# Patient Record
Sex: Female | Born: 1965 | Race: Black or African American | Hispanic: No | Marital: Married | Smoking: Never smoker
Health system: Southern US, Community
[De-identification: ages and names within clinical notes are randomized; demographics above are authoritative.]

## PROBLEM LIST (undated history)

## (undated) DIAGNOSIS — M25569 Pain in unspecified knee: Secondary | ICD-10-CM

## (undated) DIAGNOSIS — R011 Cardiac murmur, unspecified: Secondary | ICD-10-CM

## (undated) DIAGNOSIS — Z972 Presence of dental prosthetic device (complete) (partial): Secondary | ICD-10-CM

## (undated) DIAGNOSIS — Z8739 Personal history of other diseases of the musculoskeletal system and connective tissue: Secondary | ICD-10-CM

## (undated) DIAGNOSIS — E119 Type 2 diabetes mellitus without complications: Secondary | ICD-10-CM

## (undated) DIAGNOSIS — T7840XA Allergy, unspecified, initial encounter: Secondary | ICD-10-CM

## (undated) DIAGNOSIS — K219 Gastro-esophageal reflux disease without esophagitis: Secondary | ICD-10-CM

## (undated) DIAGNOSIS — R51 Headache: Secondary | ICD-10-CM

## (undated) DIAGNOSIS — I10 Essential (primary) hypertension: Secondary | ICD-10-CM

## (undated) DIAGNOSIS — M1990 Unspecified osteoarthritis, unspecified site: Secondary | ICD-10-CM

## (undated) DIAGNOSIS — S83249A Other tear of medial meniscus, current injury, unspecified knee, initial encounter: Secondary | ICD-10-CM

## (undated) DIAGNOSIS — J0391 Acute recurrent tonsillitis, unspecified: Secondary | ICD-10-CM

## (undated) DIAGNOSIS — H269 Unspecified cataract: Secondary | ICD-10-CM

## (undated) DIAGNOSIS — N736 Female pelvic peritoneal adhesions (postinfective): Secondary | ICD-10-CM

## (undated) HISTORY — PX: JOINT REPLACEMENT: SHX530

## (undated) HISTORY — DX: Headache: R51

## (undated) HISTORY — DX: Essential (primary) hypertension: I10

## (undated) HISTORY — PX: POLYPECTOMY: SHX149

## (undated) HISTORY — DX: Allergy, unspecified, initial encounter: T78.40XA

## (undated) HISTORY — PX: LYSIS OF ADHESION: SHX5961

## (undated) HISTORY — DX: Pain in unspecified knee: M25.569

## (undated) HISTORY — DX: Acute recurrent tonsillitis, unspecified: J03.91

## (undated) HISTORY — PX: HERNIA REPAIR: SHX51

## (undated) HISTORY — DX: Female pelvic peritoneal adhesions (postinfective): N73.6

---
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
## 1999-02-09 ENCOUNTER — Encounter: Payer: Self-pay | Admitting: Emergency Medicine

## 1999-02-09 ENCOUNTER — Emergency Department (HOSPITAL_COMMUNITY): Admission: EM | Admit: 1999-02-09 | Discharge: 1999-02-09 | Payer: Self-pay | Admitting: Emergency Medicine

## 1999-04-24 ENCOUNTER — Encounter: Payer: Self-pay | Admitting: Emergency Medicine

## 1999-04-24 ENCOUNTER — Emergency Department (HOSPITAL_COMMUNITY): Admission: EM | Admit: 1999-04-24 | Discharge: 1999-04-24 | Payer: Self-pay | Admitting: Emergency Medicine

## 1999-05-06 ENCOUNTER — Encounter: Admission: RE | Admit: 1999-05-06 | Discharge: 1999-05-21 | Payer: Self-pay | Admitting: Orthopedic Surgery

## 1999-08-05 ENCOUNTER — Other Ambulatory Visit: Admission: RE | Admit: 1999-08-05 | Discharge: 1999-08-05 | Payer: Self-pay | Admitting: Internal Medicine

## 1999-12-15 ENCOUNTER — Emergency Department (HOSPITAL_COMMUNITY): Admission: EM | Admit: 1999-12-15 | Discharge: 1999-12-15 | Payer: Self-pay

## 2000-07-31 ENCOUNTER — Encounter: Admission: RE | Admit: 2000-07-31 | Discharge: 2000-07-31 | Payer: Self-pay | Admitting: Family Medicine

## 2000-08-24 ENCOUNTER — Encounter: Admission: RE | Admit: 2000-08-24 | Discharge: 2000-08-24 | Payer: Self-pay | Admitting: Family Medicine

## 2000-09-18 ENCOUNTER — Encounter: Admission: RE | Admit: 2000-09-18 | Discharge: 2000-09-18 | Payer: Self-pay | Admitting: Family Medicine

## 2001-04-13 ENCOUNTER — Encounter: Admission: RE | Admit: 2001-04-13 | Discharge: 2001-04-13 | Payer: Self-pay | Admitting: Family Medicine

## 2001-05-05 ENCOUNTER — Encounter: Admission: RE | Admit: 2001-05-05 | Discharge: 2001-05-05 | Payer: Self-pay | Admitting: Family Medicine

## 2001-05-07 ENCOUNTER — Encounter: Payer: Self-pay | Admitting: Sports Medicine

## 2001-05-07 ENCOUNTER — Encounter: Admission: RE | Admit: 2001-05-07 | Discharge: 2001-05-07 | Payer: Self-pay | Admitting: *Deleted

## 2001-06-07 ENCOUNTER — Encounter: Admission: RE | Admit: 2001-06-07 | Discharge: 2001-06-07 | Payer: Self-pay | Admitting: Sports Medicine

## 2001-10-26 ENCOUNTER — Encounter: Admission: RE | Admit: 2001-10-26 | Discharge: 2001-10-26 | Payer: Self-pay | Admitting: Family Medicine

## 2001-11-30 ENCOUNTER — Encounter: Admission: RE | Admit: 2001-11-30 | Discharge: 2001-11-30 | Payer: Self-pay | Admitting: Sports Medicine

## 2001-11-30 ENCOUNTER — Encounter (INDEPENDENT_AMBULATORY_CARE_PROVIDER_SITE_OTHER): Payer: Self-pay | Admitting: Specialist

## 2001-12-09 ENCOUNTER — Encounter: Admission: RE | Admit: 2001-12-09 | Discharge: 2001-12-09 | Payer: Self-pay | Admitting: Family Medicine

## 2001-12-17 ENCOUNTER — Encounter: Admission: RE | Admit: 2001-12-17 | Discharge: 2001-12-17 | Payer: Self-pay | Admitting: Family Medicine

## 2002-04-29 ENCOUNTER — Encounter: Admission: RE | Admit: 2002-04-29 | Discharge: 2002-04-29 | Payer: Self-pay | Admitting: Family Medicine

## 2002-05-05 ENCOUNTER — Encounter: Admission: RE | Admit: 2002-05-05 | Discharge: 2002-05-05 | Payer: Self-pay | Admitting: Sports Medicine

## 2002-05-05 ENCOUNTER — Encounter: Payer: Self-pay | Admitting: Sports Medicine

## 2002-05-23 ENCOUNTER — Encounter: Admission: RE | Admit: 2002-05-23 | Discharge: 2002-05-23 | Payer: Self-pay | Admitting: Sports Medicine

## 2002-06-02 ENCOUNTER — Encounter: Admission: RE | Admit: 2002-06-02 | Discharge: 2002-06-02 | Payer: Self-pay | Admitting: Family Medicine

## 2002-06-17 ENCOUNTER — Encounter: Admission: RE | Admit: 2002-06-17 | Discharge: 2002-06-17 | Payer: Self-pay | Admitting: Family Medicine

## 2002-07-09 ENCOUNTER — Emergency Department (HOSPITAL_COMMUNITY): Admission: EM | Admit: 2002-07-09 | Discharge: 2002-07-10 | Payer: Self-pay | Admitting: Emergency Medicine

## 2002-07-09 ENCOUNTER — Encounter: Payer: Self-pay | Admitting: Emergency Medicine

## 2002-07-11 ENCOUNTER — Encounter: Admission: RE | Admit: 2002-07-11 | Discharge: 2002-07-11 | Payer: Self-pay | Admitting: Family Medicine

## 2002-07-27 ENCOUNTER — Encounter: Admission: RE | Admit: 2002-07-27 | Discharge: 2002-07-27 | Payer: Self-pay | Admitting: Family Medicine

## 2002-08-19 ENCOUNTER — Encounter: Admission: RE | Admit: 2002-08-19 | Discharge: 2002-08-19 | Payer: Self-pay | Admitting: General Practice

## 2002-08-19 ENCOUNTER — Encounter: Payer: Self-pay | Admitting: General Practice

## 2002-08-19 ENCOUNTER — Encounter: Admission: RE | Admit: 2002-08-19 | Discharge: 2002-09-20 | Payer: Self-pay | Admitting: General Practice

## 2002-09-16 ENCOUNTER — Encounter: Admission: RE | Admit: 2002-09-16 | Discharge: 2002-09-16 | Payer: Self-pay | Admitting: Family Medicine

## 2002-09-20 ENCOUNTER — Encounter: Admission: RE | Admit: 2002-09-20 | Discharge: 2002-09-20 | Payer: Self-pay | Admitting: *Deleted

## 2002-09-20 ENCOUNTER — Encounter: Payer: Self-pay | Admitting: Sports Medicine

## 2002-12-14 ENCOUNTER — Encounter: Admission: RE | Admit: 2002-12-14 | Discharge: 2002-12-14 | Payer: Self-pay | Admitting: Family Medicine

## 2003-01-12 ENCOUNTER — Encounter: Admission: RE | Admit: 2003-01-12 | Discharge: 2003-01-12 | Payer: Self-pay | Admitting: Family Medicine

## 2003-03-08 ENCOUNTER — Encounter: Payer: Self-pay | Admitting: Family Medicine

## 2003-03-08 ENCOUNTER — Ambulatory Visit (HOSPITAL_COMMUNITY): Admission: RE | Admit: 2003-03-08 | Discharge: 2003-03-08 | Payer: Self-pay | Admitting: Family Medicine

## 2003-04-05 ENCOUNTER — Encounter: Admission: RE | Admit: 2003-04-05 | Discharge: 2003-04-05 | Payer: Self-pay | Admitting: Family Medicine

## 2003-04-07 ENCOUNTER — Encounter: Admission: RE | Admit: 2003-04-07 | Discharge: 2003-04-07 | Payer: Self-pay | Admitting: Sports Medicine

## 2003-04-07 ENCOUNTER — Encounter: Payer: Self-pay | Admitting: Sports Medicine

## 2003-05-10 ENCOUNTER — Encounter (INDEPENDENT_AMBULATORY_CARE_PROVIDER_SITE_OTHER): Payer: Self-pay | Admitting: Specialist

## 2003-05-10 ENCOUNTER — Ambulatory Visit (HOSPITAL_COMMUNITY): Admission: RE | Admit: 2003-05-10 | Discharge: 2003-05-10 | Payer: Self-pay | Admitting: Sports Medicine

## 2003-08-29 ENCOUNTER — Encounter (INDEPENDENT_AMBULATORY_CARE_PROVIDER_SITE_OTHER): Payer: Self-pay | Admitting: *Deleted

## 2003-08-29 LAB — CONVERTED CEMR LAB

## 2003-09-26 ENCOUNTER — Encounter: Admission: RE | Admit: 2003-09-26 | Discharge: 2003-09-26 | Payer: Self-pay | Admitting: Sports Medicine

## 2003-12-06 ENCOUNTER — Encounter: Admission: RE | Admit: 2003-12-06 | Discharge: 2003-12-06 | Payer: Self-pay | Admitting: Family Medicine

## 2004-01-31 ENCOUNTER — Emergency Department (HOSPITAL_COMMUNITY): Admission: EM | Admit: 2004-01-31 | Discharge: 2004-02-01 | Payer: Self-pay | Admitting: Emergency Medicine

## 2004-03-25 ENCOUNTER — Ambulatory Visit: Payer: Self-pay | Admitting: Family Medicine

## 2004-08-19 ENCOUNTER — Ambulatory Visit: Payer: Self-pay | Admitting: Family Medicine

## 2004-09-05 ENCOUNTER — Emergency Department (HOSPITAL_COMMUNITY): Admission: EM | Admit: 2004-09-05 | Discharge: 2004-09-05 | Payer: Self-pay | Admitting: Family Medicine

## 2004-09-10 ENCOUNTER — Ambulatory Visit: Payer: Self-pay | Admitting: Family Medicine

## 2004-11-04 ENCOUNTER — Encounter: Admission: RE | Admit: 2004-11-04 | Discharge: 2004-11-04 | Payer: Self-pay | Admitting: Sports Medicine

## 2005-02-21 ENCOUNTER — Ambulatory Visit: Payer: Self-pay | Admitting: Family Medicine

## 2005-04-30 ENCOUNTER — Ambulatory Visit: Payer: Self-pay | Admitting: Family Medicine

## 2005-05-01 ENCOUNTER — Encounter: Admission: RE | Admit: 2005-05-01 | Discharge: 2005-05-01 | Payer: Self-pay | Admitting: Sports Medicine

## 2005-05-12 ENCOUNTER — Ambulatory Visit: Payer: Self-pay | Admitting: Family Medicine

## 2005-05-28 ENCOUNTER — Ambulatory Visit: Payer: Self-pay | Admitting: Family Medicine

## 2005-06-29 ENCOUNTER — Emergency Department (HOSPITAL_COMMUNITY): Admission: EM | Admit: 2005-06-29 | Discharge: 2005-06-29 | Payer: Self-pay | Admitting: Family Medicine

## 2005-07-06 ENCOUNTER — Emergency Department (HOSPITAL_COMMUNITY): Admission: EM | Admit: 2005-07-06 | Discharge: 2005-07-06 | Payer: Self-pay | Admitting: Emergency Medicine

## 2005-08-28 ENCOUNTER — Inpatient Hospital Stay (HOSPITAL_COMMUNITY): Admission: RE | Admit: 2005-08-28 | Discharge: 2005-08-31 | Payer: Self-pay | Admitting: Obstetrics

## 2005-08-28 ENCOUNTER — Encounter (INDEPENDENT_AMBULATORY_CARE_PROVIDER_SITE_OTHER): Payer: Self-pay | Admitting: Specialist

## 2005-08-28 HISTORY — PX: UNILATERAL SALPINGECTOMY: SHX6160

## 2005-08-28 HISTORY — PX: TOTAL ABDOMINAL HYSTERECTOMY: SHX209

## 2005-11-05 ENCOUNTER — Encounter: Admission: RE | Admit: 2005-11-05 | Discharge: 2005-11-05 | Payer: Self-pay | Admitting: Family Medicine

## 2006-03-12 ENCOUNTER — Ambulatory Visit: Payer: Self-pay | Admitting: Family Medicine

## 2006-03-25 ENCOUNTER — Ambulatory Visit: Payer: Self-pay | Admitting: Family Medicine

## 2006-06-18 ENCOUNTER — Ambulatory Visit: Payer: Self-pay | Admitting: Family Medicine

## 2006-08-17 ENCOUNTER — Ambulatory Visit: Payer: Self-pay | Admitting: Family Medicine

## 2006-08-27 DIAGNOSIS — E049 Nontoxic goiter, unspecified: Secondary | ICD-10-CM | POA: Insufficient documentation

## 2006-08-27 DIAGNOSIS — J309 Allergic rhinitis, unspecified: Secondary | ICD-10-CM | POA: Insufficient documentation

## 2006-08-27 DIAGNOSIS — I10 Essential (primary) hypertension: Secondary | ICD-10-CM | POA: Insufficient documentation

## 2006-08-28 ENCOUNTER — Encounter (INDEPENDENT_AMBULATORY_CARE_PROVIDER_SITE_OTHER): Payer: Self-pay | Admitting: *Deleted

## 2006-10-06 ENCOUNTER — Telehealth: Payer: Self-pay | Admitting: *Deleted

## 2006-10-06 ENCOUNTER — Ambulatory Visit: Payer: Self-pay | Admitting: Family Medicine

## 2006-10-07 ENCOUNTER — Telehealth: Payer: Self-pay | Admitting: *Deleted

## 2006-10-09 ENCOUNTER — Telehealth: Payer: Self-pay | Admitting: *Deleted

## 2006-10-09 ENCOUNTER — Encounter: Payer: Self-pay | Admitting: *Deleted

## 2006-10-09 ENCOUNTER — Ambulatory Visit: Payer: Self-pay | Admitting: Family Medicine

## 2006-10-10 ENCOUNTER — Emergency Department (HOSPITAL_COMMUNITY): Admission: EM | Admit: 2006-10-10 | Discharge: 2006-10-10 | Payer: Self-pay | Admitting: Family Medicine

## 2006-10-12 ENCOUNTER — Telehealth: Payer: Self-pay | Admitting: *Deleted

## 2006-10-28 ENCOUNTER — Ambulatory Visit: Payer: Self-pay | Admitting: Family Medicine

## 2006-10-28 LAB — CONVERTED CEMR LAB
KOH Prep: NEGATIVE
Whiff Test: NEGATIVE

## 2006-11-09 ENCOUNTER — Encounter (INDEPENDENT_AMBULATORY_CARE_PROVIDER_SITE_OTHER): Payer: Self-pay | Admitting: Family Medicine

## 2006-11-09 ENCOUNTER — Encounter: Admission: RE | Admit: 2006-11-09 | Discharge: 2006-11-09 | Payer: Self-pay | Admitting: Sports Medicine

## 2006-11-12 ENCOUNTER — Emergency Department (HOSPITAL_COMMUNITY): Admission: EM | Admit: 2006-11-12 | Discharge: 2006-11-12 | Payer: Self-pay | Admitting: Family Medicine

## 2007-01-28 ENCOUNTER — Telehealth (INDEPENDENT_AMBULATORY_CARE_PROVIDER_SITE_OTHER): Payer: Self-pay | Admitting: Family Medicine

## 2007-02-24 ENCOUNTER — Encounter: Admission: RE | Admit: 2007-02-24 | Discharge: 2007-02-24 | Payer: Self-pay | Admitting: Family Medicine

## 2007-02-24 ENCOUNTER — Encounter (INDEPENDENT_AMBULATORY_CARE_PROVIDER_SITE_OTHER): Payer: Self-pay | Admitting: Family Medicine

## 2007-02-24 ENCOUNTER — Ambulatory Visit: Payer: Self-pay | Admitting: Family Medicine

## 2007-02-24 LAB — CONVERTED CEMR LAB
ALT: 9 units/L (ref 0–35)
AST: 12 units/L (ref 0–37)
Albumin: 3.8 g/dL (ref 3.5–5.2)
Alkaline Phosphatase: 56 units/L (ref 39–117)
BUN: 12 mg/dL (ref 6–23)
Basophils Absolute: 0 10*3/uL (ref 0.0–0.1)
Basophils Relative: 0 % (ref 0–1)
Bilirubin Urine: NEGATIVE
CO2: 26 meq/L (ref 19–32)
Calcium: 8.8 mg/dL (ref 8.4–10.5)
Chloride: 105 meq/L (ref 96–112)
Creatinine, Ser: 0.82 mg/dL (ref 0.40–1.20)
Eosinophils Absolute: 0.1 10*3/uL (ref 0.0–0.7)
Eosinophils Relative: 2 % (ref 0–5)
Glucose, Bld: 100 mg/dL — ABNORMAL HIGH (ref 70–99)
Glucose, Urine, Semiquant: NEGATIVE
HCT: 37.1 % (ref 36.0–46.0)
Hemoglobin: 11.8 g/dL — ABNORMAL LOW (ref 12.0–15.0)
Ketones, urine, test strip: NEGATIVE
Lymphocytes Relative: 43 % (ref 12–46)
Lymphs Abs: 2.2 10*3/uL (ref 0.7–3.3)
MCHC: 31.8 g/dL (ref 30.0–36.0)
MCV: 81.4 fL (ref 78.0–100.0)
Monocytes Absolute: 0.6 10*3/uL (ref 0.2–0.7)
Monocytes Relative: 12 % — ABNORMAL HIGH (ref 3–11)
Neutro Abs: 2.1 10*3/uL (ref 1.7–7.7)
Neutrophils Relative %: 42 % — ABNORMAL LOW (ref 43–77)
Nitrite: NEGATIVE
Platelets: 349 10*3/uL (ref 150–400)
Potassium: 3.8 meq/L (ref 3.5–5.3)
Protein, U semiquant: 30
RBC: 4.56 M/uL (ref 3.87–5.11)
RDW: 14.3 % — ABNORMAL HIGH (ref 11.5–14.0)
Sodium: 141 meq/L (ref 135–145)
Specific Gravity, Urine: 1.02
Total Bilirubin: 0.5 mg/dL (ref 0.3–1.2)
Total Protein: 6.8 g/dL (ref 6.0–8.3)
Urobilinogen, UA: 0.2
WBC Urine, dipstick: NEGATIVE
WBC: 5 10*3/uL (ref 4.0–10.5)
pH: 6.5

## 2007-02-25 ENCOUNTER — Telehealth (INDEPENDENT_AMBULATORY_CARE_PROVIDER_SITE_OTHER): Payer: Self-pay | Admitting: Family Medicine

## 2007-03-01 ENCOUNTER — Emergency Department (HOSPITAL_COMMUNITY): Admission: EM | Admit: 2007-03-01 | Discharge: 2007-03-01 | Payer: Self-pay | Admitting: Family Medicine

## 2007-03-02 ENCOUNTER — Ambulatory Visit: Payer: Self-pay | Admitting: Family Medicine

## 2007-03-02 DIAGNOSIS — N83209 Unspecified ovarian cyst, unspecified side: Secondary | ICD-10-CM | POA: Insufficient documentation

## 2007-03-03 ENCOUNTER — Encounter: Admission: RE | Admit: 2007-03-03 | Discharge: 2007-03-03 | Payer: Self-pay | Admitting: Family Medicine

## 2007-03-04 ENCOUNTER — Telehealth (INDEPENDENT_AMBULATORY_CARE_PROVIDER_SITE_OTHER): Payer: Self-pay | Admitting: Family Medicine

## 2007-03-05 ENCOUNTER — Telehealth: Payer: Self-pay | Admitting: *Deleted

## 2007-03-05 ENCOUNTER — Encounter (INDEPENDENT_AMBULATORY_CARE_PROVIDER_SITE_OTHER): Payer: Self-pay | Admitting: Family Medicine

## 2007-03-25 ENCOUNTER — Encounter: Payer: Self-pay | Admitting: Obstetrics

## 2007-03-25 ENCOUNTER — Ambulatory Visit (HOSPITAL_COMMUNITY): Admission: RE | Admit: 2007-03-25 | Discharge: 2007-03-25 | Payer: Self-pay | Admitting: Obstetrics

## 2007-03-25 HISTORY — PX: CYST EXCISION: SHX5701

## 2007-04-13 ENCOUNTER — Telehealth: Payer: Self-pay | Admitting: *Deleted

## 2007-04-14 ENCOUNTER — Ambulatory Visit: Payer: Self-pay | Admitting: Family Medicine

## 2007-07-01 HISTORY — PX: COLONOSCOPY: SHX174

## 2007-09-01 ENCOUNTER — Ambulatory Visit: Payer: Self-pay | Admitting: Family Medicine

## 2007-09-07 ENCOUNTER — Ambulatory Visit: Payer: Self-pay | Admitting: Gastroenterology

## 2007-09-10 ENCOUNTER — Ambulatory Visit: Payer: Self-pay | Admitting: Gastroenterology

## 2007-09-10 ENCOUNTER — Encounter (INDEPENDENT_AMBULATORY_CARE_PROVIDER_SITE_OTHER): Payer: Self-pay | Admitting: Family Medicine

## 2007-10-11 ENCOUNTER — Ambulatory Visit: Payer: Self-pay | Admitting: Gastroenterology

## 2007-11-10 ENCOUNTER — Encounter: Admission: RE | Admit: 2007-11-10 | Discharge: 2007-11-10 | Payer: Self-pay | Admitting: Family Medicine

## 2007-11-23 ENCOUNTER — Encounter (INDEPENDENT_AMBULATORY_CARE_PROVIDER_SITE_OTHER): Payer: Self-pay | Admitting: Family Medicine

## 2008-07-26 ENCOUNTER — Encounter: Payer: Self-pay | Admitting: Family Medicine

## 2008-09-20 ENCOUNTER — Ambulatory Visit: Payer: Self-pay | Admitting: Family Medicine

## 2008-11-08 ENCOUNTER — Ambulatory Visit: Payer: Self-pay | Admitting: Family Medicine

## 2008-11-13 ENCOUNTER — Encounter: Admission: RE | Admit: 2008-11-13 | Discharge: 2008-11-13 | Payer: Self-pay | Admitting: Family Medicine

## 2008-11-13 ENCOUNTER — Ambulatory Visit: Payer: Self-pay | Admitting: Family Medicine

## 2008-11-13 DIAGNOSIS — M2140 Flat foot [pes planus] (acquired), unspecified foot: Secondary | ICD-10-CM | POA: Insufficient documentation

## 2008-12-04 ENCOUNTER — Ambulatory Visit: Payer: Self-pay | Admitting: Family Medicine

## 2008-12-14 ENCOUNTER — Telehealth: Payer: Self-pay | Admitting: Family Medicine

## 2008-12-15 ENCOUNTER — Ambulatory Visit: Payer: Self-pay | Admitting: Family Medicine

## 2008-12-18 ENCOUNTER — Ambulatory Visit: Payer: Self-pay | Admitting: Family Medicine

## 2009-01-09 ENCOUNTER — Encounter: Payer: Self-pay | Admitting: Family Medicine

## 2009-01-11 ENCOUNTER — Telehealth: Payer: Self-pay | Admitting: *Deleted

## 2009-01-17 ENCOUNTER — Ambulatory Visit: Payer: Self-pay | Admitting: Family Medicine

## 2009-01-17 ENCOUNTER — Encounter: Payer: Self-pay | Admitting: Family Medicine

## 2009-01-17 DIAGNOSIS — K219 Gastro-esophageal reflux disease without esophagitis: Secondary | ICD-10-CM | POA: Insufficient documentation

## 2009-01-17 LAB — CONVERTED CEMR LAB
BUN: 11 mg/dL (ref 6–23)
CO2: 30 meq/L (ref 19–32)
Calcium: 9.1 mg/dL (ref 8.4–10.5)
Chloride: 102 meq/L (ref 96–112)
Creatinine, Ser: 1.03 mg/dL (ref 0.40–1.20)
Glucose, Bld: 103 mg/dL — ABNORMAL HIGH (ref 70–99)
Potassium: 3.9 meq/L (ref 3.5–5.3)
Sodium: 141 meq/L (ref 135–145)

## 2009-04-12 ENCOUNTER — Ambulatory Visit: Payer: Self-pay | Admitting: Family Medicine

## 2009-04-16 ENCOUNTER — Telehealth: Payer: Self-pay | Admitting: *Deleted

## 2009-05-18 ENCOUNTER — Ambulatory Visit: Payer: Self-pay | Admitting: Family Medicine

## 2009-06-30 HISTORY — PX: KNEE ARTHROSCOPY: SUR90

## 2009-07-30 ENCOUNTER — Ambulatory Visit: Payer: Self-pay | Admitting: Sports Medicine

## 2009-07-30 DIAGNOSIS — M25569 Pain in unspecified knee: Secondary | ICD-10-CM | POA: Insufficient documentation

## 2009-07-30 HISTORY — DX: Pain in unspecified knee: M25.569

## 2009-08-13 ENCOUNTER — Ambulatory Visit: Payer: Self-pay | Admitting: Family Medicine

## 2009-08-17 ENCOUNTER — Ambulatory Visit (HOSPITAL_COMMUNITY): Admission: RE | Admit: 2009-08-17 | Discharge: 2009-08-17 | Payer: Self-pay | Admitting: Family Medicine

## 2009-08-24 ENCOUNTER — Ambulatory Visit: Payer: Self-pay | Admitting: Family Medicine

## 2009-08-29 ENCOUNTER — Ambulatory Visit: Payer: Self-pay | Admitting: Family Medicine

## 2009-08-29 ENCOUNTER — Telehealth: Payer: Self-pay | Admitting: Family Medicine

## 2009-10-22 ENCOUNTER — Ambulatory Visit: Payer: Self-pay | Admitting: Family Medicine

## 2009-11-12 ENCOUNTER — Encounter: Payer: Self-pay | Admitting: Family Medicine

## 2009-11-12 ENCOUNTER — Ambulatory Visit: Payer: Self-pay | Admitting: Family Medicine

## 2009-11-12 ENCOUNTER — Telehealth: Payer: Self-pay | Admitting: Family Medicine

## 2009-11-12 LAB — CONVERTED CEMR LAB
BUN: 16 mg/dL (ref 6–23)
CO2: 30 meq/L (ref 19–32)
Calcium: 8.8 mg/dL (ref 8.4–10.5)
Chloride: 100 meq/L (ref 96–112)
Creatinine, Ser: 0.96 mg/dL (ref 0.40–1.20)
Glucose, Bld: 147 mg/dL — ABNORMAL HIGH (ref 70–99)
Magnesium: 2 mg/dL (ref 1.5–2.5)
Potassium: 3.7 meq/L (ref 3.5–5.3)
Sodium: 139 meq/L (ref 135–145)

## 2009-11-14 ENCOUNTER — Encounter: Admission: RE | Admit: 2009-11-14 | Discharge: 2009-11-14 | Payer: Self-pay | Admitting: Family Medicine

## 2009-11-14 ENCOUNTER — Telehealth: Payer: Self-pay | Admitting: Family Medicine

## 2009-12-06 ENCOUNTER — Telehealth: Payer: Self-pay | Admitting: Family Medicine

## 2009-12-07 ENCOUNTER — Ambulatory Visit: Payer: Self-pay | Admitting: Family Medicine

## 2009-12-07 ENCOUNTER — Encounter: Payer: Self-pay | Admitting: Family Medicine

## 2009-12-08 LAB — CONVERTED CEMR LAB
BUN: 14 mg/dL (ref 6–23)
CO2: 30 meq/L (ref 19–32)
Calcium: 8.9 mg/dL (ref 8.4–10.5)
Chloride: 102 meq/L (ref 96–112)
Creatinine, Ser: 1.01 mg/dL (ref 0.40–1.20)
Glucose, Bld: 105 mg/dL — ABNORMAL HIGH (ref 70–99)
Magnesium: 1.8 mg/dL (ref 1.5–2.5)
Potassium: 4 meq/L (ref 3.5–5.3)
Sodium: 140 meq/L (ref 135–145)

## 2009-12-13 ENCOUNTER — Encounter: Payer: Self-pay | Admitting: Family Medicine

## 2009-12-28 ENCOUNTER — Ambulatory Visit: Payer: Self-pay | Admitting: Family Medicine

## 2010-01-08 ENCOUNTER — Encounter: Payer: Self-pay | Admitting: Family Medicine

## 2010-01-21 ENCOUNTER — Encounter: Payer: Self-pay | Admitting: Family Medicine

## 2010-05-20 ENCOUNTER — Ambulatory Visit: Payer: Self-pay | Admitting: Family Medicine

## 2010-05-22 ENCOUNTER — Encounter: Payer: Self-pay | Admitting: Family Medicine

## 2010-05-30 ENCOUNTER — Encounter (INDEPENDENT_AMBULATORY_CARE_PROVIDER_SITE_OTHER): Payer: Self-pay | Admitting: *Deleted

## 2010-07-04 ENCOUNTER — Ambulatory Visit: Admission: RE | Admit: 2010-07-04 | Discharge: 2010-07-04 | Payer: Self-pay | Source: Home / Self Care

## 2010-07-21 ENCOUNTER — Encounter: Payer: Self-pay | Admitting: Sports Medicine

## 2010-07-21 ENCOUNTER — Encounter: Payer: Self-pay | Admitting: Family Medicine

## 2010-07-30 NOTE — Assessment & Plan Note (Signed)
Summary: FU LEFT KNEE   Vital Signs:  Patient profile:   45 year old female BP sitting:   137 / 91  Vitals Entered By: Lillia Pauls CMA (August 13, 2009 4:24 PM)  Primary Care Provider:  Lequita Asal  MD   History of Present Illness: continued knee painl eft--has given way at least twice. sharp pain when that happens  Allergies: No Known Drug Allergies  Physical Exam  Msk:  medial joint line tenderness. no effusion. equivocal mcMurray ligamentously intact   Impression & Recommendations:  Problem # 1:  KNEE PAIN, LEFT, ACUTE (ICD-719.46)  cell phone [phone] cell  Her updated medication list for this problem includes:    Ibuprofen 800 Mg Tabs (Ibuprofen) ..... One tab by mouth q8 as needed pain  Orders: MRI without Contrast (MRI w/o Contrast)  Complete Medication List: 1)  Hydrochlorothiazide 25 Mg Tabs (Hydrochlorothiazide) .... One tablet by mouth qday 2)  Flonase 50 Mcg/act Susp (Fluticasone propionate) .[name].. 1 spray each nostril daily 3)  Afrin Nasal Spray 0.05 % Soln (Oxymetazoline hcl) .... 3 sprays each nostril twice a day x 3 days maximum 4)  Ibuprofen 800 Mg Tabs (Ibuprofen) .... One tab by mouth q8 as needed pain  Patient Instructions: 1)  MRI ON FEB 17TH AT 7PM AT [facility]

## 2010-07-30 NOTE — Assessment & Plan Note (Signed)
Summary: F/U,MC   Vital Signs:  Patient profile:   45 year old female Height:      66 inches Weight:      250 pounds BMI:     40.50 BP sitting:   137 / 95  Vitals Entered By: Lillia Pauls CMA (October 22, 2009 3:26 PM)  Primary Care Provider:  Lequita Asal  MD   History of Present Illness: f/u left knee pain--MRi we did showed thinning meniscus. I gace her shot in march and that helped for 8 weeks--now starting to bother her again. Has started elliptical machine and that is going well. Using ibuprofen when she has to.  Allergies (verified): No Known Drug Allergies  Physical Exam  Msk:  left knee medial join line tenderness. no effusion. + crepitus. Ligamentously intact. calf is soft Additional Exam:  Patient given informed consent for injection. Discussed possible complications of infection, bleeding or skin atrophy at site of injection. Possible side effect of avascular necrosis (focal area of bone death) due to steroid use.Appropriate verbal time out taken Are cleaned and prepped in usual sterile fashion. A --1-- cc kennalog plus --4--cc 1% lidocaine without epinephrine was injected into the-left knee using anterior approach--. Patient tolerated procedure well with no complications.    Impression & Recommendations:  Problem # 1:  KNEE PAIN, LEFT, CHRONIC (ICD-719.46)  Her updated medication list for this problem includes:    Ibuprofen 800 Mg Tabs (Ibuprofen) ..... One tab by mouth q8 as needed pain thinned mensicus with no overt tear on MRi she responded to injection therapy but only got about 2 m relief. we discussed--would give inj today but in future we wil space out at least 3 m in between shots. congratulated on elliptical machine use--4 pound wt loss. rtc 3 m or as needed.  Orders: Joint Aspirate / Injection, Large ([id number])  Complete Medication List: 1)  Hydrochlorothiazide 25 Mg Tabs (Hydrochlorothiazide) .... One tablet by mouth qday 2)  Ibuprofen 800 Mg Tabs  (Ibuprofen) .... One tab by mouth q8 as needed pain

## 2010-07-30 NOTE — Progress Notes (Signed)
Summary: phn msg   Phone Note Call from Patient Call back at [phone]   Caller: Patient Summary of Call: having cramps in legs and feet and wants to know what she can do for it  Initial call taken by: De Nurse,  Nov 12, 2009 9:02 AM  Follow-up for Phone Call        R leg & both feet. happens mostly during night. also does happen during the day. states she has been eating bananas & drinking juice to no avail. appt at 3:30. she is at work & cannot come in sooner. Follow-up by: Golden Circle RN,  Nov 12, 2009 9:05 AM

## 2010-07-30 NOTE — Assessment & Plan Note (Signed)
Summary: L leg cramp   Vital Signs:  Patient profile:   45 year old female Height:      66 inches Weight:      257.2 pounds BMI:     41.66 Temp:     98.9 degrees F oral Pulse rate:   76 / minute BP sitting:   138 / 85  (left arm) Cuff size:   large  Vitals Entered By: Gladstone Pih (December 07, 2009 8:54 AM) CC: C/O leg cramps in left upper thigh on Wed Is Patient Diabetic? No Comments has had cramping in right foot and calf in the past   Primary Care Provider:  Lequita Asal  MD  CC:  C/O leg cramps in left upper thigh on Wed.  History of Present Illness: 45yo F c/o recurrent leg cramps.  Leg cramps:  > 1 month.  Was treated by Dr. Lelon Perla with K supplementation and instructions to remain hydrated  and states that symptoms of the right leg improved and over the course of the past 3 days has developed night time cramps of the left leg. Usually lasts only a few minutes, starts in the thigh and goes down into her calf and foot.  Known in the past to be exercising more  and sweating a lot when working out.  She reports drinking adequate water and urinating often.      ROS: denies leg swelling, redness, fevers    Habits & Providers  Alcohol-Tobacco-Diet     Tobacco Status: never  Current Medications (verified): 1)  Hydrochlorothiazide 25 Mg Tabs (Hydrochlorothiazide) .... One Tablet By Mouth Qday 2)  Ibuprofen 800 Mg Tabs (Ibuprofen) .... One Tab By Mouth Q8 As Needed Pain 3)  Flonase 50 Mcg/act Susp (Fluticasone Propionate) .... One Spray in Each Nostril Daily. 4)  Omeprazole 20 Mg Tbec (Omeprazole) .... Take 1 Tab By Mouth Daily For Heartburn 5)  Klor-Con 10 10 Meq Cr-Tabs (Potassium Chloride) .... 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 Well appearing obese F, NAD.  Pulses:  2+ dp equal b/l Extremities:  L leg exam: Inspection- no obvious deformites, erythema, edema, or ecchymosis; symmetric to right leg Palpation- no ttp ROM- full ROM of all joints Neg Homan's    Impression & Recommendations:  Problem # 1:  LEG CRAMPS (ICD-729.82) Assessment Deteriorated Recurrent leg cramps but now on left leg. No signs or symptoms of DVT. She has good circulation. She seemed to improve last month after K supplementation...she has always had low nl K levels.  Will recheck today, cont on current K supplementation, and consider changing HTN med in case HCTZ is contributing to low K.  Will defer med changes to Dr. Lanier Prude.  Orders: Basic Met-[facility] [phone]) Magnesium-[facility] [phone]) [facility]- Est Level  3 ([id number])  Complete Medication List: 1)  Hydrochlorothiazide 25 Mg Tabs (Hydrochlorothiazide) .... One tablet by mouth qday 2)  Ibuprofen 800 Mg Tabs (Ibuprofen) .... One tab by mouth q8 as needed pain 3)  Flonase 50 Mcg/act Susp (Fluticasone propionate) .... One spray in each nostril daily. 4)  Omeprazole 20 Mg Tbec (Omeprazole) .... Take 1 tab by mouth daily  for heartburn 5)  Klor-con 10 10 Meq Cr-tabs (Potassium chloride) .... Take 1 tab by mouth daily for cramps  Patient Instructions: 1)  Please schedule a follow-up appointment in 2 weeks if no improvement. 2)  We will check your K and renal function today along with the magnesium.  I'll call you with the results. 3)  For now continue with the potassium supplements and continue with the hydration. 4)  3-4 hours before bedtime, take the ibuprofen

## 2010-07-30 NOTE — Miscellaneous (Signed)
   Clinical Lists Changes  Problems: Removed problem of LEG CRAMPS (ICD-729.82) Removed problem of UPPER RESPIRATORY INFECTION (ICD-465.9) Removed problem of OTITIS MEDIA, SEROUS, ACUTE, BILATERAL (ICD-381.01)

## 2010-07-30 NOTE — Consult Note (Signed)
Summary: [facility]  [facility]   Imported By: Clydell Hakim 02/08/2010 11:23:36  _____________________________________________________________________  External Attachment:    Type:   Image     Comment:   External Document

## 2010-07-30 NOTE — Assessment & Plan Note (Signed)
Summary: F/U L KNEE,MC   Vital Signs:  Patient profile:   45 year old female BP sitting:   127 / 86  Vitals Entered By: Lillia Pauls CMA (December 28, 2009 11:12 AM)  Primary Care Provider:  Lequita Asal  MD   History of Present Illness: Reports to f/u left knee pain. Unchanged since LOV. Corticosteroid injection helped for 2 weeks. Pain worsened on prolonged activities and relieved by rest. Persistent swelling. Occasionally takes advil which somewhat helps relieve pain. Endorses true intermittent left knee locking. Some painless popping as well. No signs of instability. No past left knee injuries or procedures.  Allergies: No Known Drug Allergies PMH-FH-SH reviewed for relevance  Physical Exam  General:  Well-developed,well-nourished,in no acute distress; alert,appropriate and cooperative throughout examination Msk:  LEFT KNEE: Mild diffuse swelilng. No signs of infection. Normal nv exam. Med jt line ttp. (+)pain on flexion phase of McMurray's w/o clunk. No ligamentous instability. Genu valgum   Impression & Recommendations:  Problem # 1:  KNEE PAIN, LEFT, CHRONIC (ICD-719.46) Assessment Unchanged  - d/c advil/ibuprofen. - start relafen. - patella stablizer brace only for prolonged ambulatory activities or exercise. - opts for orthopedic consultation re: potential arthroscopy. will refer per patient request.  Orders: Patella / Knee brace ([id number])  Complete Medication List: 1)  Hydrochlorothiazide 25 Mg Tabs (Hydrochlorothiazide) .... One tablet by mouth qday 2)  Flonase 50 Mcg/act Susp (Fluticasone propionate) .... One spray in each nostril daily. 3)  Omeprazole 20 Mg Tbec (Omeprazole) .... Take 1 tab by mouth daily for heartburn 4)  Klor-con 10 10 Meq Cr-tabs (Potassium chloride) .... Take 1 tab by mouth daily for cramps 5)  Nabumetone 500 Mg Tabs (Nabumetone) .[name].. 1 tab by mouth with food q 12 hrs  Patient Instructions: 1)  DR Jodi Geralds. JULY12TH  TUES AT 4PM. [address] Prescriptions: NABUMETONE 500 MG TABS (NABUMETONE) 1 tab by mouth with food q 12 hrs  #60 x 0   Entered and Authorized by:   Valarie Merino MD   Signed by:   Valarie Merino MD on 12/28/2009   Method used:   [facility]        [facility]  [address] [phone]* (retail)       [address]       [place], [place]  [address]       Ph: [phone] or [phone]       Fax: [phone]   RxID:   [id number]

## 2010-07-30 NOTE — Consult Note (Signed)
Summary: [facility]  [facility]   Imported By: Clydell Hakim 01/22/2010 16:39:05  _____________________________________________________________________  External Attachment:    Type:   Image     Comment:   External Document

## 2010-07-30 NOTE — Assessment & Plan Note (Signed)
Summary: ear pain/[place]   Vital Signs:  Patient profile:   45 year old female Weight:      254.3 pounds Temp:     98.5 degrees F Pulse rate:   81 / minute BP sitting:   134 / 88  Vitals Entered By: Loralee Pacas CMA (August 29, 2009 2:00 PM)  CC:  Earache.  Acute Visit History:      The patient complains of cough, earache, and sore throat.  These symptoms began 5 days ago.  She denies eye symptoms, fever, headache, and nasal discharge.  Other comments include: URI symptoms and congestion since last thursday, ear pain started saturday. has tried mucinex-D with some relief. no headaches, SOB, fever, chills, CP. +sick contacts. similar symptoms 3 yrs ago. ultimately required ENT eval, dx'ed with serous otitis media. .        The character of the cough is described as nonproductive.  She has no history of COPD.  There is no history of wheezing, sleep interference, shortness of breath, respiratory retractions, tachypnea, cyanosis, or interference with oral intake associated with her cough.        The earache is located on the right side.  There have been 'cold' or URI symptoms associated with the earache.  There is no history of recent antibiotic usage or recurrent otitis media associated with the earache.        'Cold' or URI symptoms have been present with the sore throat.  There is no history of dysphagia, drooling, or recent exposure to strep.        Current Medications (verified): 1)  Hydrochlorothiazide 25 Mg Tabs (Hydrochlorothiazide) .... One Tablet By Mouth Qday 2)  Ibuprofen 800 Mg Tabs (Ibuprofen) .... One Tab By Mouth Q8 As Needed Pain  Allergies (verified): No Known Drug Allergies  Physical Exam  General:  obese female. NAD. vitals reviewed.  Head:  no TTP of frontal or maxillary sinuses bilaterally Eyes:  EOMI, PERRLA, no scleral icterus Ears:  hearing grossly normal bilaterally. bilateral TMs with serous fluid posteriorly. no erythema, bulging, retraction Nose:  no  rhinorrhea Mouth:  Oral mucosa and oropharynx without lesions or exudates.  Teeth in good repair. Neck:  no lymphadenopathy Lungs:  Normal respiratory effort, chest expands symmetrically. Lungs are clear to auscultation, no crackles or wheezes. Heart:  Normal rate and regular rhythm. S1 and S2 normal without gallop, murmur, click, rub or other extra sounds.   Impression & Recommendations:  Problem # 1:  OTITIS MEDIA, SEROUS, ACUTE, BILATERAL (ICD-381.01) Assessment Deteriorated  continue decongestant medications. patient to restart antihistamine such as claritin or zyrtec. in past, required allegra-D. patient to use afrin spray for 3-5 days.   Orders: [facility]- Est Level  3 ([id number])  Problem # 2:  UPPER RESPIRATORY INFECTION (ICD-465.9) Assessment: Deteriorated  supportive care only. likely viral, so no antibiotics at this point.   Her updated medication list for this problem includes:    Ibuprofen 800 Mg Tabs (Ibuprofen) ..... One tab by mouth q8 as needed pain  Orders: [facility]- Est Level  3 (43329)

## 2010-07-30 NOTE — Progress Notes (Signed)
Summary: phn msg   Phone Note Call from Patient Call back at [phone]   Caller: Patient Summary of Call: needs to talk to nurse about cramps in feet/legs Initial call taken by: De Nurse,  December 06, 2009 11:37 AM  Follow-up for Phone Call        this has been happening lately. unable to see her today. appt made for 8:30am tomorrow. told her if worse & cannot wait, may use UC. she thinks she can wait until am Follow-up by: Golden Circle RN,  December 06, 2009 11:58 AM

## 2010-07-30 NOTE — Letter (Signed)
Summary: Generic Letter  [facility]  [address]   [place], [place] [address]   Phone: [phone]  Fax: [phone]    05/22/2010 MRN: [id number]  [address] [place], [place]  [address]  Dear Ms. Quinlivan,  At the end of our last visit we got that finger stick to take a look at your Hemoglobin A1c. This is a blood test that tells [place] how well your blood sugar has been controlled over the last 3 monthes. The normal value is up to 6, your value was up to 6.4.  This likely means that you have borderline type 2 diabetes.  please call the family medicine clinic and schedule an appointment so we can talk about what your options are to avoid long term medication and long term complications from uncontrolled blood sugar.    Sincerely,   Edd Arbour MD [facility]  Appended Document: Generic Letter mailed

## 2010-07-30 NOTE — Miscellaneous (Signed)
  Clinical Lists Changes Medical Record Request Received medical record request Records went to [facility] Faxed on 05/04/2009 [facility] Pysher  May 30, 2010 10:00 AM

## 2010-07-30 NOTE — Assessment & Plan Note (Signed)
Summary: L KNEE PAIN,MC   Vital Signs:  Patient profile:   45 year old female BP sitting:   137 / 88  Vitals Entered By: Lillia Pauls CMA (July 30, 2009 4:33 PM)  Primary Care Provider:  Lequita Asal  MD   History of Present Illness: 45 yo previously seen for plantar fasciitis now here with left knee pain of 2 weeks duration.  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  She has noticed some swelling at that time, with popping.  Pain has been worsening because several days later fell onto her knee.  No weakness or  "giving way"- mechanical fall slipped on cardboard.  Pain wakes her up at night.  Has been takin some 800 mg Ibuprofen with some improvement in pain enough to sleep, but no overall improvement.  Most painful walking down stairs.  Notes stiffness after inactivity.    Allergies: No Known Drug Allergies  Physical Exam  Additional Exam:  l knee tender at point area medial frontal joint line. Patellar tendon not involved. Mild crepitus. No effusion. Ligamentously intact. Inconclusive McMurray (pain no pop) calf soft   Impression & Recommendations:  Problem # 1:  KNEE PAIN, LEFT, ACUTE (ICD-719.46) I think this is a small meniscal irritation or tear--will treat conservatively with NSAIDS, rest and ICE. RTC 2 weeks.  Complete Medication List: 1)  Hydrochlorothiazide 25 Mg Tabs (Hydrochlorothiazide) .... One tablet by mouth qday 2)  Flonase 50 Mcg/act Susp (Fluticasone propionate) .[name].. 1 spray each nostril daily 3)  Afrin Nasal Spray 0.05 % Soln (Oxymetazoline hcl) .... 3 sprays each nostril twice a day x 3 days maximum 4)  Ibuprofen 800 Mg Tabs (Ibuprofen) .... One tab by mouth q8 as needed pain

## 2010-07-30 NOTE — Assessment & Plan Note (Signed)
Summary: charley horse/[place]   Vital Signs:  Patient profile:   45 year old female Height:      66 inches Weight:      259 pounds BMI:     41.95 BSA:     2.23 Temp:     98.3 degrees F Pulse rate:   77 / minute BP sitting:   131 / 86  Vitals Entered By: Jone Baseman CMA (Nov 12, 2009 3:41 PM) CC: charley horse x 1 week Is Patient Diabetic? No Pain Assessment Patient in pain? no        Primary Care Provider:  Lequita Asal  MD  CC:  charley horse x 1 week.  History of Present Illness: 1. Leg cramps:  Pt has been having leg cramps for about 1 week.  It occurs at night and usually lasts only a few minutes.  It is always in her right leg and goes down into her calf and foot.  It is never in her other extremities.  She tried mustard on Saturday and that did seem to help.  She has been exercising more recently and has been sweating a lot when working out.  Only drinks 2-3 sixteen ounce bottles a day.  Does not eat a lot of fruits or vegetables      ROS: denies leg swelling, redness, fevers  2. GERD:  Has been having some problems with heart burn over the past couple of months.  She was diagnosed with this a while ago and did take Protonix for it in the past.  She hasn't required anything for  a while but has started to notice more foods are giving her problems.        ROS: she endorses a burning sensation in her chest and an acid taste in her mouth.  Habits & Providers  Alcohol-Tobacco-Diet     Tobacco Status: never  Current Medications (verified): 1)  Hydrochlorothiazide 25 Mg Tabs (Hydrochlorothiazide) .... One Tablet By Mouth Qday 2)  Ibuprofen 800 Mg Tabs (Ibuprofen) .... One Tab By Mouth Q8 As Needed Pain 3)  Flonase 50 Mcg/act Susp (Fluticasone Propionate) .... One Spray in Each Nostril Daily. 4)  Omeprazole 20 Mg Tbec (Omeprazole) .... Take 1 Tab By Mouth Daily For Heartburn 5)  Klor-Con 10 10 Meq Cr-Tabs (Potassium Chloride) .... Take 1 Tab By Mouth Daily For  Cramps  Allergies: No Known Drug Allergies  Past History:  Past Medical History: Reviewed history from 09/07/2007 and no changes required. allergic sinusitis, perimenopausal symptoms  11/02  let ovarian cyst on CT 8/08  bilateral 1-2 mm lung nodules 9/08 , needs f/u CT in 1 yr only if risk factor for bronchogenic carcinoma  Current Problems:  ABDOMINAL PAIN, LEFT LOWER QUADRANT (ICD-789.04) ACUTE SINUSITIS, UNSPECIFIED (ICD-461.9) DERMATITIS, CONTACT, NOS (ICD-692.9) CYST, OVARIAN NEC/NOS (ICD-620.2) ABDOMINAL PAIN, LEFT LOWER QUADRANT (ICD-789.04) SEXUALLY TRANSMITTED DISEASE, EXPOSURE TO (ICD-V01.6) LEUKORRHEA (ICD-623.5) OM, ACUTE SANGUINOUS (ICD-381.03) RHINITIS, ALLERGIC (ICD-477.9) OBESITY, NOS (ICD-278.00) HYPERTENSION, BENIGN SYSTEMIC (ICD-401.1) GOITER NOS (ICD-240.9) CONSTIPATION (ICD-564.0)     Social History: Reviewed history from 01/17/2009 and no changes required. 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 NAD. vitals reviewed.  Mouth:  slightly dry mucous membranes Lungs:  Normal respiratory effort, chest expands symmetrically. Lungs are clear to auscultation, no crackles or wheezes. Heart:  Normal rate and regular rhythm. S1  and S2 normal without gallop, murmur, click, rub or other extra sounds. Abdomen:  soft, non-tender, and normal bowel sounds.   Msk:  normal ROM, no joint tenderness, and no joint swelling.   Extremities:  no lower extremity edema Skin:  turgor normal, color normal, and no rashes.     Impression & Recommendations:  Problem # 1:  LEG CRAMPS (ICD-729.82) Assessment New Likely due to dehydration and possible electrolyte imbalance.  Told her to start drinking a lot more water each day (1 gallon minimum).  Will also check her electrolytes today and start her on a low dose  potassium replacement. Orders: Basic Met-[facility] [phone]) Magnesium-[facility] [id number]) [facility]- Est Level  3 ([id number])  Problem # 2:  GERD (ICD-530.81) Assessment: New  Start Omeprazole. Her updated medication list for this problem includes:    Omeprazole 20 Mg Tbec (Omeprazole) .[name]... Take 1 tab by mouth daily for heartburn  Orders: [facility]- Est Level  3 ([id number])  Complete Medication List: 1)  Hydrochlorothiazide 25 Mg Tabs (Hydrochlorothiazide) .... One tablet by mouth qday 2)  Ibuprofen 800 Mg Tabs (Ibuprofen) .... One tab by mouth q8 as needed pain 3)  Flonase 50 Mcg/act Susp (Fluticasone propionate) .... One spray in each nostril daily. 4)  Omeprazole 20 Mg Tbec (Omeprazole) .... Take 1 tab by mouth daily for heartburn 5)  Klor-con 10 10 Meq Cr-tabs (Potassium chloride) .... Take 1 tab by mouth daily for cramps  Patient Instructions: 1)  Your cramps are probably caused from being dehydrated and from your electrolytes, primarily potassium being out of balance 2)  You need to be drinking more water.  Try and drink 1 gallon or water everyday. 3)  I am going to send in a prescription for some potassium.  Start taking that everyday. 4)  We will check your electrolytes today to make sure that there are none that are way off. 5)  I will also send in a prescription for some heartburn medicine 6)  Please schedule a follow up appointment in 3-4 weeks Prescriptions: KLOR-CON 10 10 MEQ CR-TABS (POTASSIUM CHLORIDE) Take 1 tab by mouth daily for cramps  #30 x 3   Entered and Authorized by:   Angelena Sole MD   Signed by:   Angelena Sole MD on 11/12/2009   Method used:   [facility]        [facility] [phone]* (retail)       [address]       [place], [place]  [address]       Ph: [phone] or [phone]       Fax: [phone]   RxID:   [id number] OMEPRAZOLE 20 MG TBEC (OMEPRAZOLE) Take 1 tab by mouth daily for heartburn  #30 x 3   Entered and  Authorized by:   Angelena Sole MD   Signed by:   Angelena Sole MD on 11/12/2009   Method used:   [facility] to        [facility] [facility] [phone]* (retail)       [address]       [place], [place]  [address]       Ph: [phone] or [phone]       Fax: [phone]   RxID:   [id number]

## 2010-07-30 NOTE — Assessment & Plan Note (Signed)
Summary: L KNEE INJECTION AND CHECK L FOOT   Vital Signs:  Patient profile:   45 year old female BP sitting:   119 / 90  Vitals Entered By: Lillia Pauls CMA (August 24, 2009 9:09 AM)  Primary Care Provider:  Lequita Asal  MD   History of Present Illness: f/u left knee pain, had MRi also continued left plantar foot pain even tho she is wearing orthotics  knee pain worse w stairs, has to go up and down sideways. some occasional swelling but not a lot. No warmth or erythema. has questions about best exercise equipment for her (elliptical is what she is looking at)  Allergies: No Known Drug Allergies  Physical Exam  General:  overweight-appearing.   Msk:  some tenderness medial joint line and superior pole of patella. full extension and flexion. no effusion   Additional Exam:  MRI reviewed. mensici normal w no sign of tear. Slight joint effusion. thinning of medial menisus.    Impression & Recommendations:  Problem # 1:  KNEE PAIN, LEFT, ACUTE (ICD-719.46)  Her updated medication list for this problem includes:    Ibuprofen 800 Mg Tabs (Ibuprofen) ..... One tab by mouth q8 as needed pain  Orders: Kenalog 10 mg inj (Z6109) we discussed MRI. Will start her on knee rehab program. We did try joint injection today for her synovitis /effusion which I think is reactive. discussed weight loss--I think elliptical a great machine for her---plantar fasciitis--continue orthotics, plantar exercises. rtc 2 m for f/u.  Complete Medication List: 1)  Hydrochlorothiazide 25 Mg Tabs (Hydrochlorothiazide) .... One tablet by mouth qday 2)  Flonase 50 Mcg/act Susp (Fluticasone propionate) .[name].. 1 spray each nostril daily 3)  Afrin Nasal Spray 0.05 % Soln (Oxymetazoline hcl) .... 3 sprays each nostril twice a day x 3 days maximum 4)  Ibuprofen 800 Mg Tabs (Ibuprofen) .... One tab by mouth q8 as needed pain

## 2010-07-30 NOTE — Progress Notes (Signed)
Summary: lab results   Phone Note Call from Patient Call back at [phone]   Caller: Patient Summary of Call: pt is wanting results of labs Initial call taken by: De Nurse,  Nov 14, 2009 3:44 PM  Follow-up for Phone Call        gave her the results. she is taking the K+ pills & has not had any cramping since. eating bananas. told her glucose was elevated. I will check with md to see if you need to come back in for this & will call her Follow-up by: Golden Circle RN,  Nov 14, 2009 3:56 PM  Additional Follow-up for Phone Call Additional follow up Details #1::        Please ask patient if she was fasting prior to the labs.  If she was have her come back for another office visit to check her for diabetes.  If she ate that morning tell her to just follow up as needed. Additional Follow-up by: Angelena Sole MD,  Nov 15, 2009 1:39 PM    Additional Follow-up for Phone Call Additional follow up Details #2::    it was after breakfast &  lunch had been eaten Follow-up by: Golden Circle RN,  Nov 15, 2009 5:02 PM

## 2010-07-30 NOTE — Assessment & Plan Note (Signed)
Summary: meet new doctor,df   Vital Signs:  Patient profile:   45 year old female Weight:      253 pounds Temp:     98.1 degrees F oral Pulse rate:   80 / minute Pulse rhythm:   regular BP sitting:   137 / 86  (left arm) Cuff size:   large  Vitals Entered By: Loralee Pacas CMA (May 20, 2010 3:36 PM) CC: follow-up visit Is Patient Diabetic? No   Primary Provider:  Lequita Asal  MD  CC:  follow-up visit.  History of Present Illness: 1. HTN well controlled today 137/86 without history of diabetes. on HCTZ. no headaches or blurred vision.    2. Allergic Rhinitis takes flonase for daily symptoms of congestion that worsen overnight. Discussed allergies with patient, at one time she was taking allegra and claritin with relief. I asked her why she stopped and she did not know. She has no specific triggers and it occurs year round  3. symptoms of polyuria and polydipsia with previous abnormal blood sugar, obesity and family history of DM2. will obtain a Ha1c   4. GERD advised patient to continue to take her omeprazole daily. she has suffered a few episodes but has recently been non-compliant.  5. Leg Cramps she experiences leg cramps after sweating a lot from exercise. She was previously perscribed potassium tabs, but they were too large for her.  She is no drinking orange juice and eating bananas around her exercise regimen.  Habits & Providers  Alcohol-Tobacco-Diet     Tobacco Status: never     Tobacco Counseling: not indicated; no tobacco use  Exercise-Depression-Behavior     Does Patient Exercise: yes     Exercise Counseling: to improve exercise regimen     Type of exercise: zumba     Exercise (avg: min/session): 60     Times/week: 3     Have you felt down or hopeless? no     Have you felt little pleasure in things? no     Depression Counseling: not indicated; screening negative for depression     Seat Belt Use: always  Allergies: No Known Drug  Allergies  Past History:  Past Medical History: Last updated: 09/07/2007 allergic sinusitis, perimenopausal symptoms  11/02  let ovarian cyst on CT 8/08  bilateral 1-2 mm lung nodules 9/08 , needs f/u CT in 1 yr only if risk factor for bronchogenic carcinoma  Current Problems:  ABDOMINAL PAIN, LEFT LOWER QUADRANT (ICD-789.04) ACUTE SINUSITIS, UNSPECIFIED (ICD-461.9) DERMATITIS, CONTACT, NOS (ICD-692.9) CYST, OVARIAN NEC/NOS (ICD-620.2) ABDOMINAL PAIN, LEFT LOWER QUADRANT (ICD-789.04) SEXUALLY TRANSMITTED DISEASE, EXPOSURE TO (ICD-V01.6) LEUKORRHEA (ICD-623.5) OM, ACUTE SANGUINOUS (ICD-381.03) RHINITIS, ALLERGIC (ICD-477.9) OBESITY, NOS (ICD-278.00) HYPERTENSION, BENIGN SYSTEMIC (ICD-401.1) GOITER NOS (ICD-240.9) CONSTIPATION (ICD-564.0)     Past Surgical History: Last updated: 09/01/2007 btl with tubal pregnancy -,  FNA- thyroid - 05/17/2003,  Hernia Repair - 06/30/1981,  Hysterectomy & BSO 03/07- menorrhagia/fibriods - 11/07/2005    Family History: Last updated: 09/01/2007 asthma--brother and son, dm--mom and aunt, father died at 40 yo with CVA and COPD, htn--mom, two brothers, sister    Social History: Last updated: 12/07/2009 4 kids, 7 grandkids ('85, '86, '87, '90); 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 asthma--brother and son, dm--mom  and aunt, father died at 77 yo with CVA and COPD, htn--mom, two brothers, sister    Social History: Reviewed history from 12/07/2009 and no changes required. 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 Lungs are clear to auscultation, no crackles or wheezes. Heart:  Normal rate and regular rhythm. S1 and S2 normal without gallop, murmur, click, rub or other extra sounds.   Impression & Recommendations:  Problem # 1:  HYPERTENSION, BENIGN SYSTEMIC (ICD-401.1) well controlled on the current regimen  Her updated medication list for this problem includes:    Hydrochlorothiazide 25 Mg Tabs (Hydrochlorothiazide) ..... One tablet by mouth qday  Orders: [facility]- Est  Level 4 (47425)  Problem # 2:  OTHER ABNORMAL GLUCOSE (ICD-790.29) may have DM2. she has symptoms, obesity and family history suggestive  Orders: A1C-[facility] (95638)  Problem # 3:  KNEE PAIN, LEFT, CHRONIC (ICD-719.46) she is no longer taking her nabumetone and she does not have very frequent knee pain since her knee surgery.  The following medications were removed from the medication list:    Nabumetone 500 Mg Tabs (Nabumetone) .[name]... 1 tab by mouth with food q 12 hrs  Orders: [facility]- Est  Level 4 (75643)  Problem # 4:  OBESITY, NOS (ICD-278.00) Assessment: Improved advised to lose weight with decreased portion sizes and continued exercise.  Orders: A1C-[facility] (32951) [facility]- Est  Level 4 (88416)  Problem # 5:  GERD (ICD-530.81)  Her updated medication list for this problem includes:    Omeprazole 20 Mg Tbec (Omeprazole) .[name]... Take 1 tab by mouth daily for heartburn  Orders: [facility]- Est  Level 4 (60630)  Complete Medication List: 1)  Hydrochlorothiazide 25 Mg Tabs (Hydrochlorothiazide) .... One tablet by mouth qday 2)  Flonase 50 Mcg/act Susp (Fluticasone propionate) .... One spray in each nostril daily. 3)  Omeprazole 20 Mg Tbec (Omeprazole) .... Take 1 tab by mouth daily for heartburn 4)  Fexofenadine Hcl 180 Mg Tabs (Fexofenadine hcl) ....  Take one pill every morning  Patient Instructions: 1)  Please schedule a follow-up appointment in 1 year. Prescriptions: HYDROCHLOROTHIAZIDE 25 MG TABS (HYDROCHLOROTHIAZIDE) one tablet by mouth qday  #30 Tablet x 5   Entered and Authorized by:   Edd Arbour   Signed by:   Edd Arbour on 05/20/2010   Method used:   [facility]ron[facility] to        [facility] [address] [phone]* (retail)       [address]       [place], [place]  [address]       Ph: [phone] or [phone]       Fax: [phone]   RxID:   [id number] FLONASE 50 MCG/ACT SUSP (FLUTICASONE PROPIONATE) one spray in each nostril daily.  #1 x 5   Entered and Authorized by:   Edd Arbour   Signed by:   Edd Arbour on 05/20/2010   Method used:   [facility]        [facility] [address] [phone]* (retail)       [address]       [place]  [place], [place]  [address]       Ph: [phone] or [phone]       Fax: [phone]   RxID:   [id number] OMEPRAZOLE 20 MG TBEC (OMEPRAZOLE) Take 1 tab by mouth daily for heartburn  #30 x 5   Entered and Authorized by:   Edd Arbour   Signed by:   Edd Arbour on 05/20/2010   Method used:   [facility]        [facility] [address] [phone]* (retail)       [address]       [place], [place]  [address]       Ph: [phone] or [phone]       Fax: [phone]   RxID:   [id number] FEXOFENADINE HCL 180 MG TABS (FEXOFENADINE HCL) take one pill every morning  #30 x 5   Entered and Authorized by:   Edd Arbour   Signed by:   Edd Arbour on 05/20/2010   Method used:   [facility]ronically to        [facility] [address] [phone]* (retail)       [address]       [place], [place]  [address]       Ph: [phone] or [phone]       Fax: [phone]   RxID:   [id number]    Orders Added: 1)  A1C-[facility] [83036] 2)  [facility]- Est  Level 4 [16967]  Appended  Document: A1c results  Laboratory Results   Blood Tests   Date/Time Received: May 20, 2010 4:04 PM  Date/Time Reported: May 20, 2010 4:32 PM   HGBA1C: 6.4%   (Normal Range: Non-Diabetic - 3-6%   Control Diabetic - 6-8%)  Comments: ...........test performed by...........Marland KitchenTerese Door, CMA

## 2010-07-30 NOTE — Progress Notes (Signed)
Summary: triage   Phone Note Call from Patient Call back at [phone]   Caller: Patient Summary of Call: fluid in rt ear/achey/congestion Initial call taken by: De Nurse,  August 29, 2009 8:53 AM  Follow-up for Phone Call        started thursday with sore throat & got worse. now predominant problem is r ear. to see pcp at 1:30 today Follow-up by: Golden Circle RN,  August 29, 2009 9:15 AM

## 2010-08-01 NOTE — Assessment & Plan Note (Signed)
Summary: f/u,df   Vital Signs:  Patient profile:   45 year old female Height:      66 inches Weight:      255 pounds BMI:     41.31 Temp:     98.6 degrees F oral Pulse rate:   80 / minute Pulse rhythm:   regular BP sitting:   136 / 85  (right arm) Cuff size:   regular  Vitals Entered By: Loralee Pacas CMA (July 04, 2010 3:13 PM) CC: follow-up visit Is Patient Diabetic? No Pain Assessment Patient in pain? no        Primary Provider:  Lequita Asal  MD  CC:  follow-up visit.  History of Present Illness: 1. pre-diabetes patient has a HgA1c of 6.4. She is obese at 255 pounds. We discussed dietary modification for 25 minutes.  We discussed the option of starting metformin now or in three monthes. She has no blurred vision, no urinary symptoms or polydipsia. She was given a handout about pre-diabetes.  Habits & Providers  Alcohol-Tobacco-Diet     Tobacco Status: never     Tobacco Counseling: not indicated; no tobacco use  Exercise-Depression-Behavior     Have you felt down or hopeless? no     Have you felt little pleasure in things? no     Depression Counseling: not indicated; screening negative for depression     Seat Belt Use: always  Problems Prior to Update: 1)  Other Abnormal Glucose  (ICD-790.29) 2)  Knee Pain, Left, Chronic  (ICD-719.46) 3)  Gerd  (ICD-530.81) 4)  Hypertension, Benign Systemic  (ICD-401.1) 5)  Pes Planus  (ICD-734) 6)  Cyst, Ovarian Nec/nos  (ICD-620.2) 7)  Rhinitis, Allergic  (ICD-477.9) 8)  Obesity, Nos  (ICD-278.00) 9)  Goiter Nos  (ICD-240.9)  Medications Prior to Update: 1)  Hydrochlorothiazide 25 Mg Tabs (Hydrochlorothiazide) .... One Tablet By Mouth Qday 2)  Flonase 50 Mcg/act Susp (Fluticasone Propionate) .... One Spray in Each Nostril Daily. 3)  Omeprazole 20 Mg Tbec (Omeprazole) .... Take 1 Tab By Mouth Daily For Heartburn 4)  Fexofenadine Hcl 180 Mg Tabs (Fexofenadine Hcl) .... Take One Pill Every Morning  Current  Medications (verified): 1)  Hydrochlorothiazide 25 Mg Tabs (Hydrochlorothiazide) .... One Tablet By Mouth Qday 2)  Flonase 50 Mcg/act Susp (Fluticasone Propionate) .... One Spray in Each Nostril Daily. 3)  Omeprazole 20 Mg Tbec (Omeprazole) .... Take 1 Tab By Mouth Daily For Heartburn 4)  Fexofenadine Hcl 180 Mg Tabs (Fexofenadine Hcl) .... Take One Pill Every Morning  Allergies (verified): No Known Drug Allergies  Review of Systems       neg. see hpi   Impression & Recommendations:  Problem # 1:  PREDIABETES (ICD-790.29)  nutritional counselling. 25 minutes spent discussing low-carb diet.  excercise.  may start metformin next visit  Orders: [facility]- Est  Level 4 (04540)  Problem # 2:  OBESITY, NOS (ICD-278.00)  weight loss discussed.  Orders: [facility]- Est  Level 4 (98119)  Complete Medication List: 1)  Hydrochlorothiazide 25 Mg Tabs (Hydrochlorothiazide) .... One tablet by mouth qday 2)  Flonase 50 Mcg/act Susp (Fluticasone propionate) .... One spray in each nostril daily. 3)  Omeprazole 20 Mg Tbec (Omeprazole) .... Take 1 tab by mouth daily for heartburn 4)  Fexofenadine Hcl 180 Mg Tabs (Fexofenadine hcl) .... Take one pill every morning  Patient Instructions: 1)  Please schedule a follow-up appointment in 3 months. 2)  please record your weight once a week. And bring in a  log in three monthes.   Orders Added: 1)  [facility]- Est  Level 4 [04540]

## 2010-08-07 ENCOUNTER — Encounter: Payer: Self-pay | Admitting: *Deleted

## 2010-10-15 ENCOUNTER — Other Ambulatory Visit: Payer: Self-pay | Admitting: Family Medicine

## 2010-10-15 DIAGNOSIS — Z1231 Encounter for screening mammogram for malignant neoplasm of breast: Secondary | ICD-10-CM

## 2010-10-22 ENCOUNTER — Other Ambulatory Visit (HOSPITAL_COMMUNITY)
Admission: RE | Admit: 2010-10-22 | Discharge: 2010-10-22 | Disposition: A | Discharge status: Home / Self Care | Payer: Managed Care, Other (non HMO) | Source: Ambulatory Visit | Attending: Family Medicine | Admitting: Family Medicine

## 2010-10-22 ENCOUNTER — Encounter: Payer: Self-pay | Admitting: Family Medicine

## 2010-10-22 ENCOUNTER — Ambulatory Visit (INDEPENDENT_AMBULATORY_CARE_PROVIDER_SITE_OTHER): Payer: Managed Care, Other (non HMO) | Admitting: Family Medicine

## 2010-10-22 VITALS — BP 137/91 | HR 77 | Temp 98.9°F | Ht 66.0 in | Wt 253.5 lb

## 2010-10-22 DIAGNOSIS — Z01419 Encounter for gynecological examination (general) (routine) without abnormal findings: Secondary | ICD-10-CM | POA: Insufficient documentation

## 2010-10-22 DIAGNOSIS — Z23 Encounter for immunization: Secondary | ICD-10-CM

## 2010-10-22 DIAGNOSIS — Z124 Encounter for screening for malignant neoplasm of cervix: Secondary | ICD-10-CM

## 2010-10-22 MED ORDER — TETANUS-DIPHTH-ACELL PERTUSSIS 5-2.5-18.5 LF-MCG/0.5 IM SUSP: 0.5000 mL | Freq: Once | INTRAMUSCULAR | Status: DC

## 2010-10-22 NOTE — Progress Notes (Signed)
  Subjective:    Patient ID: Cynthia Wilson, female    DOB: 1966-05-28, 45 y.o.   MRN: [id number]  HPI Weight loss for Obesity Patient has lost 3 pounds in 3 monthes. She feels like she lost more weight than the measurement, with clothes fitting better. She is exercising and on a healthy diet as we previously discussed.   Health Maintenance - PAP smear, TDAP Normal appearing cervical exam.  GERD Complains of occasional abdominal pain from reflux. She says the episodes are farther apart since starting the Prilosec. No issues with sleep.   Review of Systems See hpi     Objective:   Physical Exam Lungs CTA B/l RRR ABd: soft, nt, nd Legs, soft, nt, no edema.       Assessment & Plan:  Weight loss for Obesity Will refer to nutritionist Dr. Gerilyn Pilgrim - I think she has hit a plateau and could use more help.  Health Maintenance - PAP smear, TDAP   GERD C/w prilosec. Rec. tums as rescue agent.

## 2010-10-25 ENCOUNTER — Telehealth: Payer: Self-pay | Admitting: Family Medicine

## 2010-10-25 NOTE — Telephone Encounter (Signed)
Pt received a tetanus injection on Tuesday,not is having pain under her armpit.  Please call to advise whether this could have been from injection

## 2010-10-25 NOTE — Telephone Encounter (Signed)
States she woke up with a pain in her axilla. Told her Selinda Michaels that it came from the TD shot. Advised OTC such as tylenol or ibu. Go to UC over the weekend if it gets bad & OTCs do not help. She agreed with the plan

## 2010-10-26 ENCOUNTER — Inpatient Hospital Stay (INDEPENDENT_AMBULATORY_CARE_PROVIDER_SITE_OTHER)
Admission: RE | Admit: 2010-10-26 | Discharge: 2010-10-26 | Disposition: A | Discharge status: Home / Self Care | Payer: Managed Care, Other (non HMO) | Source: Ambulatory Visit | Attending: Family Medicine | Admitting: Family Medicine

## 2010-10-26 DIAGNOSIS — M79609 Pain in unspecified limb: Secondary | ICD-10-CM

## 2010-10-28 ENCOUNTER — Ambulatory Visit (INDEPENDENT_AMBULATORY_CARE_PROVIDER_SITE_OTHER): Payer: Managed Care, Other (non HMO) | Admitting: Family Medicine

## 2010-10-28 ENCOUNTER — Other Ambulatory Visit: Payer: Self-pay | Admitting: *Deleted

## 2010-10-28 ENCOUNTER — Encounter: Payer: Self-pay | Admitting: Family Medicine

## 2010-10-28 VITALS — BP 142/95 | HR 94 | Temp 98.2°F | Wt 249.8 lb

## 2010-10-28 DIAGNOSIS — M79622 Pain in left upper arm: Secondary | ICD-10-CM

## 2010-10-28 DIAGNOSIS — M79609 Pain in unspecified limb: Secondary | ICD-10-CM

## 2010-10-28 DIAGNOSIS — Z23 Encounter for immunization: Secondary | ICD-10-CM

## 2010-10-28 MED ORDER — TETANUS-DIPHTH-ACELL PERTUSSIS 5-2.5-18.5 LF-MCG/0.5 IM SUSP
0.5000 mL | Freq: Once | INTRAMUSCULAR | Status: AC
  Administered 2010-10-28: 0.5 mL via INTRAMUSCULAR

## 2010-10-28 NOTE — Patient Instructions (Signed)
I don't think this is an allergy to the tetanus shot. Continue ibuprofen and ice for soreness Return for recheck if no improvement in 1-2 weeks or signs of infection such as fever, redness, increased swelling or pain.

## 2010-10-28 NOTE — Assessment & Plan Note (Signed)
Conceivable this could be due to TDAP with lymphadenopathy, and ? Mild diarrhea.  It is improving, with no signs of infection, abscess, neurovascular compromise.  Advised to continue ibuprofen and ice prn.   Told pt I did not think this was an allergic reaction and could continue to get tetanus shots every 10 years, reassured her does not need TDAP again.  Advised to return if signs of infection or worsening, or does not resolved in 1-2 weeks

## 2010-10-28 NOTE — Progress Notes (Signed)
  Subjective:    Patient ID: Cynthia Wilson, female    DOB: 02-Mar-1966, 45 y.o.   MRN: [id number]  HPI 6 days ago got tdap shot, 3 days later had swelling and pain under left axilla, same arm as vaccination.  Was seen at urgent care for this, got ibuprofen and vicodin.  Swelling and pain has improved some but continues to have pain.  Also now having some tenderness over deltoid where injection was given.  No fever, abd pain hives, dyapnea.  Had some chills, diarrhead started 2 days ago.  Review of Systemssee hpi     Objective:   Physical Exam  Constitutional: She appears well-developed and well-nourished.  Skin:       Tender to palpation with some slight swelling over left deltoid.  No erythema, edema noted in left axilla but very tender to palpation.  No abscess, LAD noted in axilla nor in cervical region          Assessment & Plan:

## 2010-10-29 ENCOUNTER — Encounter: Payer: Self-pay | Admitting: Family Medicine

## 2010-10-31 ENCOUNTER — Telehealth: Payer: Self-pay | Admitting: Family Medicine

## 2010-10-31 NOTE — Telephone Encounter (Signed)
Patient reports the knot behind left ear has developed , size of a penny  . Denies any discomfort or fever. Consulted Dr. Deirdre Priest.  appointment scheduled tomorrow with PCP.

## 2010-10-31 NOTE — Telephone Encounter (Signed)
Pt seen on 4/30, was fighting off a virus & has noticed a lump on the back of her ear, wants to know if she should wait it out or be seen again?

## 2010-11-01 ENCOUNTER — Encounter: Payer: Self-pay | Admitting: Family Medicine

## 2010-11-01 ENCOUNTER — Ambulatory Visit (INDEPENDENT_AMBULATORY_CARE_PROVIDER_SITE_OTHER): Payer: Managed Care, Other (non HMO) | Admitting: Family Medicine

## 2010-11-01 VITALS — BP 136/88 | HR 96 | Temp 98.4°F | Ht 66.0 in | Wt 248.2 lb

## 2010-11-01 DIAGNOSIS — R599 Enlarged lymph nodes, unspecified: Secondary | ICD-10-CM

## 2010-11-01 DIAGNOSIS — R590 Localized enlarged lymph nodes: Secondary | ICD-10-CM

## 2010-11-01 NOTE — Progress Notes (Signed)
  Subjective:    Patient ID: Cynthia Wilson, female    DOB: [birth year], 45 y.o.   MRN: [id number]  HPI 1. Left Neck Lymph Node Patient states she has a three day history of palpable left neck tender lymph node measureing 0.5/0.5 cm. She has no recent head and neck infection. No fever. No recent viral or bacterial illness. She has no other adenopathy. No weight loss, fatigue, night sweats.   Review of Systems  All other systems reviewed and are negative.       Objective:   Physical Exam  Neck:       Left neck cervical lymph node 0.5x0.5 cm tender.           Assessment & Plan:  1. Cervical Adenopathy - will follow, likely related to head and neck viral/bacterial subacute infection.  She knows to return if it grows or does not resolve within the next two weeks.

## 2010-11-12 NOTE — Letter (Signed)
September 07, 2007    Ms. Cynthia Wilson   RE:  [name], [name]  MRN:  [id number]  /  DOB:  10/06/1965   Dear [name]:   It is my pleasure to have treated you recently as a new patient in my  office.  I appreciate your confidence and the opportunity to participate  in your care.   Since I do have a busy inpatient endoscopy schedule and office schedule,  my office hours vary weekly.  I am, however, available for emergency  calls every day through my office.  If I cannot promptly meet an urgent  office appointment, another one of our gastroenterologists will be able  to assist you.   My well-trained staff are prepared to help you at all times.  For  emergencies after office hours, a physician from our gastroenterology  section is always available through my 24-hour answering service.   While you are under my care, I encourage discussion of your questions  and concerns, and I will be happy to return your calls as soon as I am  available.   Once again, I welcome you as a new patient and I look forward to a happy  and healthy relationship.    Sincerely,      Barbette Hair. Arlyce Dice, MD,FACG  Electronically Signed   RDK/MedQ  DD: 09/07/2007  DT: 09/07/2007  Job #: [id number]

## 2010-11-12 NOTE — Op Note (Signed)
NAMELAKEETA, Cynthia Wilson              ACCOUNT NO.:  [id number]   MEDICAL RECORD NO.:  [id number]          PATIENT TYPE:  AMB   LOCATION:  SDC                           FACILITY:  [facility]   PHYSICIAN:  Charles A. Clearance Coots, M.D.DATE OF BIRTH:  August 28, 1965   DATE OF PROCEDURE:  03/25/2007  DATE OF DISCHARGE:                               OPERATIVE REPORT   PREOP DIAGNOSIS:  1. 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  2. Right salpingectomy.   POSTOP DIAGNOSIS:  Peritoneal inclusion cyst.   PROCEDURE:  1. Operative laparoscopy.  2. Lysis of adhesions.  3. Resection of peritoneal inclusion cyst.   SURGEONS:  Charles A. Clearance Coots, M.D. and Roseanna Rainbow, M.D.   ANESTHESIA:  General.   ESTIMATED BLOOD LOSS:  Negligible.   COMPLICATIONS:  None.   FINDINGS:  Peritoneal inclusion cyst multiple pelvic adhesions.   SPECIMEN:  Peritoneal inclusion cyst wall.   OPERATION:  The patient was brought to operating room and after  satisfactory general endotracheal anesthesia, the legs were brought up  in stirrups, and the abdomen and vagina was prepped and draped in the  usual sterile fashion.  A small supraumbilical incision was made  approximately 3 cm superior to the umbilicus.  The fascia was grasped in  the midline and the fascia was cut transversely with curved Mayo  scissors.  The peritoneum was entered as the fascia was extended to the  left and to the right.  Right-angle retractors were placed in the  incision.   The fascial stay sutures were then placed with the #0 Vicryl in each  corner.  The Hasson cannula was then placed in the incision.  Laparoscopic survey was then done.  The two lower ports were then  placed.  A 12-mm suprapubic port and a 5-mm right lower quadrant port  were placed without complications.  The pelvis was then survey  laparoscopically, and the bowel was noted to be adhesed to the  peritoneum just below the reflection of the urinary bladder,  and this  window in between the bowel and the peritoneum was entered sharply, and  the bowel was dropped down and away from the peritoneum near the  reflection of the urinary bladder.  A dark cystic structure was noted  just the below the area of adhesion of the bowel and this was bluntly  dissected away from the bowel with hydro dissection, and careful blunt-  and-sharp dissection.  The cyst wall was then entered and dark chocolate-  appearing fluid was expelled and was aspirated with the __________  and  the entire cystic structure could then be visualized after aspiration of  the contents, and the cystic structure was then carefully hydrodissected  and bluntly-and-sharply dissected away from the peritoneal cavity, and  was then sharply removed with the dissected scissors, and submitted to  pathology for evaluation.  There was no active bleeding at the  conclusion of the procedure.  Further laparoscopic survey was done, and  the ovary and tube on the left side was not identified because of  multiple adhesions, but no other cystic structures were visualized.  The  procedure was then terminated, and all instruments were removed.  The  laparoscopic ports were closed, routinely, with the fascia being closed  with the supraumbilical port and the suprapubic port with #0 Vicryl, and  the skin being closed on all ports with 4-0 Monocryl.   Adhesive bonding was placed in the incision closures, and the surgical  technician indicated that all needle, sponge, and instrument counts were  correct x2.  The patient tolerated the procedure well.  She was  transported to the recovery room in satisfactory condition.      Charles A. Clearance Coots, M.D.  Electronically Signed     CAH/MEDQ  D:  03/25/2007  T:  03/26/2007  Job:  [id number]

## 2010-11-12 NOTE — Assessment & Plan Note (Signed)
[facility]                         GASTROENTEROLOGY OFFICE NOTE   Cynthia, Wilson                     MRN:          [id number]  DATE:09/07/2007                            DOB:          Apr 05, 1966    REASON FOR CONSULTATION:  Abdominal pain.   Cynthia Wilson is a 45 year old African-American female referred for  evaluation of above.  Since her partial hysterectomy 2 years ago, she  has been suffering from left lower quadrant pain.  She describes the  pain as chronic and hurting.  It is worsened with movement, including  turning, twisting and bending.  She claims to have pain that emanates  from her lower back.  Pain will radiate into her left lower quadrant,  her left groin, and occasionally into her left lower extremity.  It is  not affected by bowel movements.  She has chronic constipation.  There  is no history of rectal bleeding or melena.  She has undergone CT  evaluation that has demonstrated a left adnexal cystic lesion consistent  with ovarian cyst or adnexal mass.   Labs are pertinent for a microcytic anemia.  In September 2008  hemoglobin was 11.9, MCV was 79.4.   PAST MEDICAL HISTORY:  Pertinent for hypertension.  She is status post  herniorrhaphy, tubal ligation, as well as her partial hysterectomy.   FAMILY HISTORY:  Pertinent for mother with diabetes.   MEDICATIONS:  1. Hydrochlorothiazide.  2. Lisinopril.  3. Claritin.  4. Mucinex.   She is allergic to IVP DYE.   She neither smokes nor drinks.  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   REVIEW OF SYSTEMS:  Positive for back pain.   PHYSICAL EXAMINATION:  Pulse 80, blood pressure 112/76, weight 246.  HEENT: EOMI.  PERRLA.  Sclerae are anicteric.  Conjunctivae are pink.  NECK:  Supple without thyromegaly, adenopathy or carotid bruits.  CHEST:  Clear to auscultation and percussion without adventitious  sounds.  CARDIAC:  Regular rhythm; normal S1 S2.  There are  no murmurs, gallops  or rubs.  ABDOMEN:  She has mild tenderness in the left lower quadrant and in the  left groin.  There are no abdominal masses or organomegaly.  EXTREMITIES:  Full range of motion.  No cyanosis, clubbing or edema.  RECTAL:  Deferred.   IMPRESSION:  1. Chronic left lower quadrant pain.  I am suspicious that this pain      could be musculoskeletal.  This is based on the radiation of her      pain into her groin and left lower extremity.  Alternatively, this      could be due to mechanical pain somehow related to her previous      surgery.  Primary gastrointestinal origin is less likely.  It is      noted that she suffers from chronic constipation that could be      contributing, but I do not think this is the main etiology for her      pain.  2. Chronic constipation.  This is likely functional.   RECOMMENDATION:  1. Fiber supplementation.  If it is not sufficient, then I will add      lactulose.  2. Trial of Clinoril 20 mg twice a day for 5 days.  3. Colonoscopy to evaluate any colonic lesions.     Barbette Hair. Arlyce Dice, MD,FACG  Electronically Signed    RDK/MedQ  DD: 09/07/2007  DT: 09/07/2007  Job #: [id number]   cc:   Altamese Cabal, M.D.  Charles A. Clearance Coots, M.D.  Leilani Able, P.A.-C.

## 2010-11-12 NOTE — Letter (Signed)
September 07, 2007    Leilani Able, PA-C  [address], [address]  [place], [place] [address]   RE:  [name], [name]  MRN:  [id number]  /  DOB:  [birth year]   Dear Marcelino Duster:   Upon your kind referral, I had the pleasure of evaluating your patient  and I am pleased to offer my findings.  I saw Cynthia Wilson in the  office today.  Enclosed is a copy of my progress note that details my  findings and recommendations.   Thank you for the opportunity to participate in your patient's care.    Sincerely,      Barbette Hair. Arlyce Dice, MD,FACG  Electronically Signed    RDK/MedQ  DD: 09/07/2007  DT: 09/07/2007  Job #: [id number]

## 2010-11-12 NOTE — Assessment & Plan Note (Signed)
[facility]                         GASTROENTEROLOGY OFFICE NOTE   Cynthia Wilson, Cynthia Wilson                     MRN:          [id number]  DATE:10/11/2007                            DOB:          [birth year]    PROBLEM:  Abdominal pain.   [name] has returned for reevaluation of her abdominal pain.  A  colonoscopy was entirely normal.  I placed her on a regimen of Clinoril,  though she has not taken the medicine.  She continues to complain of  chronic left lower quadrant discomfort.   PHYSICAL EXAMINATION:  VITAL SIGNS:  Pulse 64, blood pressure 108/72,  weight 246.   IMPRESSION:  Persistent left lower quadrant pain.  I believe his pain is  either musculoskeletal pain or perhaps related to her previous  gynecological surgery.  It does not appear to be emanating from her  gastrointestinal tract.   RECOMMENDATIONS:  A trial of Clinoril 200 mg twice a day.  Failing this,  I have instructed her to return to Dr. Clearance Coots for reevaluation.     Barbette Hair. Arlyce Dice, MD,FACG  Electronically Signed    RDK/MedQ  DD: 10/11/2007  DT: 10/11/2007  Job #: [id number]   cc:   Leonette Most A. Clearance Coots, M.D.  Altamese Cabal, M.D.

## 2010-11-15 NOTE — Discharge Summary (Signed)
Cynthia Wilson, Cynthia Wilson              ACCOUNT NO.:  [id number]   MEDICAL RECORD NO.:  [id number]          PATIENT TYPE:  INP   LOCATION:  9313                          [facility]ILITY:  [facility]   PHYSICIAN:  Charles A. Clearance Coots, M.D.DATE OF BIRTH:  08/21/1965   DATE OF ADMISSION:  08/28/2005  DATE OF DISCHARGE:  08/31/2005                                 DISCHARGE SUMMARY   ADMITTING DIAGNOSIS:  Symptomatic uterine fibroids.   DISCHARGE DIAGNOSES:  1.  Symptomatic uterine fibroids.  2.  Pelvic adhesions status post total abdominal hysterectomy, right      salpingectomy and lysis of adhesions.  Discharged home in good      condition.   REASON FOR ADMISSION:  A 45 year old black female with history of increasing  pelvic pain and heavy periods with uterine fibroids.  The patient was  treated with nonsteroidal anti-inflammatory agents without success.  She  desired definitive surgical therapy.  Ultrasound had revealed increased  growth of fibroids between 2004 and 2006 from 4 cm to 6 cm in size.   PAST MEDICAL HISTORY:  SURGERY:  Tubal ligation and hernia repair.  ILLNESS:  None.   MEDICATIONS:  Iron and vitamins.   ALLERGIES:  NO KNOWN DRUG ALLERGIES.   FAMILY HISTORY:  Negative for GYN cancer.   SOCIAL HISTORY:  Negative for tobacco, alcohol or recreational drug use.  The patient is married and living with spouse.   PHYSICAL EXAMINATION:  VITAL SIGNS:  Temperature 36.2 degrees centigrade,  pulse 75, respiratory rate 16 and blood pressure 120/97.  GENERAL:  Well-nourished, well-developed black female in no acute distress.  HEENT EXAM:  Normal.  LUNGS:  Clear to auscultation bilaterally.  HEART:  Regular rate and rhythm.  ABDOMEN:  Soft and nontender.  PELVIC EXAM:  The uterus was 14 weeks' size, irregular contour and tender.   ADMITTING LABORATORY VALUES:  Hemoglobin 11.7, hematocrit 35.8, white blood  cell count 5900 and platelets 421,000.  Coags were within normal limits.  Comprehensive  metabolic panel was within normal limits.  Urinalysis was  within normal limits.   HOSPITAL COURSE:  The patient underwent a total abdominal hysterectomy, a  right salpingectomy and lysis of adhesions on August 28, 2005.  There were no  intraoperative complications.  Postoperative course was uncomplicated.  The  patient was discharged home on postop day #3 in good condition.   DISCHARGE LABORATORY VALUES:  Hemoglobin 9.4, hematocrit 28.8, white blood  cell count 8200 and platelets 363,000.   DISCHARGE DISPOSITION:  MEDICATION:  1.  Tylox and ibuprofen was prescribed for pain.  2.  Continue iron and vitamins.   Routine written instructions were given for discharge after hysterectomy.  The patient is to call the office for a follow up appointment in 2 weeks.      Charles A. Clearance Coots, M.D.  Electronically Signed     CAH/MEDQ  D:  09/18/2005  T:  09/18/2005  Job:  [id number]

## 2010-11-15 NOTE — Op Note (Signed)
Cynthia Wilson, Cynthia Wilson              ACCOUNT NO.:  [id number]   MEDICAL RECORD NO.:  [id number]          PATIENT TYPE:  INP   LOCATION:  9320                          [facility]ACILITY:  [facility]   PHYSICIAN:  Charles A. Clearance Coots, M.D.DATE OF BIRTH:  [birth year]   DATE OF PROCEDURE:  08/28/2005  DATE OF DISCHARGE:                                 OPERATIVE REPORT   PREOPERATIVE DIAGNOSIS:  Symptomatic uterine fibroids.   POSTOPERATIVE DIAGNOSIS:  1.  Symptomatic uterine fibroids.  2.  Multiple pelvic adhesions.   PROCEDURE:  Total abdominal hysterectomy, right salpingectomy, lysis of  adhesions.   SURGEON:  Charles A. Clearance Coots, M.D.   ASSISTANTS:  Roseanna Rainbow, M.D., Kathreen Cosier, M.D.   ANESTHESIA:  General.   ESTIMATED BLOOD LOSS:  500 mL.   SPECIMEN:  Uterus with cervix, right fallopian tube.   COMPLICATIONS:  None.   FINDINGS:  Paraovarian and paratubal adhesions bilaterally.  Posterior cul-  de-sac adhesions.   OPERATION:  The patient was brought to the operating room and after  satisfactory general endotracheal anesthesia, the abdomen was prepped and  draped in the usual sterile fashion.  A Pfannenstiel skin incision was made  through the previous scar with a scalpel down to the fascia with a scalpel.  The fascia was nicked in the midline.  The fascial incision was extended to  the left and to the right with curved Mayo scissors.  The superior and  inferior fascial edges were taken off of the rectus muscles with both blunt  sharp dissection.  The rectus muscle was bluntly divided in the midline.  The peritoneum was entered digitally and was digitally extended to left and  to the right.  The O'Connor-O'Sullivan retractor was then placed in the  incision.  The bowel was packed off, then the anterior and posterior blades  were placed.  The cornu of the uterus was grasped on both sides with long  Kelly forceps, but it was difficult to exteriorize the uterus due to  multiple paratubal and paraovarian adhesions.  The adhesions were  systematically dissected away from the operative field and the uterus was  then exteriorized.  The round ligaments were clamped, and cut and suture  ligated with transfixion sutures of 0 Vicryl bilaterally.  The vesicouterine  fold of peritoneum was then undermined and incised with Metzenbaum scissors,  and the urinary bladder was sharply and bluntly dissected away from the  lower uterine segment and cervix and out of the operative field.  The broad  ligament was then tented digitally on the left side and parametrial clamps  were placed across the fallopian tube, utero-ovarian and broad ligaments,  and the pedicle was cut and suture ligated with a transfixion suture of 0  Vicryl.  The same procedure was performed on the opposite side without  complications.  The uterine vessels were then skeletonized and were doubly  clamped, cut and suture ligated with a transfixion suture of 0 Vicryl, and a  second transfixion suture of 0 Vicryl was placed above the knot.  The  cardinal ligaments were then serially clamped, cut and suture  ligated with  transfixion sutures of 0 Vicryl down to the uterosacral ligaments.  The  uterosacral ligaments with were clamped with curved parametrial clamps, cut  and suture ligated with transfixion sutures of 0 Vicryl.  The vaginal cuff  was then crossclamped from each corner to the center with parametrial clamps  and suture ligated with transfixion sutures of 0 Vicryl in each corner.  The  uterine fundus had previously been transected with the scalpel to allow more  exposure to the cervix for removal of the cervix.  The cervix was then  removed with curved Mayo scissors and submitted to pathology for evaluation  along with the uterus.  The center of the vaginal cuff was closed with  interrupted sutures of 0 Vicryl.  Hemostasis was excellent.  The posterior  cul-de-sac adhesion in between the peritoneum  of the sigmoid colon was  transected and the sigmoid was pushed down and away from the operative field  prior to removal of the cervix.  Hemostasis was excellent.  The pelvic  cavity was thoroughly irrigated with warm saline solution and all clots were  removed.  The pedicles were examined for hemostasis and there was no active  bleeding noted.  The right fallopian tube was bleeding from the base and the  tube was then removed by placing a Kelly forceps across the base of the  right fallopian tube, and the tube was transected with curved Mayo scissors  and submitted to pathology for evaluation.  The left fallopian tube was  deeply adherent to the ovary and the decision was made not to remove the  left fallopian tube.  The pelvic cavity was again observed for hemostasis  and a few areas of peritoneal bleeding were cauterized with the Bovie.  The  abdomen was then closed as follows:  Peritoneum was closed with continuous  suture of 2-0 Vicryl, the fascia was closed with a continuous suture of 0  PDS from each corner to the center, subcutaneous tissue was thoroughly  irrigated with warm saline solution and all areas of subcutaneous bleeding  were coagulated with the Bovie.  Skin was then closed with a continuous  subcuticular suture of 3-0 Monocryl.  A sterile bandage was applied to the  incision closure.  The surgical technician indicated that all needle, sponge  and instrument counts were correct.  The patient tolerated the procedure  well, was transported to the recovery room in satisfactory condition.      Charles A. Clearance Coots, M.D.  Electronically Signed     CAH/MEDQ  D:  08/28/2005  T:  08/28/2005  Job:  [id number]

## 2010-11-18 ENCOUNTER — Ambulatory Visit
Admission: RE | Admit: 2010-11-18 | Discharge: 2010-11-18 | Disposition: A | Discharge status: Home / Self Care | Payer: Managed Care, Other (non HMO) | Source: Ambulatory Visit | Attending: Family Medicine | Admitting: Family Medicine

## 2010-11-18 DIAGNOSIS — Z1231 Encounter for screening mammogram for malignant neoplasm of breast: Secondary | ICD-10-CM

## 2010-11-19 ENCOUNTER — Encounter: Payer: Self-pay | Admitting: Family Medicine

## 2010-11-28 ENCOUNTER — Ambulatory Visit: Payer: Managed Care, Other (non HMO) | Admitting: Family Medicine

## 2010-12-03 ENCOUNTER — Encounter: Payer: Self-pay | Admitting: Family Medicine

## 2010-12-03 ENCOUNTER — Ambulatory Visit (INDEPENDENT_AMBULATORY_CARE_PROVIDER_SITE_OTHER): Payer: Managed Care, Other (non HMO) | Admitting: Family Medicine

## 2010-12-03 DIAGNOSIS — I10 Essential (primary) hypertension: Secondary | ICD-10-CM

## 2010-12-03 NOTE — Progress Notes (Signed)
Medical Nutrition Therapy:  Appt start time: 1130 end time:  1230.  Assessment:  Primary concerns today: Weight management and blood pressure.  Cynthia Wilson has been trying to lose weight since Dec, and has lost inches, but no weight.  She has started exercising, cutting down on her fat intake, and she has substituted water for soda.  Usual eating pattern includes 3 meals and 2 snacks per day.  Everyday foods include meat, water, sugar-free Kool-Aid.  Avoided foods include broccoli.  24-hr recall indicates an intake of about 70 g of fat: B (7:30 AM)- 1 1/2 c Honey Nut Cheerios, 1 c 2% milk; Snk (10:30 AM)- pkg pb crackers, water; L (1 PM)- homemade salad of iceberg, bacon bits, 3 tbsp [employer] [place] salad dressing, water; Snk (4 PM)- king size [employer], water; D ( PM)- 2 McD's chx wraps, 12 oz McD's Chiller.  Usual physical activity includes 45 min Zumba at home 3 X, 30 min eliptical 2 wk, and 45 min walking 1-2 X wk.  Progress Towards Goal(s):  In progress.   Nutritional Diagnosis:  NI-5.6.2 Excessive fat intake As related to condiments, restaurant food, and milk.  As evidenced by daily or near-daily intake of above.  Intervention:  Nutrition education.  Monitoring/Evaluation:  Dietary intake, exercise, and body weight in 4 weeks.

## 2010-12-03 NOTE — Patient Instructions (Addendum)
-   Obtain twice as many veg's as protein or carbohydrate foods for both lunch and dinner. - Getting lots of fruit and vegetables will help you manage your weight and your blood pressure.   - A good goal weight for you is 200 lb.   - Read food labels, and aim for no more than 40-45 g fat per day.  (Most meats = 5 grams of fat per ounce; high-fat meats = 8-10 g per ounce.) - Check out nutritional content of restaurant foods.   - Switch to 1% milk (and if you feel ready, to fat-free at some point).  [2% milk = 5 g fat per cup; whole milk = 8 g per cup; 1% = 2.5 g; fat-free = 0 g.] - More home preparation of foods.   - Advance planning will make the difference of being successful at wt loss or not.  - Call Dr. Gerilyn Pilgrim to schedule an appt for early July:  [phone].  (I am in the office Mon, Tue, and [place].)

## 2010-12-05 ENCOUNTER — Other Ambulatory Visit: Payer: Self-pay | Admitting: Family Medicine

## 2010-12-05 DIAGNOSIS — I10 Essential (primary) hypertension: Secondary | ICD-10-CM

## 2010-12-30 ENCOUNTER — Ambulatory Visit (INDEPENDENT_AMBULATORY_CARE_PROVIDER_SITE_OTHER): Payer: Managed Care, Other (non HMO) | Admitting: Family Medicine

## 2010-12-30 DIAGNOSIS — I10 Essential (primary) hypertension: Secondary | ICD-10-CM

## 2010-12-30 NOTE — Progress Notes (Signed)
Medical Nutrition Therapy:  Appt start time:  0900 end time:  0930.  Assessment:  Primary concerns today: Weight management and blood pressure.  Cynthia Wilson has switched to 1% milk, and Fiber One cereal; walking 2-3 mi 3 X wk and elliptical 2-3 X wk; drinking more water, and eating more fruits and veg's.  She has lost almost 9 lb in a month.  24-hr recall suggests intake of <900 kcal: B (9:30 AM)- 1 c Fiber One w/ 1/2 c 1% milk; Snk (1 PM)- Fiber One bar; L (3 PM)- 1 c cabbage, 1 1/2 c pinto beans, 1 slc meatloaf, 1 c Kool-Aid, 5 Pringle chips; D- none.  Cynthia Wilson was a typical Sunday, but on other days she usually has 3 meals and a couple of snacks a day.  Cynthia Wilson is in a contest with her cousins to see who can lose the most weight by Aug 5, so she is highly motivated to succeed.  Despite high fruit and veg intake, as well as 6-9 c water per day, she still has some constipation.    Progress Towards Goal(s):  Some progress.   Nutritional Diagnosis:  Excellent progress on  NI-5.6.2 Excessive fat intake As related to condiments, restaurant food, and milk.  As evidenced by near-elimination of restaurant foods, and switch to 1% milk.  Intervention:  Nutrition education.  Monitoring/Evaluation:  Per patient.

## 2010-12-30 NOTE — Patient Instructions (Signed)
-   Try a tbsp of flax meal in your cereal or in yogurt or applesauce once daily.  If no difference in bowel function after a week, try 2 tbsp per day.  You should be able to find flax meal (ground flax seeds) at [employer] or [employer] or at [employer], [employer], or [employer].  REFRIGERATE flax meal.   - As you increase fiber intake, increase water also.   - Consider diet drinks instead of Kool-Aid, but continue to make water your main beverage.   - Congratulations on how well you're doing.  Keep up all the good effort at planning ahead, getting lots of veg's and fruits, and all the exercise.   - Email [email or web address] to let me know how the contest turns out, and how many lb you have lost by Aug 5!  GOOD LUCK!

## 2011-02-07 ENCOUNTER — Encounter: Payer: Self-pay | Admitting: Family Medicine

## 2011-02-07 ENCOUNTER — Ambulatory Visit (INDEPENDENT_AMBULATORY_CARE_PROVIDER_SITE_OTHER): Payer: Managed Care, Other (non HMO) | Admitting: Family Medicine

## 2011-02-07 DIAGNOSIS — R42 Dizziness and giddiness: Secondary | ICD-10-CM | POA: Insufficient documentation

## 2011-02-07 HISTORY — DX: Dizziness and giddiness: R42

## 2011-02-07 MED ORDER — MECLIZINE HCL 25 MG PO TABS: 25.0000 mg | ORAL_TABLET | Freq: Three times a day (TID) | ORAL | Status: DC | PRN

## 2011-02-07 NOTE — Progress Notes (Signed)
  Subjective:    Patient ID: Cynthia Wilson, female    DOB: 1966-04-21, 45 y.o.   MRN: [id number]  HPISame day visit:  1 day of dizziness in the setting of 2 days of headache, ear stuffiness, dry cough, sore throat.  Worse when she turns her head.  Lasts for about an hour.  Has been better today than yesterday.  Headache is moderate.  Has not had to take any medications. No syncope or presyncope.  Does not have recent history of dizziness.   Review of Systems    General:  Negative for fever, chills, malaise, myalgias HEENT: Negative for conjunctivitis, ear pain or drainage, rhinorrhea, nasal congestion, Respiratory:  Negative for  sputum, dyspnea Abdomen: Negative for abdominal pain, emesis, diarrhea Skin:  Negative for rash  Neuro: neg for numbness, weakness, tingling    Objective:   Physical Exam  GEN: Alert & Oriented, No acute distress CV:  Regular Rate & Rhythm, no murmur Respiratory:  Normal work of breathing, CTAB Abd:  + BS, soft, no tenderness to palpation Ext: no pre-tibial edema Neuro: CN 2-12 intact.  Exam non-focal.  EOMI, PERRLA.  Dix hallpike elicits dizziness but no nystagmus.      Assessment & Plan:

## 2011-02-07 NOTE — Assessment & Plan Note (Signed)
Vertigo in the setting of recent URI.  No history of physical exam concern for central or infectious etiology.  No cardiac symptoms.  Rx with meclizine, given red flags to return for re-evaluation.

## 2011-02-07 NOTE — Patient Instructions (Signed)
Vertigo    (Dizziness)  Vertigo is a feeling that you are unsteady or dizzy. You may feel that you or things around you are moving. Vertigo causes a spinning feeling. It can make you feel off balance or may give you a whirling feeling. A change in your position can make it worse. Resting can make it better.    HOME CARE   Rest in bed.    Drink clear liquids.    Take medicine to lessen dizziness, nausea (feeling sick to your stomach), and vomiting (throwing up).    Avoid alcohol, tranquilizers, nicotine, caffeine and street drugs.   CAUSES   An infection of the inner ear.    An unusual migraine headache could also be a cause.   Other causes include:   Low blood pressure.    Low blood sugar.      Being upset.     Narrow blood vessels in the brain.    Nerve and heart problems.      Middle ear problems      GET HELP RIGHT AWAY IF:   Your vertigo gets worse.    You have an earache, ear drainage or hearing loss.    You have a bad headache, blurred or double vision, or trouble walking.    You faint or have extreme weakness, chest pain or rapid heart beat (palpitations).    You get a fever or throw up continuously.    You get numb or weak limbs.   Document Released: 03/25/2008 Document Re-Released: 04/12/2009  [employer] Patient Information 2011 [employer].

## 2011-04-10 LAB — BASIC METABOLIC PANEL
CO2: 30
GFR calc Af Amer: >60
GFR calc non Af Amer: >60
Glucose, Bld: 112 — ABNORMAL HIGH
Potassium: 3.9
Sodium: 137

## 2011-04-10 LAB — CBC
Hemoglobin: 11.9 — ABNORMAL LOW
RBC: 4.41
RDW: 14.3 — ABNORMAL HIGH

## 2011-06-17 ENCOUNTER — Ambulatory Visit (INDEPENDENT_AMBULATORY_CARE_PROVIDER_SITE_OTHER): Payer: Managed Care, Other (non HMO) | Admitting: Family Medicine

## 2011-06-17 ENCOUNTER — Encounter: Payer: Self-pay | Admitting: Family Medicine

## 2011-06-17 VITALS — BP 127/88 | HR 79 | Temp 98.4°F | Ht 66.0 in | Wt 236.2 lb

## 2011-06-17 DIAGNOSIS — J069 Acute upper respiratory infection, unspecified: Secondary | ICD-10-CM

## 2011-06-17 NOTE — Progress Notes (Signed)
Subjective: The patient presents today complaining of 5 days of sore throat, fevers, chills, and congestion. Patient also reports some right ear pain and decreased hearing to right ear. She reports no shortness of breath or chest pain. She has not tried any treatment for her condition. She is concerned because, in the past, when she has had a cold, her ear pain has gotten severe enough to be referred to an ENT. At that time, the ENT recommended scheduled Claritin and Mucinex.  Objective:  Filed Vitals:   06/17/11 0921  BP: 127/88  Pulse: 79  Temp: 98.4 F (36.9 C)   Gen: NAD HEENT: RTM has clear fluid behind TM, no injection, LTM clear, clear nasal drainage, no pharyngeal erythema/exudates CV: RRR Resp: CTABL  Assessment/Plan: Viral URI, symptomatic treatment with mucinex, claritin, afrin for up to 2 days, and nasal saline.   Please also see individual problems in problem list for problem-specific plans.

## 2011-10-04 ENCOUNTER — Other Ambulatory Visit: Payer: Self-pay | Admitting: Family Medicine

## 2011-10-27 ENCOUNTER — Other Ambulatory Visit: Payer: Self-pay | Admitting: Family Medicine

## 2011-10-27 DIAGNOSIS — Z1231 Encounter for screening mammogram for malignant neoplasm of breast: Secondary | ICD-10-CM

## 2011-11-19 ENCOUNTER — Ambulatory Visit
Admission: RE | Admit: 2011-11-19 | Discharge: 2011-11-19 | Disposition: A | Discharge status: Home / Self Care | Payer: Managed Care, Other (non HMO) | Source: Ambulatory Visit | Attending: Family Medicine | Admitting: Family Medicine

## 2011-11-19 DIAGNOSIS — Z1231 Encounter for screening mammogram for malignant neoplasm of breast: Secondary | ICD-10-CM

## 2011-12-16 ENCOUNTER — Other Ambulatory Visit: Payer: Self-pay | Admitting: Family Medicine

## 2012-02-24 ENCOUNTER — Encounter: Payer: Self-pay | Admitting: Family Medicine

## 2012-03-09 ENCOUNTER — Ambulatory Visit (INDEPENDENT_AMBULATORY_CARE_PROVIDER_SITE_OTHER): Payer: Managed Care, Other (non HMO) | Admitting: Family Medicine

## 2012-03-09 ENCOUNTER — Encounter: Payer: Self-pay | Admitting: Family Medicine

## 2012-03-09 VITALS — BP 132/85 | HR 87 | Temp 98.4°F | Ht 66.0 in | Wt 247.0 lb

## 2012-03-09 DIAGNOSIS — I10 Essential (primary) hypertension: Secondary | ICD-10-CM

## 2012-03-09 DIAGNOSIS — K219 Gastro-esophageal reflux disease without esophagitis: Secondary | ICD-10-CM

## 2012-03-09 DIAGNOSIS — Z0000 Encounter for general adult medical examination without abnormal findings: Secondary | ICD-10-CM

## 2012-03-09 DIAGNOSIS — E669 Obesity, unspecified: Secondary | ICD-10-CM

## 2012-03-09 DIAGNOSIS — E049 Nontoxic goiter, unspecified: Secondary | ICD-10-CM

## 2012-03-09 DIAGNOSIS — Z1159 Encounter for screening for other viral diseases: Secondary | ICD-10-CM

## 2012-03-09 DIAGNOSIS — R252 Cramp and spasm: Secondary | ICD-10-CM | POA: Insufficient documentation

## 2012-03-09 DIAGNOSIS — R42 Dizziness and giddiness: Secondary | ICD-10-CM

## 2012-03-09 DIAGNOSIS — R7309 Other abnormal glucose: Secondary | ICD-10-CM

## 2012-03-09 LAB — CBC
Hemoglobin: 11.5 g/dL — ABNORMAL LOW (ref 12.0–15.0)
MCH: 25.5 pg — ABNORMAL LOW (ref 26.0–34.0)
MCHC: 32.6 g/dL (ref 30.0–36.0)
Platelets: 359 10*3/uL (ref 150–400)
RDW: 14.2 % (ref 11.5–15.5)

## 2012-03-09 LAB — COMPREHENSIVE METABOLIC PANEL
ALT: 18 U/L (ref 0–35)
Albumin: 4 g/dL (ref 3.5–5.2)
CO2: 27 mEq/L (ref 19–32)
Calcium: 9.2 mg/dL (ref 8.4–10.5)
Chloride: 103 mEq/L (ref 96–112)
Creat: 0.9 mg/dL (ref 0.50–1.10)
Potassium: 4.1 mEq/L (ref 3.5–5.3)

## 2012-03-09 LAB — HEPATITIS C ANTIBODY, REFLEX: HCV Ab: NEGATIVE

## 2012-03-09 LAB — TSH: TSH: 0.523 u[IU]/mL (ref 0.350–4.500)

## 2012-03-09 MED ORDER — POTASSIUM CHLORIDE ER 10 MEQ PO TBCR: 10.0000 meq | EXTENDED_RELEASE_TABLET | Freq: Every day | ORAL | Status: DC | PRN

## 2012-03-09 MED ORDER — OMEPRAZOLE 20 MG PO CPDR: 20.0000 mg | DELAYED_RELEASE_CAPSULE | Freq: Every day | ORAL | Status: DC

## 2012-03-09 MED ORDER — HYDROCHLOROTHIAZIDE 25 MG PO TABS: 25.0000 mg | ORAL_TABLET | Freq: Every day | ORAL | Status: DC

## 2012-03-09 MED ORDER — FLUTICASONE PROPIONATE 50 MCG/ACT NA SUSP: 1.0000 | Freq: Every day | NASAL | Status: DC

## 2012-03-09 NOTE — Progress Notes (Signed)
  Subjective:    Patient ID: Cynthia Wilson, female    DOB: 1966/06/19, 46 y.o.   MRN: [id number]  HPI  Annual Gynecological Exam   Wt Readings from Last 3 Encounters:  03/09/12 247 lb (112.038 kg)  06/17/11 236 lb 3.2 oz (107.14 kg)  02/07/11 237 lb (107.502 kg)   Last period:   hysterecto y  Regular periods: no  Heavy bleeding: no  Sexually active: yes Birth control or hormonal therapy:hysterectomy Hx of STD: Patient does not  STD screening Dyspareunia: No Hot flashes: No Vaginal discharge:  Dysuria:No   Last mammogram: May 2013 Breast mass or concerns: No Last Pap: 2012  History of abnormal pap: No   FH of breast, uterine, ovarian, colon cancer: No  HYPERTENSION  BP Readings from Last 3 Encounters:  03/09/12 132/85  06/17/11 127/88  02/07/11 130/82    Hypertension ROS: taking medications as instructed, no medication side effects noted, no TIA's, no chest pain on exertion, no dyspnea on exertion and noting swelling of ankles.   GERD:  Not takign omeprazole, feeling belching, gassy, sour taste  Patient Information Form: Screening and ROS  AUDIT-C Score: 0 Do you feel safe in relationships? yes PHQ-2:negative  Review of Symptoms  General:  Negative for nexplained weight loss, fever Skin: Negative for new or changing mole, sore that won't heal HEENT: Negative for trouble hearing, trouble seeing, ringing in ears, mouth sores, hoarseness, change in voice, dysphagia. CV:  Negative for chest pain, dyspnea, edema, palpitations Resp: Negative for cough, dyspnea, hemoptysis GI: Negative for nausea, vomiting, diarrhea, constipation, abdominal pain, melena, hematochezia. GU: Negative for dysuria, incontinence, urinary hesitance, hematuria, vaginal or penile discharge, polyuria, sexual difficulty, lumps in testicle or breasts MSK: Negative for muscle cramps or aches, joint pain or swelling Neuro: Negative for headaches, weakness, numbness, dizziness, passing  out/fainting Psych: Negative for depression, anxiety, memory problems   Review of Systems See HPI  Objective:   Physical Exam   GEN: Alert & Oriented, No acute distress CV:  Regular Rate & Rhythm, no murmur Respiratory:  Normal work of breathing, CTAB Abd:  + BS, soft, no tenderness to palpation Ext: no pre-tibial edema Pelvic Exam:        External: normal female genitalia without lesions or masses        Vagina: normal without lesions or masses        Cervix: normal without lesions or masses        Adnexa: normal bimanual exam without masses or fullness        Uterus: not present Breasts: no masses, skin changes, nipple discharge      Assessment & Plan:

## 2012-03-09 NOTE — Patient Instructions (Addendum)
I will send you a latter with your normal labwork, if changes need to be made, I will call  Follow-up in 6-12 months or sooner if needed  Increase whole grains, Lean meats, and green vegetables  Decrease any drinks with sugar  Get your flu vaccine at work!  Nice seeing you!

## 2012-03-09 NOTE — Assessment & Plan Note (Signed)
Was on rare intermittent K supplementation from previous physician.  Taking rarely.  Refilled, discussed with patient if taking more regularly let me know so we can check potassium

## 2012-03-09 NOTE — Assessment & Plan Note (Signed)
Stable, continue current meds 

## 2012-03-09 NOTE — Assessment & Plan Note (Signed)
Chronic and Intermittent.  No red flags, self limited

## 2012-03-09 NOTE — Assessment & Plan Note (Addendum)
Offered flu shot- declined. Screening and Fasting labs obtained

## 2012-03-09 NOTE — Assessment & Plan Note (Signed)
Counseled on healthy lifestyle, patient aware of specialty services available.

## 2012-03-09 NOTE — Assessment & Plan Note (Signed)
Refilled Omeprazole.  Counseled to return if not improving

## 2012-03-11 ENCOUNTER — Encounter: Payer: Self-pay | Admitting: Family Medicine

## 2012-04-02 ENCOUNTER — Encounter: Payer: Self-pay | Admitting: Family Medicine

## 2012-06-17 ENCOUNTER — Ambulatory Visit (INDEPENDENT_AMBULATORY_CARE_PROVIDER_SITE_OTHER): Payer: Managed Care, Other (non HMO) | Admitting: Family Medicine

## 2012-06-17 ENCOUNTER — Encounter: Payer: Self-pay | Admitting: Family Medicine

## 2012-06-17 ENCOUNTER — Ambulatory Visit: Payer: Managed Care, Other (non HMO) | Admitting: Family Medicine

## 2012-06-17 VITALS — BP 129/81 | HR 86 | Temp 98.1°F | Ht 66.0 in | Wt 263.0 lb

## 2012-06-17 DIAGNOSIS — R631 Polydipsia: Secondary | ICD-10-CM

## 2012-06-17 DIAGNOSIS — E119 Type 2 diabetes mellitus without complications: Secondary | ICD-10-CM | POA: Insufficient documentation

## 2012-06-17 LAB — POCT GLYCOSYLATED HEMOGLOBIN (HGB A1C): Hemoglobin A1C: 6.5

## 2012-06-17 MED ORDER — METFORMIN HCL ER 500 MG PO TB24: 500.0000 mg | ORAL_TABLET | Freq: Every day | ORAL | Status: DC

## 2012-06-17 NOTE — Patient Instructions (Addendum)
Try low fat Greek yogurt for breakfast''  Follow-up in 3 months  Diabetes, Frequently Asked Questions WHAT IS DIABETES? Most of the food we eat is turned into glucose (sugar). Our bodies use it for energy. The pancreas makes a hormone called insulin. It helps glucose get into the cells of our bodies. When you have diabetes, your body either does not make enough insulin or cannot use its own insulin as well as it should. This causes sugars to build up in your blood. WHAT ARE THE SYMPTOMS OF DIABETES?  Frequent urination.   Excessive thirst.   Unexplained weight loss.   Extreme hunger.   Blurred vision.   Tingling or numbness in hands or feet.   Feeling very tired much of the time.   Dry, itchy skin.   Sores that are slow to heal.   Yeast infections.  WHAT ARE THE TYPES OF DIABETES? Type 1 Diabetes   About 10% of affected people have this type.   Usually occurs before the age of 10.   Usually occurs in thin to normal weight people.  Type 2 Diabetes  About 90% of affected people have this type.   Usually occurs after the age of 81.   Usually occurs in overweight people.   More likely to have:   A family history of diabetes.   A history of diabetes during pregnancy (gestational diabetes).   High blood pressure.   High cholesterol and triglycerides.  Gestational Diabetes  Occurs in about 4% of pregnancies.   Usually goes away after the baby is born.   More likely to occur in women with:   Family history of diabetes.   Previous gestational diabetes.   Obese.   Over 13 years old.  WHAT IS PRE-DIABETES? Pre-diabetes means your blood glucose is higher than normal, but lower than the diabetes range. It also means you are at risk of getting type 2 diabetes and heart disease. If you are told you have pre-diabetes, have your blood glucose checked again in 1 to 2 years. WHAT IS THE TREATMENT FOR DIABETES? Treatment is aimed at keeping blood glucose near normal  levels at all times. Learning how to manage this yourself is important in treating diabetes. Depending on the type of diabetes you have, your treatment will include one or more of the following:  Monitoring your blood glucose.   Meal planning.   Exercise.   Oral medicine (pills) or insulin.  CAN DIABETES BE PREVENTED? With type 1 diabetes, prevention is more difficult, because the triggers that cause it are not yet known. With type 2 diabetes, prevention is more likely, with lifestyle changes:  Maintain a healthy weight.   Eat healthy.   Exercise.  IS THERE A CURE FOR DIABETES? No, there is no cure for diabetes. There is a lot of research going on that is looking for a cure, and progress is being made. Diabetes can be treated and controlled. People with diabetes can manage their diabetes and lead normal, active lives. SHOULD I BE TESTED FOR DIABETES? If you are at least 48 years old, you should be tested for diabetes. You should be tested again every 3 years. If you are 45 or older and overweight, you may want to get tested more often. If you are younger than 45, overweight, and have one or more of the following risk factors, you should be tested:  Family history of diabetes.   Inactive lifestyle.   High blood pressure.  WHAT ARE SOME OTHER SOURCES  FOR INFORMATION ON DIABETES? The following organizations may help in your search for more information on diabetes: National Diabetes Education Program (NDEP) Internet: SolarDiscussions.es American Diabetes Association Internet: http://www.diabetes.org   Juvenile Diabetes Foundation International Internet: [employer].is Document Released: 06/19/2003 Document Revised: 09/08/2011 Document Reviewed: 04/13/2009 [facility] Patient Information 2013 [place], [place].   Diabetes, Type 2 Diabetes is a long-lasting (chronic) disease. In type 2 diabetes, the pancreas does not make enough insulin (a hormone), and the body does not  respond normally to the insulin that is made. This type of diabetes was also previously called adult-onset diabetes. It usually occurs after the age of 2, but it can occur at any age.   CAUSES   Type 2 diabetes happens because the pancreas is not making enough insulin or your body has trouble using the insulin that your pancreas does make properly. SYMPTOMS    Drinking more than usual.   Urinating more than usual.   Blurred vision.   Dry, itchy skin.   Frequent infections.   Feeling more tired than usual (fatigue).  DIAGNOSIS The diagnosis of type 2 diabetes is usually made by one of the following tests:  Fasting blood glucose test. You will not eat for at least 8 hours and then take a blood test.   Random blood glucose test. Your blood glucose (sugar) is checked at any time of the day regardless of when you ate.   Oral glucose tolerance test (OGTT). Your blood glucose is measured after you have not eaten (fasted) and then after you drink a glucose containing beverage.  TREATMENT    Healthy eating.   Exercise.   Medicine, if needed.   Monitoring blood glucose.   Seeing your caregiver regularly.  HOME CARE INSTRUCTIONS    Check your blood glucose at least once a day. More frequent monitoring may be necessary, depending on your medicines and on how well your diabetes is controlled. Your caregiver will advise you.   Take your medicine as directed by your caregiver.   Do not smoke.   Make wise food choices. Ask your caregiver for information. Weight loss can improve your diabetes.   Learn about low blood glucose (hypoglycemia) and how to treat it.   Get your eyes checked regularly.   Have a yearly physical exam. Have your blood pressure checked and your blood and urine tested.   Wear a pendant or bracelet saying that you have diabetes.   Check your feet every night for cuts, sores, blisters, and redness. Let your caregiver know if you have any problems.  SEEK  MEDICAL CARE IF:    You have problems keeping your blood glucose in target range.   You have problems with your medicines.   You have symptoms of an illness that do not improve after 24 hours.   You have a sore or wound that is not healing.   You notice a change in vision or a new problem with your vision.   You have a fever.  MAKE SURE YOU:  Understand these instructions.   Will watch your condition.   Will get help right away if you are not doing well or get worse.  Document Released: 06/16/2005 Document Revised: 09/08/2011 Document Reviewed: 12/02/2010 [facility] Patient Information 2013 [place], [place].

## 2012-06-17 NOTE — Progress Notes (Signed)
  Subjective:    Patient ID: Cynthia Wilson, female    DOB: Jul 16, 1965, 46 y.o.   MRN: [id number]  HPI Here because she has questions of diabetes  In the past month, Notes after breakfast of honey nut cheerios, gets nauseated and lightheaded right before lunch.  Notes if eats a piece of candy or eats juice, she feels fine.    Notes more thirsty than usual, no polyuria.  Fh + for mom with diabetes. Patient has had impaired glucose in past  Review of Systemssee HPI     Objective:   Physical Exam GEN: NAD       Assessment & Plan:

## 2012-06-17 NOTE — Assessment & Plan Note (Signed)
New dx diabetes,a1c 6.5% start metformin XR 500 mg today. Declines diabetes education at this time.  She helps h erm other with diabets and feels she knows what to do to get started.   Gave her handout from ADA- getting started with diabetes, will follow-up in 3 months  Advised adding protein to breakfast and to consider to help with before lunch time jitters

## 2012-06-20 ENCOUNTER — Telehealth: Payer: Self-pay | Admitting: Family Medicine

## 2012-06-20 NOTE — Telephone Encounter (Signed)
EMERGENCY LINE CALL: Pt reports being Rx'ed metformin this week.  Tolerated it on Friday but has had diarrhea since taking her dose yesterday.  Also with some nausea yesterday and decreased appetite.  Was able to eat this morning without any problems.  Does not feel like she is dehydrated.  Advised pt that these are known side effects and encouraged her to continue taking the metformin for another few days to see if they improved.  Pt reports she will be unable to work with the severity of her diarrhea.  I told the pt it is her choice to continue or stop the medication, but that if she opts to stop, she should f/u with PCP in the next few weeks to discuss alternative treatment.  Patient was in agreement with this plan.  Also encouraged pt to pay attention to fluid status if she does continue the medication to make sure she does not get dehydrated.

## 2012-06-21 ENCOUNTER — Telehealth: Payer: Self-pay | Admitting: Family Medicine

## 2012-06-21 NOTE — Telephone Encounter (Signed)
Returned call to patient.  Started Metformin last Friday and had diarrhea.  On Saturday patient continued to have diarrhea and started having nausea and decreased appetite.  Patient did not take med yesterday or today.  Diarrhea has resolved, but continues to have nausea.  Patient thought symptoms may be due to a stomach virus.  Patient wants to see if nausea resolves and will attempt to restart med.  Will call us back for advice if diarrhea or nausea returns after restarting med.  Gaylene Brooks, RN

## 2012-06-21 NOTE — Telephone Encounter (Signed)
Patient is taking Metformin and it is making her feel so nauseated and she would like to speak to the nurse for suggestions.

## 2012-09-10 ENCOUNTER — Ambulatory Visit: Payer: Managed Care, Other (non HMO) | Admitting: Family Medicine

## 2012-09-13 ENCOUNTER — Ambulatory Visit (INDEPENDENT_AMBULATORY_CARE_PROVIDER_SITE_OTHER): Payer: Managed Care, Other (non HMO) | Admitting: Family Medicine

## 2012-09-13 ENCOUNTER — Encounter: Payer: Self-pay | Admitting: Family Medicine

## 2012-09-13 VITALS — BP 141/74 | HR 82 | Temp 98.3°F | Ht 66.0 in | Wt 266.4 lb

## 2012-09-13 DIAGNOSIS — E119 Type 2 diabetes mellitus without complications: Secondary | ICD-10-CM

## 2012-09-13 DIAGNOSIS — I10 Essential (primary) hypertension: Secondary | ICD-10-CM

## 2012-09-13 LAB — LIPID PANEL
Cholesterol: 150 mg/dL (ref 0–200)
HDL: 39 mg/dL — ABNORMAL LOW (ref >39)
Triglycerides: 78 mg/dL (ref <150)

## 2012-09-13 LAB — BASIC METABOLIC PANEL
BUN: 11 mg/dL (ref 6–23)
Calcium: 8.4 mg/dL (ref 8.4–10.5)
Chloride: 104 mEq/L (ref 96–112)
Creat: 0.82 mg/dL (ref 0.50–1.10)

## 2012-09-13 NOTE — Assessment & Plan Note (Signed)
Well controlled on HCTZ.  She doesnt take Potassium regularly.  Has some left over from another doctor- takes it once a month or so when she has leg cramps.  Checking BMET tdoay

## 2012-09-13 NOTE — Assessment & Plan Note (Signed)
a1c improved.  Doing very well on metformin 500 XR.  Declines diabetes education at this time.  Reminded to keep up with annual eye exam.  Follow-up in 6 months

## 2012-09-13 NOTE — Patient Instructions (Addendum)
Your a1c is 6.2% today.  It was 6.5% last visit in December.    Get yearly eye exam  I will send you a letter with your cholesterol lab results  Follow-up in 6 months

## 2012-09-13 NOTE — Progress Notes (Signed)
  Subjective:    Patient ID: Cynthia Wilson, female    DOB: [birth year], 47 y.o.   MRN: [id number]  HPI HYPERTENSION  BP Readings from Last 3 Encounters:  09/13/12 141/74  06/17/12 129/81  03/09/12 132/85    Hypertension ROS: taking medications as instructed, no medication side effects noted, no chest pain on exertion, no dyspnea on exertion and no swelling of ankles.    DIABETES  Taking and tolerating: yes- Metformin 500 mg XR Fasting blood sugars:no Hypoglycemic symptoms: no Visual problems: no Monitoring feet: yes Numbness/Tingling: no Last eye exam: About 1 year ago Diabetic Labs:  Lab Results  Component Value Date   HGBA1C 6.5 06/17/2012   Lab Results  Component Value Date   CREATININE 0.90 03/09/2012   Last microalbumin: No results found for this basename: MICROALBUR, [id number]       Review of Systems     Objective:   Physical Exam GEN: Alert & Oriented, No acute distress CV:  Regular Rate & Rhythm, no murmur Respiratory:  Normal work of breathing, CTAB Ext: no pre-tibial edema        Assessment & Plan:

## 2012-09-14 ENCOUNTER — Encounter: Payer: Self-pay | Admitting: Family Medicine

## 2012-10-18 ENCOUNTER — Other Ambulatory Visit: Payer: Self-pay

## 2012-10-18 DIAGNOSIS — Z1231 Encounter for screening mammogram for malignant neoplasm of breast: Secondary | ICD-10-CM

## 2012-10-22 ENCOUNTER — Other Ambulatory Visit: Payer: Self-pay | Admitting: Family Medicine

## 2012-11-19 ENCOUNTER — Ambulatory Visit: Payer: Managed Care, Other (non HMO)

## 2012-11-19 ENCOUNTER — Ambulatory Visit
Admission: RE | Admit: 2012-11-19 | Discharge: 2012-11-19 | Disposition: A | Discharge status: Home / Self Care | Payer: Managed Care, Other (non HMO) | Source: Ambulatory Visit

## 2012-11-19 DIAGNOSIS — Z1231 Encounter for screening mammogram for malignant neoplasm of breast: Secondary | ICD-10-CM

## 2012-12-06 ENCOUNTER — Encounter (HOSPITAL_COMMUNITY): Payer: Self-pay | Admitting: *Deleted

## 2012-12-06 ENCOUNTER — Emergency Department (INDEPENDENT_AMBULATORY_CARE_PROVIDER_SITE_OTHER): Payer: Managed Care, Other (non HMO)

## 2012-12-06 ENCOUNTER — Emergency Department (HOSPITAL_COMMUNITY)
Admission: EM | Admit: 2012-12-06 | Discharge: 2012-12-06 | Disposition: A | Discharge status: Home / Self Care | Payer: Managed Care, Other (non HMO) | Source: Home / Self Care | Attending: Emergency Medicine | Admitting: Emergency Medicine

## 2012-12-06 DIAGNOSIS — M19039 Primary osteoarthritis, unspecified wrist: Secondary | ICD-10-CM

## 2012-12-06 MED ORDER — NAPROXEN 500 MG PO TABS: 500.0000 mg | ORAL_TABLET | Freq: Two times a day (BID) | ORAL | Status: DC

## 2012-12-06 MED ORDER — DICLOFENAC EPOLAMINE 1.3 % TD PTCH: MEDICATED_PATCH | TRANSDERMAL | Status: DC

## 2012-12-06 NOTE — ED Provider Notes (Signed)
History     CSN: [id number]  Arrival date & time 12/06/12  1733   First MD Initiated Contact with Patient 12/06/12 1851      Chief Complaint  Patient presents with  . Wrist Pain    (Consider location/radiation/quality/duration/timing/severity/associated sxs/prior treatment) HPI Comments: Pt presents c/o right wrist pain and swelling for 3 days.  It started hurting while she was at work Friday but was only a mild, nagging pain by the time she went to sleep.  The pain then increased to the point where it woke her from her sleep Friday evening.  She denies any known injury, fever, chills, or any history of inflammatory arthritis.    Patient is a 47 y.o. female presenting with wrist pain.  Wrist Pain Pertinent negatives include no chest pain, no abdominal pain and no shortness of breath.    Past Medical History  Diagnosis Date  . Hypertension   . Diabetes mellitus without complication     prediabetes    Past Surgical History  Procedure Laterality Date  . Abdominal hysterectomy  2007    partial, still has her ovaries  . Knee arthroscopy Left 2011    Family History  Problem Relation Age of Onset  . Diabetes Mother   . Hypertension Mother   . Hyperlipidemia Mother   . Emphysema Father   . Cancer Neg Hx   . Stroke Neg Hx   . Heart disease Neg Hx     History  Substance Use Topics  . Smoking status: Never Smoker   . Smokeless tobacco: Not on file  . Alcohol Use: No    OB History   Grav Para Term Preterm Abortions TAB SAB Ect Mult Living                  Review of Systems  Constitutional: Negative for fever and chills.  Eyes: Negative for visual disturbance.  Respiratory: Negative for cough and shortness of breath.   Cardiovascular: Negative for chest pain, palpitations and leg swelling.  Gastrointestinal: Negative for nausea, vomiting and abdominal pain.  Endocrine: Negative for polydipsia and polyuria.  Genitourinary: Negative for dysuria, urgency and  frequency.  Musculoskeletal: Positive for joint swelling and arthralgias (wrist pain - see HPI). Negative for myalgias.  Skin: Negative for rash.  Neurological: Negative for dizziness, weakness and light-headedness.    Allergies  Iohexol  Home Medications   Current Outpatient Rx  Name  Route  Sig  Dispense  Refill  . hydrochlorothiazide (HYDRODIURIL) 25 MG tablet   Oral   Take 1 tablet (25 mg total) by mouth daily.   30 tablet   11   . loratadine (CLARITIN) 10 MG tablet   Oral   Take 10 mg by mouth daily.           . metFORMIN (GLUCOPHAGE-XR) 500 MG 24 hr tablet      TAKE 1 TABLET (500 MG TOTAL) BY MOUTH DAILY WITH BREAKFAST.   30 tablet   5   . Multiple Vitamin (MULTIVITAMIN) capsule   Oral   Take 1 capsule by mouth daily.           . potassium chloride (KLOR-CON 10) 10 MEQ tablet   Oral   Take 1 tablet (10 mEq total) by mouth daily as needed.   30 tablet   1   . diclofenac (FLECTOR) 1.3 % PTCH      Apply patch to wrist BID   30 patch   0   . fluticasone (  FLONASE) 50 MCG/ACT nasal spray   Nasal   Place 1 spray into the nose daily.   16 g   11   . naproxen (NAPROSYN) 500 MG tablet   Oral   Take 1 tablet (500 mg total) by mouth 2 (two) times daily.   60 tablet   0   . omeprazole (PRILOSEC) 20 MG capsule   Oral   Take 1 capsule (20 mg total) by mouth daily. for heartburn   30 capsule   11     BP 143/89  Pulse 75  Temp(Src) 98.6 F (37 C) (Oral)  Resp 16  SpO2 100%  Physical Exam  Nursing note and vitals reviewed. Constitutional: She is oriented to person, place, and time. Vital signs are normal. She appears well-developed and well-nourished. No distress.  HENT:  Head: Atraumatic.  Eyes: EOM are normal. Pupils are equal, round, and reactive to light.  Cardiovascular: Normal rate, regular rhythm and normal heart sounds.  Exam reveals no gallop and no friction rub.   No murmur heard. Pulmonary/Chest: Effort normal and breath sounds  normal. No respiratory distress. She has no wheezes. She has no rales.  Abdominal: Soft. There is no tenderness.  Musculoskeletal:       Right wrist: She exhibits tenderness and swelling (swelling and erythema just proximal and medial to pisiform ).  Neurological: She is alert and oriented to person, place, and time. She has normal strength.  Skin: Skin is warm and dry. She is not diaphoretic.  Psychiatric: She has a normal mood and affect. Her behavior is normal. Judgment normal.    ED Course  Procedures (including critical care time)  Labs Reviewed - No data to display Dg Wrist Complete Right  12/06/2012   *RADIOLOGY REPORT*  Clinical Data: Right wrist pain.  RIGHT WRIST - COMPLETE 3+ VIEW  Comparison: None.  Findings: There are degenerative changes involving the carpal bones, radiocarpal joint and first carpometacarpal joint.  No evidence of bony fracture, dislocation, erosions or osteonecrosis. No soft tissue abnormalities are seen.  IMPRESSION: Degenerative changes of the wrist.  No acute findings.   Original Report Authenticated By: Irish Lack, M.D.     1. Arthritis, wrist       MDM  XR shows some sclerosis of the joint surfaces in the wrists but is negative for any acute abnormality.  Will treat this as arthritis and have her f/u in a week here or with PCP if not improving.  She will f/u sooner if pain continues to worsen.    Note pt was Rx flector patch and naproxen.  I think that given the location of the pain the patch would be more useful and effective but given naproxen Rx as well in case insurance will not cover flector patch - pt instructed not to use both    Meds ordered this encounter  Medications  . diclofenac (FLECTOR) 1.3 % PTCH    Sig: Apply patch to wrist BID    Dispense:  30 patch    Refill:  0  . naproxen (NAPROSYN) 500 MG tablet    Sig: Take 1 tablet (500 mg total) by mouth 2 (two) times daily.    Dispense:  60 tablet    Refill:  0         Graylon Good, PA-C 12/06/12 2117

## 2012-12-06 NOTE — ED Notes (Signed)
C/o pain at work on R medial wrist onset last Friday.  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

## 2012-12-07 NOTE — ED Provider Notes (Signed)
Medical screening examination/treatment/procedure(s) were performed by non-physician practitioner and as supervising physician I was immediately available for consultation/collaboration.  Deidrick Rainey   Jordy Hewins, MD 12/07/12 0836 

## 2013-01-25 ENCOUNTER — Other Ambulatory Visit: Payer: Self-pay | Admitting: Orthopedic Surgery

## 2013-02-01 ENCOUNTER — Ambulatory Visit (HOSPITAL_COMMUNITY)
Admission: RE | Admit: 2013-02-01 | Discharge: 2013-02-01 | Disposition: A | Discharge status: Home / Self Care | Payer: Managed Care, Other (non HMO) | Source: Ambulatory Visit | Attending: Family Medicine | Admitting: Family Medicine

## 2013-02-01 ENCOUNTER — Ambulatory Visit (INDEPENDENT_AMBULATORY_CARE_PROVIDER_SITE_OTHER): Payer: Managed Care, Other (non HMO) | Admitting: Family Medicine

## 2013-02-01 ENCOUNTER — Encounter: Payer: Self-pay | Admitting: Family Medicine

## 2013-02-01 ENCOUNTER — Other Ambulatory Visit: Payer: Self-pay

## 2013-02-01 VITALS — BP 127/86 | HR 99 | Wt 257.0 lb

## 2013-02-01 DIAGNOSIS — Z01810 Encounter for preprocedural cardiovascular examination: Secondary | ICD-10-CM

## 2013-02-01 DIAGNOSIS — R011 Cardiac murmur, unspecified: Secondary | ICD-10-CM

## 2013-02-01 DIAGNOSIS — Z01818 Encounter for other preprocedural examination: Secondary | ICD-10-CM

## 2013-02-01 DIAGNOSIS — E119 Type 2 diabetes mellitus without complications: Secondary | ICD-10-CM | POA: Insufficient documentation

## 2013-02-01 HISTORY — DX: Cardiac murmur, unspecified: R01.1

## 2013-02-01 LAB — POCT GLYCOSYLATED HEMOGLOBIN (HGB A1C): Hemoglobin A1C: 6

## 2013-02-01 LAB — BASIC METABOLIC PANEL
CO2: 28 mEq/L (ref 19–32)
Chloride: 103 mEq/L (ref 96–112)
Creat: 0.91 mg/dL (ref 0.50–1.10)
Glucose, Bld: 108 mg/dL — ABNORMAL HIGH (ref 70–99)

## 2013-02-01 LAB — CBC WITH DIFFERENTIAL/PLATELET
Basophils Relative: 0 % (ref 0–1)
Eosinophils Absolute: 0.1 10*3/uL (ref 0.0–0.7)
HCT: 35.7 % — ABNORMAL LOW (ref 36.0–46.0)
Hemoglobin: 11.7 g/dL — ABNORMAL LOW (ref 12.0–15.0)
MCH: 24.6 pg — ABNORMAL LOW (ref 26.0–34.0)
MCHC: 32.8 g/dL (ref 30.0–36.0)
Monocytes Absolute: 0.5 10*3/uL (ref 0.1–1.0)
Monocytes Relative: 10 % (ref 3–12)
Neutrophils Relative %: 46 % (ref 43–77)

## 2013-02-01 NOTE — Addendum Note (Signed)
Addended by: [place], Kayce Betty on: 02/01/2013 01:59 PM   Modules accepted: Orders

## 2013-02-01 NOTE — Assessment & Plan Note (Signed)
Asymptomatic,condition stable. ECHO ordered for valvular assessment as well as LVEF.

## 2013-02-01 NOTE — Assessment & Plan Note (Signed)
A1C checked today. Continue current dose of metformin pending test result. Diet and exercise recommended.

## 2013-02-01 NOTE — Assessment & Plan Note (Signed)
Assessment done. EKG normal, Echo ordered for hx of murmur. F/U blood test report.

## 2013-02-01 NOTE — Patient Instructions (Signed)
Total Knee Replacement Total knee replacement is a procedure to replace your knee joint with an artificial knee joint (prosthetic knee joint). The purpose of this surgery is to reduce pain and improve your knee function. LET YOUR CAREGIVER KNOW ABOUT:   Any allergies you have.  Any medicines you are taking, including vitamins, herbs, eyedrops, over-the-counter medicines, and creams.  Any problems you have had with the use of anesthetics.  Family history of problems with the use of anesthetics.  Any blood disorders you have, including bleeding problems or clotting problems.  Previous surgeries you have had. RISKS AND COMPLICATIONS  Generally, total knee replacement is a safe procedure. However, as with any surgical procedure, complications can occur. Possible complications associated with total knee replacement include:  Loss of range of motion of the knee or instability.  Loosening of the prosthesis.  Infection.  Persistent pain. BEFORE THE PROCEDURE   Your caregiver will instruct you when you need to stop eating and drinking.  Ask your caregiver if you need to change or stop any regular medicines. PROCEDURE  Just before the procedure you will receive medicine that will make you drowsy (sedative). This will be given through a tube that is inserted into one of your veins (intravenous [IV] tube). Then you will either receive medicine to block pain from the waist down through your legs (spinal block) or medicine to also receive medicine to make you fall asleep (general anesthetic). You may also receive medicine to block feeling in your leg (nerve block) to help ease pain after surgery. An incision will be made in your knee. Your surgeon will take out any damaged cartilage and bone by sawing off the damaged surfaces. Then the surgeon will put a new metal liner over the sawed off portion of your thigh bone (femur) and a plastic liner over the sawed off portion of one of the bones of your  lower leg (tibia). This is to restore alignment and function to your knee. A plastic piece is often used to restore the surface of your knee cap. AFTER THE PROCEDURE  You will be taken to the recovery area. You may have drainage tubes to drain excess fluid from your knee. These tubes attach to a device that removes these fluids. Once you are awake, stable, and taking fluids well, you will be taken to your hospital room. You will receive physical therapy as prescribed by your caregiver. The length of your stay in the hospital after a knee replacement is 2 4 days. Your surgeon may recommend that you spend time (usually an additional 10 14 days) in an extended-care facility to help you begin walking again and improve your range of motion before you go home. You may also be prescribed blood-thinning medicine to decrease your risk of developing blood clots in your leg. Document Released: 09/22/2000 Document Revised: 12/16/2011 Document Reviewed: 07/27/2011 [employer] Patient Information 2014 [employer].  

## 2013-02-01 NOTE — Progress Notes (Signed)
Patient ID: Cynthia Wilson, female   DOB: Jun 28, 1966, 47 y.o.   MRN: [id number]  Subjective:  Pre-Op  [name] is a 47 y.o. female who presents to the office today for a preoperative consultation at the request of surgeon Dr Jodi Geralds who plans on performing Total left Knee replacement on August 15. This consultation is requested for the specific conditions prompting preoperative evaluation (i.e. because of potential affect on operative risk): hx of heart murmur. Planned anesthesia: general. The patient has the following known anesthesia issues: none. Patients bleeding risk: no recent abnormal bleeding. Patient does not have objections to receiving blood products if needed. WU:[id number] with her Metformin,here for follow up. Murmur: Mentioned hx of heart murmurs,denies any chest pain,no prior MI or other heart problem,no palpitations.  The following portions of the patient's history were reviewed and updated as appropriate: allergies, current medications, past family history, past medical history, past social history, past surgical history and problem list.  Review of Systems Pertinent items are noted in HPI.    Objective:    BP 127/86  Pulse 99  Wt 257 lb (116.574 kg)  BMI 41.5 kg/m2 Physical Exam  Nursing note and vitals reviewed. Constitutional: She is oriented to person, place, and time. She appears well-developed. No distress.  HENT:  Head: Normocephalic.  Mouth/Throat: Oropharynx is clear and moist.  Eyes: Pupils are equal, round, and reactive to light.  Neck: Normal range of motion. Neck supple.  Cardiovascular: Normal rate and regular rhythm.   Murmur heard. Mild diastolic murmur.  Pulmonary/Chest: Effort normal. No respiratory distress. She has no wheezes. She has no rales. She exhibits no tenderness.  Abdominal: Soft. Bowel sounds are normal. She exhibits no distension and no mass. There is no tenderness. There is no guarding.  Musculoskeletal: Normal range of  motion.  Neurological: She is alert and oriented to person, place, and time.    Predictors of intubation difficulty:  Morbid obesity? no  Anatomically abnormal facies? no  Prominent incisors? no  Receding mandible? no  Short, thick neck? no  Neck range of motion: normal  Mallampati score: I (soft palate, uvula, fauces, and tonsillar pillars visible)  Thyromental distance: Not measured  Mouth opening: adequate  Dentition: No chipped, loose, or missing teeth.  Cardiographics ECG: normal sinus rhythm, no blocks or conduction defects, no ischemic changes Echocardiogram: ordered.  Imaging Chest x-ray: ordered   Lab Review  not applicable ordered      Assessment:      47 y.o. female with planned surgery as above.   Known risk factors for perioperative complications: Hx of heart murmur ,DM 2,HTN Difficulty with intubation is not anticipated.  Cardiac Risk Estimation: Can take care of self, such as eat, dress, or use the toilet. Can walk up a flight of steps or a hill. Can do heavy work around the house such as scrubbing floors or lifting or moving heavy furniture: Approximately 8-10 METs  Current medications which may produce withdrawal symptoms if withheld perioperatively: N/A      Plan:    1. Preoperative workup as follows chest x-ray, ECG, hemoglobin, hematocrit, electrolytes, creatinine, glucose, coagulation studies. 2. Change in medication regimen before surgery: none, continue medication regimen including morning of surgery, with sip of water. 3. Prophylaxis for cardiac events with perioperative beta-blockers: should be considered, specific regimen per anesthesia. 4. Invasive hemodynamic monitoring perioperatively: not indicated. 5. Deep vein thrombosis prophylaxis postoperatively:regimen to be chosen by surgical team. 6. Surveillance for postoperative MI with ECG immediately  postoperatively and on postoperative days 1 and 2 AND troponin levels 24 hours postoperatively  and on day 4 or hospital discharge (whichever comes first): at the discretion of anesthesiologist.

## 2013-02-02 ENCOUNTER — Encounter (HOSPITAL_COMMUNITY): Payer: Self-pay | Admitting: Pharmacy Technician

## 2013-02-02 ENCOUNTER — Ambulatory Visit (HOSPITAL_COMMUNITY)
Admission: RE | Admit: 2013-02-02 | Discharge: 2013-02-02 | Disposition: A | Discharge status: Home / Self Care | Payer: Managed Care, Other (non HMO) | Source: Ambulatory Visit | Attending: Family Medicine | Admitting: Family Medicine

## 2013-02-02 ENCOUNTER — Encounter (HOSPITAL_COMMUNITY)
Admission: RE | Admit: 2013-02-02 | Discharge: 2013-02-02 | Disposition: A | Discharge status: Home / Self Care | Payer: Managed Care, Other (non HMO) | Source: Ambulatory Visit | Attending: Orthopedic Surgery | Admitting: Orthopedic Surgery

## 2013-02-02 ENCOUNTER — Encounter (HOSPITAL_COMMUNITY): Payer: Self-pay

## 2013-02-02 DIAGNOSIS — I10 Essential (primary) hypertension: Secondary | ICD-10-CM

## 2013-02-02 DIAGNOSIS — R011 Cardiac murmur, unspecified: Secondary | ICD-10-CM

## 2013-02-02 DIAGNOSIS — Z01810 Encounter for preprocedural cardiovascular examination: Secondary | ICD-10-CM | POA: Insufficient documentation

## 2013-02-02 DIAGNOSIS — E119 Type 2 diabetes mellitus without complications: Secondary | ICD-10-CM

## 2013-02-02 DIAGNOSIS — Z01818 Encounter for other preprocedural examination: Secondary | ICD-10-CM | POA: Insufficient documentation

## 2013-02-02 HISTORY — DX: Gastro-esophageal reflux disease without esophagitis: K21.9

## 2013-02-02 HISTORY — DX: Cardiac murmur, unspecified: R01.1

## 2013-02-02 HISTORY — DX: Unspecified osteoarthritis, unspecified site: M19.90

## 2013-02-02 LAB — TYPE AND SCREEN: ABO/RH(D): O POS

## 2013-02-02 NOTE — Progress Notes (Signed)
  Echocardiogram 2D Echocardiogram has been performed.  Cynthia Wilson 02/02/2013, 5:35 PM

## 2013-02-02 NOTE — Progress Notes (Signed)
Cxr,ekg done 02/01/13 in epic some labs done 02/01/13 in epic office called to have dr release orders.

## 2013-02-02 NOTE — Pre-Procedure Instructions (Addendum)
Cynthia Wilson  02/02/2013   Your procedure is scheduled on:  02/11/13  Report to [facility] at 530* AM.  Call this number if you have problems the morning of surgery: [phone]   Remember:   Do not eat food or drink liquids after midnight.   Take these medicines the morning of surgery with A SIP OF WATER: prilosec only             NO DIABETIC MEDS AM OF SURGERY   Do not wear jewelry, make-up or nail polish.  Do not wear lotions, powders, or perfumes. You may wear deodorant.  Do not shave 48 hours prior to surgery. Men may shave face and neck.  Do not bring valuables to the hospital.  [facility] is not responsible                   for any belongings or valuables.  Contacts, dentures or bridgework may not be worn into surgery.  Leave suitcase in the car. After surgery it may be brought to your room.  For patients admitted to the hospital, checkout time is 11:00 AM the day of  discharge.   Patients discharged the day of surgery will not be allowed to drive  home.  Name and phone number of your driver:   Special Instructions: Shower using CHG 2 nights before surgery and the night before surgery.  If you shower the day of surgery use CHG.  Use special wash - you have one bottle of CHG for all showers.  You should use approximately 1/3 of the bottle for each shower.   Please read over the following fact sheets that you were given: Pain Booklet, Coughing and Deep Breathing, Blood Transfusion Information, MRSA Information and Surgical Site Infection Prevention

## 2013-02-02 NOTE — Assessment & Plan Note (Signed)
Assessment done. EKG normal, Echo ordered for hx of murmur. F/U blood test report. 

## 2013-02-03 ENCOUNTER — Encounter: Payer: Self-pay | Admitting: Family Medicine

## 2013-02-07 ENCOUNTER — Encounter: Payer: Self-pay | Admitting: Family Medicine

## 2013-02-10 MED ORDER — CEFAZOLIN SODIUM-DEXTROSE 2-3 GM-% IV SOLR
2.0000 g | INTRAVENOUS | Status: AC
  Administered 2013-02-11: 2 g via INTRAVENOUS
  Filled 2013-02-10: qty 50

## 2013-02-11 ENCOUNTER — Ambulatory Visit (HOSPITAL_COMMUNITY): Payer: Managed Care, Other (non HMO) | Admitting: Anesthesiology

## 2013-02-11 ENCOUNTER — Inpatient Hospital Stay (HOSPITAL_COMMUNITY)
Admission: RE | Admit: 2013-02-11 | Discharge: 2013-02-13 | DRG: 470 | Disposition: A | Discharge status: Home Health | Payer: Managed Care, Other (non HMO) | Source: Ambulatory Visit | Attending: Orthopedic Surgery | Admitting: Orthopedic Surgery

## 2013-02-11 ENCOUNTER — Encounter (HOSPITAL_COMMUNITY): Payer: Self-pay | Admitting: *Deleted

## 2013-02-11 ENCOUNTER — Encounter (HOSPITAL_COMMUNITY)
Admission: RE | Disposition: A | Discharge status: Home Health | Payer: Self-pay | Source: Ambulatory Visit | Attending: Orthopedic Surgery

## 2013-02-11 ENCOUNTER — Encounter (HOSPITAL_COMMUNITY): Payer: Self-pay | Admitting: Anesthesiology

## 2013-02-11 DIAGNOSIS — J309 Allergic rhinitis, unspecified: Secondary | ICD-10-CM | POA: Diagnosis present

## 2013-02-11 DIAGNOSIS — M1710 Unilateral primary osteoarthritis, unspecified knee: Principal | ICD-10-CM | POA: Diagnosis present

## 2013-02-11 DIAGNOSIS — M1712 Unilateral primary osteoarthritis, left knee: Secondary | ICD-10-CM | POA: Diagnosis present

## 2013-02-11 DIAGNOSIS — K219 Gastro-esophageal reflux disease without esophagitis: Secondary | ICD-10-CM | POA: Diagnosis present

## 2013-02-11 DIAGNOSIS — I10 Essential (primary) hypertension: Secondary | ICD-10-CM | POA: Diagnosis present

## 2013-02-11 DIAGNOSIS — E119 Type 2 diabetes mellitus without complications: Secondary | ICD-10-CM | POA: Diagnosis present

## 2013-02-11 DIAGNOSIS — Z6841 Body Mass Index (BMI) 40.0 and over, adult: Secondary | ICD-10-CM

## 2013-02-11 HISTORY — PX: TOTAL KNEE ARTHROPLASTY: SHX125

## 2013-02-11 LAB — URINALYSIS, ROUTINE W REFLEX MICROSCOPIC
Bilirubin Urine: NEGATIVE
Ketones, ur: NEGATIVE mg/dL
Nitrite: NEGATIVE
Urobilinogen, UA: 0.2 mg/dL (ref 0.0–1.0)

## 2013-02-11 LAB — HEPATIC FUNCTION PANEL
ALT: 14 U/L (ref 0–35)
AST: 16 U/L (ref 0–37)
Alkaline Phosphatase: 61 U/L (ref 39–117)
Bilirubin, Direct: <0.1 mg/dL (ref 0.0–0.3)
Total Bilirubin: 0.2 mg/dL — ABNORMAL LOW (ref 0.3–1.2)

## 2013-02-11 LAB — URINE MICROSCOPIC-ADD ON

## 2013-02-11 LAB — GLUCOSE, CAPILLARY
Glucose-Capillary: 122 mg/dL — ABNORMAL HIGH (ref 70–99)
Glucose-Capillary: 125 mg/dL — ABNORMAL HIGH (ref 70–99)
Glucose-Capillary: 116 mg/dL — ABNORMAL HIGH (ref 70–99)

## 2013-02-11 SURGERY — ARTHROPLASTY, KNEE, TOTAL
Anesthesia: Regional | Site: Knee | Laterality: Left | Wound class: Clean

## 2013-02-11 MED ORDER — ONDANSETRON HCL 4 MG/2ML IJ SOLN
INTRAMUSCULAR | Status: DC | PRN
  Administered 2013-02-11: 4 mg via INTRAVENOUS

## 2013-02-11 MED ORDER — HYDROMORPHONE HCL PF 1 MG/ML IJ SOLN
0.2500 mg | INTRAMUSCULAR | Status: DC | PRN
  Administered 2013-02-11 (×3): 0.5 mg via INTRAVENOUS

## 2013-02-11 MED ORDER — CELECOXIB 200 MG PO CAPS
200.0000 mg | ORAL_CAPSULE | Freq: Two times a day (BID) | ORAL | Status: DC
  Administered 2013-02-11 – 2013-02-12 (×3): 200 mg via ORAL
  Filled 2013-02-11 (×5): qty 1

## 2013-02-11 MED ORDER — LIDOCAINE HCL (CARDIAC) 20 MG/ML IV SOLN
INTRAVENOUS | Status: DC | PRN
  Administered 2013-02-11: 65 mg via INTRAVENOUS

## 2013-02-11 MED ORDER — OXYCODONE HCL 5 MG/5ML PO SOLN: 5.0000 mg | Freq: Once | ORAL | Status: DC | PRN

## 2013-02-11 MED ORDER — CEFUROXIME SODIUM 1.5 G IJ SOLR
INTRAMUSCULAR | Status: DC | PRN
  Administered 2013-02-11: 1.5 g

## 2013-02-11 MED ORDER — DOCUSATE SODIUM 100 MG PO CAPS
100.0000 mg | ORAL_CAPSULE | Freq: Two times a day (BID) | ORAL | Status: DC
  Administered 2013-02-11 – 2013-02-13 (×5): 100 mg via ORAL
  Filled 2013-02-11 (×6): qty 1

## 2013-02-11 MED ORDER — FENTANYL CITRATE 0.05 MG/ML IJ SOLN
INTRAMUSCULAR | Status: DC | PRN
  Administered 2013-02-11: 100 ug via INTRAVENOUS
  Administered 2013-02-11 (×2): 50 ug via INTRAVENOUS
  Administered 2013-02-11: 100 ug via INTRAVENOUS
  Administered 2013-02-11: 200 ug via INTRAVENOUS

## 2013-02-11 MED ORDER — ROCURONIUM BROMIDE 100 MG/10ML IV SOLN
INTRAVENOUS | Status: DC | PRN
  Administered 2013-02-11: 50 mg via INTRAVENOUS

## 2013-02-11 MED ORDER — LORATADINE 10 MG PO TABS
10.0000 mg | ORAL_TABLET | Freq: Every day | ORAL | Status: DC
  Administered 2013-02-11 – 2013-02-13 (×3): 10 mg via ORAL
  Filled 2013-02-11 (×3): qty 1

## 2013-02-11 MED ORDER — METHOCARBAMOL 100 MG/ML IJ SOLN
500.0000 mg | Freq: Four times a day (QID) | INTRAVENOUS | Status: DC | PRN
  Filled 2013-02-11: qty 5

## 2013-02-11 MED ORDER — POVIDONE-IODINE 7.5 % EX SOLN: Freq: Once | CUTANEOUS | Status: DC

## 2013-02-11 MED ORDER — NEOSTIGMINE METHYLSULFATE 1 MG/ML IJ SOLN
INTRAMUSCULAR | Status: DC | PRN
  Administered 2013-02-11: 5 mg via INTRAVENOUS

## 2013-02-11 MED ORDER — HYDROMORPHONE HCL PF 1 MG/ML IJ SOLN: 1.0000 mg | INTRAMUSCULAR | Status: DC | PRN

## 2013-02-11 MED ORDER — PANTOPRAZOLE SODIUM 40 MG PO TBEC
40.0000 mg | DELAYED_RELEASE_TABLET | Freq: Every day | ORAL | Status: DC
  Administered 2013-02-13: 40 mg via ORAL
  Filled 2013-02-11: qty 1

## 2013-02-11 MED ORDER — ONDANSETRON HCL 4 MG PO TABS: 4.0000 mg | ORAL_TABLET | Freq: Four times a day (QID) | ORAL | Status: DC | PRN

## 2013-02-11 MED ORDER — ONDANSETRON HCL 4 MG/2ML IJ SOLN: 4.0000 mg | Freq: Four times a day (QID) | INTRAMUSCULAR | Status: DC | PRN

## 2013-02-11 MED ORDER — LACTATED RINGERS IV SOLN
INTRAVENOUS | Status: DC | PRN
  Administered 2013-02-11 (×2): via INTRAVENOUS

## 2013-02-11 MED ORDER — POLYETHYLENE GLYCOL 3350 17 G PO PACK: 17.0000 g | PACK | Freq: Every day | ORAL | Status: DC | PRN

## 2013-02-11 MED ORDER — TRANEXAMIC ACID 100 MG/ML IV SOLN
1000.0000 mg | INTRAVENOUS | Status: AC
  Administered 2013-02-11: 1000 mg via INTRAVENOUS
  Filled 2013-02-11: qty 10

## 2013-02-11 MED ORDER — CEFUROXIME SODIUM 1.5 G IJ SOLR
INTRAMUSCULAR | Status: AC
  Filled 2013-02-11: qty 1.5

## 2013-02-11 MED ORDER — ZOLPIDEM TARTRATE 5 MG PO TABS: 5.0000 mg | ORAL_TABLET | Freq: Every evening | ORAL | Status: DC | PRN

## 2013-02-11 MED ORDER — PROPOFOL 10 MG/ML IV BOLUS
INTRAVENOUS | Status: DC | PRN
  Administered 2013-02-11 (×2): 150 mg via INTRAVENOUS
  Administered 2013-02-11: 40 mg via INTRAVENOUS

## 2013-02-11 MED ORDER — GLYCOPYRROLATE 0.2 MG/ML IJ SOLN
INTRAMUSCULAR | Status: DC | PRN
  Administered 2013-02-11: .8 mg via INTRAVENOUS

## 2013-02-11 MED ORDER — HYDROMORPHONE HCL PF 1 MG/ML IJ SOLN
INTRAMUSCULAR | Status: AC
  Filled 2013-02-11: qty 1

## 2013-02-11 MED ORDER — FLUTICASONE PROPIONATE 50 MCG/ACT NA SUSP
1.0000 | Freq: Every day | NASAL | Status: DC
Start: 2013-02-11 — End: 2013-02-13
  Filled 2013-02-11: qty 16

## 2013-02-11 MED ORDER — OXYCODONE-ACETAMINOPHEN 5-325 MG PO TABS: 1.0000 | ORAL_TABLET | Freq: Four times a day (QID) | ORAL | Status: DC | PRN

## 2013-02-11 MED ORDER — METHOCARBAMOL 100 MG/ML IJ SOLN
500.0000 mg | INTRAVENOUS | Status: AC
  Administered 2013-02-11: 500 mg via INTRAVENOUS
  Filled 2013-02-11: qty 5

## 2013-02-11 MED ORDER — ASPIRIN EC 325 MG PO TBEC: 325.0000 mg | DELAYED_RELEASE_TABLET | Freq: Two times a day (BID) | ORAL | Status: DC

## 2013-02-11 MED ORDER — BUPIVACAINE HCL (PF) 0.25 % IJ SOLN
INTRAMUSCULAR | Status: AC
  Filled 2013-02-11: qty 30

## 2013-02-11 MED ORDER — ARTIFICIAL TEARS OP OINT
TOPICAL_OINTMENT | OPHTHALMIC | Status: DC | PRN
  Administered 2013-02-11: 1 via OPHTHALMIC

## 2013-02-11 MED ORDER — HYDROCHLOROTHIAZIDE 25 MG PO TABS
25.0000 mg | ORAL_TABLET | Freq: Every day | ORAL | Status: DC
  Administered 2013-02-11 – 2013-02-13 (×3): 25 mg via ORAL
  Filled 2013-02-11 (×3): qty 1

## 2013-02-11 MED ORDER — BUPIVACAINE-EPINEPHRINE PF 0.5-1:200000 % IJ SOLN
INTRAMUSCULAR | Status: DC | PRN
  Administered 2013-02-11: 30 mL

## 2013-02-11 MED ORDER — SODIUM CHLORIDE 0.9 % IR SOLN
Status: DC | PRN
  Administered 2013-02-11: 1000 mL
  Administered 2013-02-11: 3000 mL

## 2013-02-11 MED ORDER — FERROUS SULFATE 325 (65 FE) MG PO TABS
325.0000 mg | ORAL_TABLET | Freq: Two times a day (BID) | ORAL | Status: DC
  Administered 2013-02-11: 325 mg via ORAL
  Filled 2013-02-11 (×6): qty 1

## 2013-02-11 MED ORDER — METFORMIN HCL ER 500 MG PO TB24
500.0000 mg | ORAL_TABLET | Freq: Every day | ORAL | Status: DC
  Administered 2013-02-12 – 2013-02-13 (×2): 500 mg via ORAL
  Filled 2013-02-11 (×3): qty 1

## 2013-02-11 MED ORDER — SODIUM CHLORIDE 0.9 % IV SOLN
INTRAVENOUS | Status: DC
  Administered 2013-02-11: 100 mL/h via INTRAVENOUS

## 2013-02-11 MED ORDER — DIPHENHYDRAMINE HCL 12.5 MG/5ML PO ELIX: 12.5000 mg | ORAL_SOLUTION | ORAL | Status: DC | PRN

## 2013-02-11 MED ORDER — METHOCARBAMOL 750 MG PO TABS: ORAL_TABLET | ORAL | Status: DC

## 2013-02-11 MED ORDER — OXYCODONE-ACETAMINOPHEN 5-325 MG PO TABS
1.0000 | ORAL_TABLET | ORAL | Status: DC | PRN
  Administered 2013-02-11 – 2013-02-13 (×6): 2 via ORAL
  Filled 2013-02-11 (×7): qty 2

## 2013-02-11 MED ORDER — PROMETHAZINE HCL 25 MG/ML IJ SOLN: 12.5000 mg | Freq: Four times a day (QID) | INTRAMUSCULAR | Status: DC | PRN

## 2013-02-11 MED ORDER — ASPIRIN EC 325 MG PO TBEC
325.0000 mg | DELAYED_RELEASE_TABLET | Freq: Two times a day (BID) | ORAL | Status: DC
  Administered 2013-02-11 – 2013-02-13 (×4): 325 mg via ORAL
  Filled 2013-02-11 (×6): qty 1

## 2013-02-11 MED ORDER — CEFAZOLIN SODIUM-DEXTROSE 2-3 GM-% IV SOLR
2.0000 g | Freq: Four times a day (QID) | INTRAVENOUS | Status: AC
  Administered 2013-02-11 (×2): 2 g via INTRAVENOUS
  Filled 2013-02-11 (×2): qty 50

## 2013-02-11 MED ORDER — ALUM & MAG HYDROXIDE-SIMETH 200-200-20 MG/5ML PO SUSP: 30.0000 mL | ORAL | Status: DC | PRN

## 2013-02-11 MED ORDER — OXYCODONE HCL 5 MG PO TABS: 5.0000 mg | ORAL_TABLET | Freq: Once | ORAL | Status: DC | PRN

## 2013-02-11 MED ORDER — INSULIN ASPART 100 UNIT/ML ~~LOC~~ SOLN
0.0000 [IU] | Freq: Three times a day (TID) | SUBCUTANEOUS | Status: DC
  Administered 2013-02-12 – 2013-02-13 (×2): 2 [IU] via SUBCUTANEOUS

## 2013-02-11 MED ORDER — MIDAZOLAM HCL 5 MG/5ML IJ SOLN
INTRAMUSCULAR | Status: DC | PRN
  Administered 2013-02-11: 2 mg via INTRAVENOUS

## 2013-02-11 MED ORDER — METHOCARBAMOL 500 MG PO TABS: 500.0000 mg | ORAL_TABLET | Freq: Four times a day (QID) | ORAL | Status: DC | PRN

## 2013-02-11 MED ORDER — METOCLOPRAMIDE HCL 5 MG/ML IJ SOLN
INTRAMUSCULAR | Status: DC | PRN
  Administered 2013-02-11: 10 mg via INTRAVENOUS

## 2013-02-11 SURGICAL SUPPLY — 65 items
APL SKNCLS STERI-STRIP NONHPOA (GAUZE/BANDAGES/DRESSINGS) ×1
BANDAGE ESMARK 6X9 LF (GAUZE/BANDAGES/DRESSINGS) ×1 IMPLANT
BENZOIN TINCTURE PRP APPL 2/3 (GAUZE/BANDAGES/DRESSINGS) ×2 IMPLANT
BLADE SAGITTAL 25.0X1.19X90 (BLADE) ×2 IMPLANT
BLADE SAW SAG 90X13X1.27 (BLADE) ×2 IMPLANT
BNDG CMPR 9X6 STRL LF SNTH (GAUZE/BANDAGES/DRESSINGS) ×1
BNDG ESMARK 6X9 LF (GAUZE/BANDAGES/DRESSINGS) ×2
BOWL SMART MIX CTS (DISPOSABLE) ×2 IMPLANT
CAPT RP KNEE ×1 IMPLANT
CEMENT HV SMART SET (Cement) ×4 IMPLANT
CLOTH BEACON ORANGE TIMEOUT ST (SAFETY) ×2 IMPLANT
CLSR STERI-STRIP ANTIMIC 1/2X4 (GAUZE/BANDAGES/DRESSINGS) ×2 IMPLANT
COVER SURGICAL LIGHT HANDLE (MISCELLANEOUS) ×2 IMPLANT
CUFF TOURNIQUET SINGLE 34IN LL (TOURNIQUET CUFF) ×2 IMPLANT
CUFF TOURNIQUET SINGLE 44IN (TOURNIQUET CUFF) IMPLANT
DRAPE EXTREMITY T 121X128X90 (DRAPE) ×2 IMPLANT
DRAPE U-SHAPE 47X51 STRL (DRAPES) ×2 IMPLANT
DRSG PAD ABDOMINAL 8X10 ST (GAUZE/BANDAGES/DRESSINGS) ×2 IMPLANT
DURAPREP 26ML APPLICATOR (WOUND CARE) ×2 IMPLANT
ELECT REM PT RETURN 9FT ADLT (ELECTROSURGICAL) ×2
ELECTRODE REM PT RTRN 9FT ADLT (ELECTROSURGICAL) ×1 IMPLANT
EVACUATOR 1/8 PVC DRAIN (DRAIN) ×2 IMPLANT
FACESHIELD LNG OPTICON STERILE (SAFETY) ×2 IMPLANT
GAUZE XEROFORM 5X9 LF (GAUZE/BANDAGES/DRESSINGS) ×2 IMPLANT
GLOVE BIOGEL PI IND STRL 8 (GLOVE) ×2 IMPLANT
GLOVE BIOGEL PI INDICATOR 8 (GLOVE) ×2
GLOVE ECLIPSE 7.5 STRL STRAW (GLOVE) ×4 IMPLANT
GOWN PREVENTION PLUS LG XLONG (DISPOSABLE) IMPLANT
GOWN STRL NON-REIN LRG LVL3 (GOWN DISPOSABLE) ×2 IMPLANT
GOWN STRL REIN XL XLG (GOWN DISPOSABLE) ×4 IMPLANT
HANDPIECE INTERPULSE COAX TIP (DISPOSABLE) ×2
HOOD PEEL AWAY FACE SHEILD DIS (HOOD) ×6 IMPLANT
IMMOBILIZER KNEE 20 (SOFTGOODS)
IMMOBILIZER KNEE 20 THIGH 36 (SOFTGOODS) IMPLANT
IMMOBILIZER KNEE 22 UNIV (SOFTGOODS) ×2 IMPLANT
IMMOBILIZER KNEE 24 THIGH 36 (MISCELLANEOUS) IMPLANT
IMMOBILIZER KNEE 24 UNIV (MISCELLANEOUS)
KIT BASIN OR (CUSTOM PROCEDURE TRAY) ×2 IMPLANT
KIT ROOM TURNOVER OR (KITS) ×2 IMPLANT
MANIFOLD NEPTUNE II (INSTRUMENTS) ×2 IMPLANT
NDL HYPO 25GX1X1/2 BEV (NEEDLE) IMPLANT
NEEDLE HYPO 25GX1X1/2 BEV (NEEDLE) ×2 IMPLANT
NS IRRIG 1000ML POUR BTL (IV SOLUTION) ×2 IMPLANT
PACK TOTAL JOINT (CUSTOM PROCEDURE TRAY) ×2 IMPLANT
PAD ARMBOARD 7.5X6 YLW CONV (MISCELLANEOUS) ×4 IMPLANT
PAD CAST 4YDX4 CTTN HI CHSV (CAST SUPPLIES) ×1 IMPLANT
PADDING CAST ABS 4INX4YD NS (CAST SUPPLIES) ×1
PADDING CAST ABS COTTON 4X4 ST (CAST SUPPLIES) IMPLANT
PADDING CAST COTTON 4X4 STRL (CAST SUPPLIES) ×2
SET HNDPC FAN SPRY TIP SCT (DISPOSABLE) ×1 IMPLANT
SPONGE GAUZE 4X4 12PLY (GAUZE/BANDAGES/DRESSINGS) ×2 IMPLANT
STAPLER VISISTAT 35W (STAPLE) IMPLANT
STRIP CLOSURE SKIN 1/2X4 (GAUZE/BANDAGES/DRESSINGS) ×2 IMPLANT
SUCTION FRAZIER TIP 10 FR DISP (SUCTIONS) ×2 IMPLANT
SUT MON AB 3-0 SH 27 (SUTURE) ×2
SUT MON AB 3-0 SH27 (SUTURE) IMPLANT
SUT VIC AB 0 CTB1 27 (SUTURE) ×4 IMPLANT
SUT VIC AB 1 CT1 27 (SUTURE) ×4
SUT VIC AB 1 CT1 27XBRD ANBCTR (SUTURE) ×2 IMPLANT
SUT VIC AB 2-0 CTB1 (SUTURE) ×4 IMPLANT
SYR CONTROL 10ML LL (SYRINGE) IMPLANT
TOWEL OR 17X24 6PK STRL BLUE (TOWEL DISPOSABLE) ×2 IMPLANT
TOWEL OR 17X26 10 PK STRL BLUE (TOWEL DISPOSABLE) ×2 IMPLANT
TRAY FOLEY CATH 16FRSI W/METER (SET/KITS/TRAYS/PACK) ×2 IMPLANT
WATER STERILE IRR 1000ML POUR (IV SOLUTION) ×3 IMPLANT

## 2013-02-11 NOTE — Transfer of Care (Signed)
Immediate Anesthesia Transfer of Care Note  Patient: Cynthia Wilson  Procedure(s) Performed: Procedure(s) with comments: LEFT TOTAL KNEE ARTHROPLASTY (Left) - NO COMPUTER  Patient Location: PACU  Anesthesia Type:General  Level of Consciousness: awake and patient cooperative  Airway & Oxygen Therapy: Patient Spontanous Breathing and Patient connected to nasal cannula oxygen  Post-op Assessment: Report given to PACU RN, Post -op Vital signs reviewed and stable and Patient moving all extremities X 4  Post vital signs: Reviewed and stable  Complications: No apparent anesthesia complications

## 2013-02-11 NOTE — H&P (Signed)
TOTAL KNEE ADMISSION H&P  Patient is being admitted for left total knee arthroplasty.  Subjective:  Chief Complaint:left knee pain.  HPI: Cynthia Wilson, 47 y.o. female, has a history of pain and functional disability in the left knee due to arthritis and has failed non-surgical conservative treatments for greater than 12 weeks to includeNSAID's and/or analgesics, corticosteriod injections, viscosupplementation injections, flexibility and strengthening excercises, use of assistive devices and activity modification.  Onset of symptoms was gradual, starting 4 years ago with gradually worsening course since that time. The patient noted no past surgery on the left knee(s).  Patient currently rates pain in the left knee(s) at 8 out of 10 with activity. Patient has night pain, worsening of pain with activity and weight bearing, pain that interferes with activities of daily living, pain with passive range of motion and joint swelling.  Patient has evidence of subchondral cysts, subchondral sclerosis, periarticular osteophytes and joint space narrowing by imaging studies. This patient has had failure of all conservative care. There is no active infection.  Patient Active Problem List   Diagnosis Date Noted  . Pre-operative examination 02/02/2013  . Heart murmur 02/01/2013  . Diabetes mellitus 02/01/2013  . Polydipsia 06/17/2012  . Diabetes 06/17/2012  . Annual physical exam 03/09/2012  . Muscle cramps 03/09/2012  . Vertigo 02/07/2011  . KNEE PAIN, LEFT, CHRONIC 07/30/2009  . GERD 01/17/2009  . GOITER NOS 08/27/2006  . OBESITY, NOS 08/27/2006  . HYPERTENSION, BENIGN SYSTEMIC 08/27/2006  . RHINITIS, ALLERGIC 08/27/2006   Past Medical History  Diagnosis Date  . Hypertension   . Diabetes mellitus without complication     prediabetes  . Heart murmur   . GERD (gastroesophageal reflux disease)   . Arthritis     Past Surgical History  Procedure Laterality Date  . Abdominal hysterectomy  2007     partial, still has her ovaries  . Knee arthroscopy Left 2011  . Hernia repair  81    umbilical    No prescriptions prior to admission   Allergies  Allergen Reactions  . Iohexol      Code: HIVES, Desc: Pt called on 9/2/8 and stated she had a 2 day delayed reaction from contrast.  She broke out in hives and a rash all over her body.  She went to urgent care and received a steroid pack. Check w/ rad for premedication before any IV contrast., Onset Date: [id number]   . Other Rash    Wrap used when gave blood    History  Substance Use Topics  . Smoking status: Never Smoker   . Smokeless tobacco: Not on file  . Alcohol Use: No    Family History  Problem Relation Age of Onset  . Diabetes Mother   . Hypertension Mother   . Hyperlipidemia Mother   . Emphysema Father   . Cancer Neg Hx   . Stroke Neg Hx   . Heart disease Neg Hx      ROS ROS: I have reviewed the patient's review of systems thoroughly and there are no positive responses as relates to the HPI. Objective:  Physical Exam  Vital signs in last 24 hours:    Labs:   Estimated body mass index is 43.02 kg/(m^2) as calculated from the following:   Height as of 09/13/12: 5\' 6"  (1.676 m).   Weight as of 09/13/12: 120.838 kg (266 lb 6.4 oz).   Imaging Review Plain radiographs demonstrate severe degenerative joint disease of the left knee(s). The overall alignment ismild  varus. The bone quality appears to be good for age and reported activity level.  Assessment/Plan:  End stage arthritis, left knee   The patient history, physical examination, clinical judgment of the provider and imaging studies are consistent with end stage degenerative joint disease of the left knee(s) and total knee arthroplasty is deemed medically necessary. The treatment options including medical management, injection therapy arthroscopy and arthroplasty were discussed at length. The risks and benefits of total knee arthroplasty were presented and  reviewed. The risks due to aseptic loosening, infection, stiffness, patella tracking problems, thromboembolic complications and other imponderables were discussed. The patient acknowledged the explanation, agreed to proceed with the plan and consent was signed. Patient is being admitted for inpatient treatment for surgery, pain control, PT, OT, prophylactic antibiotics, VTE prophylaxis, progressive ambulation and ADL's and discharge planning. The patient is planning to be discharged home with home health services

## 2013-02-11 NOTE — Preoperative (Signed)
Beta Blockers   Reason not to administer Beta Blockers:Not Applicable 

## 2013-02-11 NOTE — Anesthesia Preprocedure Evaluation (Addendum)
Anesthesia Evaluation  Patient identified by MRN, date of birth, ID band Patient awake    Reviewed: Allergy & Precautions, H&P , NPO status , Patient's Chart, lab work & pertinent test results  Airway Mallampati: II TM Distance: >3 FB Neck ROM: Full    Dental no notable dental hx. (+) Teeth Intact and Dental Advisory Given   Pulmonary neg pulmonary ROS,  breath sounds clear to auscultation  Pulmonary exam normal       Cardiovascular hypertension, On Medications Rhythm:Regular Rate:Normal     Neuro/Psych negative neurological ROS  negative psych ROS   GI/Hepatic Neg liver ROS, GERD-  Medicated and Controlled,  Endo/Other  diabetes, Type 2, Oral Hypoglycemic AgentsMorbid obesity  Renal/GU negative Renal ROS  negative genitourinary   Musculoskeletal   Abdominal   Peds  Hematology negative hematology ROS (+)   Anesthesia Other Findings   Reproductive/Obstetrics negative OB ROS                          Anesthesia Physical Anesthesia Plan  ASA: III  Anesthesia Plan: General and Regional   Post-op Pain Management:    Induction: Intravenous  Airway Management Planned: LMA  Additional Equipment:   Intra-op Plan:   Post-operative Plan: Extubation in OR  Informed Consent: I have reviewed the patients History and Physical, chart, labs and discussed the procedure including the risks, benefits and alternatives for the proposed anesthesia with the patient or authorized representative who has indicated his/her understanding and acceptance.   Dental advisory given  Plan Discussed with: CRNA  Anesthesia Plan Comments:         Anesthesia Quick Evaluation

## 2013-02-11 NOTE — Brief Op Note (Signed)
02/11/2013  1:38 PM  PATIENT:  Cynthia Wilson  47 y.o. female  PRE-OPERATIVE DIAGNOSIS:  DEGENERATIVE JOINT DISEASE  POST-OPERATIVE DIAGNOSIS:  DEGENERATIVE JOINT DISEASE  PROCEDURE:  Procedure(s) with comments: LEFT TOTAL KNEE ARTHROPLASTY (Left) - NO COMPUTER  SURGEON:  Surgeon(s) and Role:    * Harvie Junior, MD - Primary  PHYSICIAN ASSISTANT:   ASSISTANTS: bethune   ANESTHESIA:   general  EBL:  Total I/O In: 1750 [I.V.:1750] Out: 450 [Urine:300; Drains:100; Blood:50]  BLOOD ADMINISTERED:none  DRAINS: (1) Hemovact drain(s) in the l knee with  Suction Open   LOCAL MEDICATIONS USED:  NONE  SPECIMEN:  No Specimen  DISPOSITION OF SPECIMEN:  N/A  COUNTS:  YES  TOURNIQUET:   Total Tourniquet Time Documented: Thigh (Left) - 79 minutes Total: Thigh (Left) - 79 minutes   DICTATION: .Other Dictation: Dictation Number [id number]  PLAN OF CARE: Admit to inpatient   PATIENT DISPOSITION:  PACU - hemodynamically stable.   Delay start of Pharmacological VTE agent (>24hrs) due to surgical blood loss or risk of bleeding: no

## 2013-02-11 NOTE — Anesthesia Procedure Notes (Signed)
Anesthesia Regional Block:  Femoral nerve block  Pre-Anesthetic Checklist: ,, timeout performed, Correct Patient, Correct Site, Correct Laterality, Correct Procedure, Correct Position, site marked, Risks and benefits discussed, pre-op evaluation,  At surgeon's request and post-op pain management  Laterality: Left  Prep: Maximum Sterile Barrier Precautions used and chloraprep       Needles:  Injection technique: Single-shot  Needle Type: Echogenic Stimulator Needle     Needle Length: 5cm 5 cm Needle Gauge: 22 and 22 G    Additional Needles:  Procedures: ultrasound guided (picture in chart) Femoral nerve block  Nerve Stimulator or Paresthesia:  Response: Patellar respose,   Additional Responses:   Narrative:  Start time: 02/11/2013 7:10 AM End time: 02/11/2013 7:18 AM Injection made incrementally with aspirations every 5 mL. Anesthesiologist: Harjot Zavadil,MD  Additional Notes: 2% Lidocaine skin wheel.   Femoral nerve block

## 2013-02-11 NOTE — Anesthesia Postprocedure Evaluation (Signed)
  Anesthesia Post-op Note  Patient: Cynthia Wilson  Procedure(s) Performed: Procedure(s) with comments: LEFT TOTAL KNEE ARTHROPLASTY (Left) - NO COMPUTER  Patient Location: PACU  Anesthesia Type:GA combined with regional for post-op pain  Level of Consciousness: awake  Airway and Oxygen Therapy: Patient Spontanous Breathing and Patient connected to nasal cannula oxygen  Post-op Pain: mild  Post-op Assessment: Post-op Vital signs reviewed, Patient's Cardiovascular Status Stable, Respiratory Function Stable, Patent Airway and No signs of Nausea or vomiting  Post-op Vital Signs: Reviewed and stable  Complications: No apparent anesthesia complications

## 2013-02-11 NOTE — Op Note (Signed)
NAMEKEIDRA, WITHERS NO.:  [id number]  MEDICAL RECORD NO.:  [id number]  LOCATION:  [id number]                        FACILITY:  [facility]  PHYSICIAN:  Harvie Junior, M.D.   DATE OF BIRTH:  01/26/1966  DATE OF PROCEDURE:  02/11/2013 DATE OF DISCHARGE:                              OPERATIVE REPORT   PREOPERATIVE DIAGNOSIS:  End-stage degenerative joint disease, left knee.  POSTOPERATIVE DIAGNOSIS:  End-stage degenerative joint disease, left knee.  PROCEDURE:  Left total knee replacement with a Sigma system, size 3 femur, size 3 tibia, a 10 mm bridging bearing, and a 35 mm all polyethylene patella.  SURGEON:  Harvie Junior, M.D.  ASSISTANT:  Marshia Ly, PA.  ANESTHESIA:  General.  BRIEF HISTORY:  Ms. Penado is a 47 year old female with a long history of having had significant complaints of left knee pain.  We treated conservatively for a period of time, but after failure of all conservative care, the patient was taken to the operating room for left total knee replacement.  The patient had significant night pain preoperatively.  She had light activity pain.  She had failed injection therapy, activity modification, and use of a cane.  She comes in for this procedure and she is taken to the operating room for the same.  PROCEDURE:  The patient was taken to the operating room.  After adequate anesthesia was achieved with general anesthetic, the patient was placed supine on the operating table.  The left leg was prepped and draped in usual sterile fashion.  Following this, the leg was exsanguinated. Blood pressure tourniquet inflated to 350 mmHg.  Following this, a midline incision was made in the subcutaneous tissue down the level of the extensor mechanism and medial parapatellar arthrotomy was undertaken.  Once this was completed, attention was turned towards the left knee where medial and lateral meniscus were removed, retropatellar fat pad, and synovium in  the anterior aspect of the femur and anterior and posterior cruciates.  Following this, the tibia was cut perpendicular to its long axis with an extramedullary guide.  An intramedullary guide was used for the femur with a 5-degree valgus alignment cut.  The distal femur was resected.  Spacer block was put in place.  I got an 10 in full extension.  Attention was then turned to sizing the femur, it was sized to a 4, it was cut to a 4.  Attention was turned to the tibia, it was drilled and keeled.  We went for trial reduction.  At that point, I really could not get a trial reduction because the flexion gap was just not big enough to allow for the space as the extension gap was fine.  At this point, we downsized the femoral component to a 3 and picked up 4 mm in the back, this actually gave [place] perfect gap balance and at this point, the trial components were put in place, size 3 femur, size 3 tibia, 10 mm bridging bearing, and a 35 all poly patella after the lugs had then drilled for this and lugs drilled for the femur.  Once this was done, we put through a range of motion, was stable, it had good  range of motion and once that was done, the trial components were removed, the knee was copiously and thoroughly lavaged with normal saline irrigation, suctioned dry.  The final components were then cemented into place, size 3 femur, size 3 tibia, 10 mm bridging bearing trial was placed and 35 all poly patella held with a clamp.  All excess bone cement was removed.  The bone cement was allowed to completely harden and once this was the case tourniquet was let down. All bleeding was controlled with electrocautery.  We checked a 12.5 poly just to check and obviously it was too tight.  At that point, we put the 10 poly back in and opened the final 10 poly and put it down and then the knee was put through a range of motion, stability was checked and this was excellent.  At this point, the wound was again  irrigated, suctioned dry.  A medium Hemovac drain was placed.  The medial parapatellar arthrotomy was closed with 1 Vicryl running, skin with 0 and 2-0 Vicryl, and 3-0 Monocryl subcuticular.  Benzoin and Steri-Strips were applied.  Sterile compressive dressing was applied, and the patient was taken to the recovery room and was noted to be in a satisfactory condition.  Estimated blood loss for the procedure was minimal.     Harvie Junior, M.D.     Ranae Plumber  D:  02/11/2013  T:  02/11/2013  Job:  [id number]

## 2013-02-11 NOTE — Progress Notes (Signed)
Orthopedic Tech Progress Note Patient Details:  Cynthia Wilson [birth year] [id number]  CPM Left Shoulder CPM Left Shoulder: On Left Shoulder Flexion (Degrees): 60 Left Shoulder Extension (Degrees): 0 Additional Comments: Trapeze bar     Shawnie Pons 02/11/2013, 12:49 PM

## 2013-02-11 NOTE — Evaluation (Signed)
Physical Therapy Evaluation Patient Details Name: Cynthia Wilson MRN: [id number] DOB: June 08, 1966 Today's Date: 02/11/2013 Time: 4098-1191 PT Time Calculation (min): 23 min  PT Assessment / Plan / Recommendation History of Present Illness  Patient is a 47 yo female s/p Lt. TKA.  Clinical Impression  Patient presents with problems listed below.  Will benefit from acute PT to maximize independence prior to discharge home with husband.    PT Assessment  Patient needs continued PT services    Follow Up Recommendations  Home health PT;Supervision/Assistance - 24 hour    Does the patient have the potential to tolerate intense rehabilitation      Barriers to Discharge        Equipment Recommendations  3in1 (PT)    Recommendations for Other Services     Frequency 7X/week    Precautions / Restrictions Precautions Precautions: Knee Precaution Booklet Issued: Yes (comment) Precaution Comments: Reviewed precautions with patient and husband. Required Braces or Orthoses: Knee Immobilizer - Left Knee Immobilizer - Left: On except when in CPM;Discontinue once straight leg raise with < 10 degree lag Restrictions Weight Bearing Restrictions: Yes LLE Weight Bearing: Weight bearing as tolerated   Pertinent Vitals/Pain       Mobility  Bed Mobility Bed Mobility: Supine to Sit;Sitting - Scoot to Edge of Bed Supine to Sit: 4: Min assist;HOB elevated Sitting - Scoot to Edge of Bed: 4: Min guard Details for Bed Mobility Assistance: Instructed patient on donning KI to LLE.  Verbal cues for technique.  Assist to move LLE off of bed. Transfers Transfers: Sit to Stand;Stand to [employer] Transfers Sit to Stand: 4: Min guard;With upper extremity assist;From bed Stand to Sit: 4: Min guard;With upper extremity assist;With armrests;To chair/3-in-1 Stand Pivot Transfers: 4: Min guard Details for Transfer Assistance: Verbal cues for hand placement.  Patient required only min guard assist for  safety.  Patient able to take several steps to pivot to chair. Ambulation/Gait Ambulation/Gait Assistance: Not tested (comment)    Exercises Total Joint Exercises Ankle Circles/Pumps: AROM;Both;10 reps;Seated   PT Diagnosis: Difficulty walking;Acute pain  PT Problem List: Decreased strength;Decreased range of motion;Decreased activity tolerance;Decreased balance;Decreased mobility;Decreased knowledge of use of DME;Decreased knowledge of precautions;Pain PT Treatment Interventions: DME instruction;Gait training;Stair training;Functional mobility training;Therapeutic exercise;Patient/family education     PT Goals(Current goals can be found in the care plan section) Acute Rehab PT Goals Patient Stated Goal: To be able to go home soon PT Goal Formulation: With patient/family Time For Goal Achievement: 02/18/13 Potential to Achieve Goals: Good  Visit Information  Last PT Received On: 02/11/13 Assistance Needed: +1 History of Present Illness: Patient is a 47 yo female s/p Lt. TKA.       Prior Functioning  Home Living Family/patient expects to be discharged to:: Private residence Living Arrangements: Spouse/significant other;Children Available Help at Discharge: Family;Available 24 hours/day Type of Home: House Home Access: Stairs to enter [employer] of Steps: 5 Entrance Stairs-Rails: Right;Left Home Layout: One level Home Equipment: Environmental consultant - 2 wheels Prior Function Level of Independence: Independent with assistive device(s) Communication Communication: No difficulties    Cognition  Cognition Arousal/Alertness: Awake/alert Behavior During Therapy: WFL for tasks assessed/performed Overall Cognitive Status: Within Functional Limits for tasks assessed    Extremity/Trunk Assessment Upper Extremity Assessment Upper Extremity Assessment: Overall WFL for tasks assessed Lower Extremity Assessment Lower Extremity Assessment: LLE deficits/detail LLE Deficits / Details:  Strength and ROM decreased due to surgery/pain LLE: Unable to fully assess due to pain;Unable to fully assess due  to immobilization Cervical / Trunk Assessment Cervical / Trunk Assessment: Normal   Balance Balance Balance Assessed: Yes Static Sitting Balance Static Sitting - Balance Support: No upper extremity supported;Feet supported Static Sitting - Level of Assistance: 5: Stand by assistance Static Sitting - Comment/# of Minutes: 4  End of Session PT - End of Session Equipment Utilized During Treatment: Gait belt;Left knee immobilizer Activity Tolerance: Patient tolerated treatment well Patient left: in chair;with call bell/phone within reach;with family/visitor present Nurse Communication: Mobility status CPM Left Knee CPM Left Knee: Off (off at 15:55)  GP     Vena Austria 02/11/2013, 4:34 PM Durenda Hurt. Renaldo Fiddler, [facility] Acute Rehab Services Pager [phone]

## 2013-02-12 LAB — GLUCOSE, CAPILLARY
Glucose-Capillary: 147 mg/dL — ABNORMAL HIGH (ref 70–99)
Glucose-Capillary: 117 mg/dL — ABNORMAL HIGH (ref 70–99)

## 2013-02-12 LAB — CBC
MCH: 25.3 pg — ABNORMAL LOW (ref 26.0–34.0)
MCV: 78 fL (ref 78.0–100.0)
Platelets: 290 10*3/uL (ref 150–400)
RDW: 15.1 % (ref 11.5–15.5)
WBC: 8.8 10*3/uL (ref 4.0–10.5)

## 2013-02-12 LAB — BASIC METABOLIC PANEL
Calcium: 8.1 mg/dL — ABNORMAL LOW (ref 8.4–10.5)
Chloride: 100 mEq/L (ref 96–112)
Creatinine, Ser: 0.93 mg/dL (ref 0.50–1.10)
GFR calc Af Amer: 82 mL/min — ABNORMAL LOW (ref >90)

## 2013-02-12 NOTE — Progress Notes (Signed)
Subjective: POD 1 L TKA Tol PO, ambulating presently with PT.  Feels well.   Objective: Vital signs in last 24 hours: Temp:  [97.7 F (36.5 C)-98.7 F (37.1 C)] 97.7 F (36.5 C) (08/16 0607) Pulse Rate:  [70-109] 70 (08/16 0607) Resp:  [11-18] 18 (08/16 0607) BP: (100-148)/(59-80) 100/59 mmHg (08/16 0607) SpO2:  [96 %-100 %] 100 % (08/16 0607)  Intake/Output from previous day: 08/15 0701 - 08/16 0700 In: 2950 [I.V.:2950] Out: 2755 [Urine:2025; Drains:680; Blood:50] Intake/Output this shift: Total I/O In: 360 [P.O.:360] Out: -    Recent Labs  02/12/13 0600  HGB 10.0*    Recent Labs  02/12/13 0600  WBC 8.8  RBC 3.96  HCT 30.9*  PLT 290    Recent Labs  02/12/13 0600  NA 137  K 3.3*  CL 100  CO2 30  BUN 11  CREATININE 0.93  GLUCOSE 137*  CALCIUM 8.1*   No results found for this basename: LABPT, INR,  in the last 72 hours  KI, dressing intact, + F/E toes/ankle  Assessment/Plan: D/C drain HL IVFs Cont. PO protocol, poss D/C tomorrow if clears PT and tol PO pain meds   Cynthia Wilson A. 02/12/2013, 9:53 AM

## 2013-02-12 NOTE — Progress Notes (Signed)
Physical Therapy Treatment Patient Details Name: Cynthia Wilson MRN: [id number] DOB: 06/23/1966 Today's Date: 02/12/2013 Time: 4098-1191 PT Time Calculation (min): 38 min  PT Assessment / Plan / Recommendation  History of Present Illness Patient is a 47 yo female s/p Lt. TKA.   PT Comments   Pt cont's to make steady progress with mobility at this time.  Pt needs to practice steps before d/cing home.    Follow Up Recommendations  Home health PT;Supervision/Assistance - 24 hour     Does the patient have the potential to tolerate intense rehabilitation     Barriers to Discharge        Equipment Recommendations  3in1 (PT)    Recommendations for Other Services    Frequency 7X/week   Progress towards PT Goals Progress towards PT goals: Progressing toward goals  Plan Current plan remains appropriate    Precautions / Restrictions Precautions Precautions: Knee Precaution Comments: Reviewed precautions with patient and husband. Required Braces or Orthoses: Knee Immobilizer - Left Knee Immobilizer - Left: On except when in CPM;Discontinue once straight leg raise with < 10 degree lag Restrictions LLE Weight Bearing: Weight bearing as tolerated   Pertinent Vitals/Pain Mild pain but did not rate.     Mobility  Bed Mobility Bed Mobility: Supine to Sit;Sitting - Scoot to Edge of Bed;Sit to Supine Supine to Sit: 4: Min assist;HOB flat Sitting - Scoot to Edge of Bed: 4: Min assist Sit to Supine: 4: Min assist;HOB flat Details for Bed Mobility Assistance: (A) for LE management Transfers Transfers: Sit to Stand;Stand to Sit Sit to Stand: 4: Min guard;With upper extremity assist;With armrests;From chair/3-in-1;From bed Stand to Sit: 4: Min guard;With upper extremity assist;With armrests;To bed;To chair/3-in-1 Details for Transfer Assistance: guarding for safety Ambulation/Gait Ambulation/Gait Assistance: 4: Min guard Ambulation Distance (Feet): 120 Feet Assistive device: Rolling  walker Ambulation/Gait Assistance Details: cues to keep RW closer to body, & encouragement to increase LLE WBing Gait Pattern: Step-to pattern;Decreased stance time - left;Decreased weight shift to left Stairs: No Wheelchair Mobility Wheelchair Mobility: No    Exercises Total Joint Exercises Ankle Circles/Pumps: AROM;Both;10 reps Quad Sets: AROM;Strengthening;Both;10 reps Hip ABduction/ADduction: AAROM;Strengthening;Left;10 reps Straight Leg Raises: AAROM;Strengthening;Left;10 reps Knee Flexion: AAROM;Left;10 reps     PT Goals (current goals can now be found in the care plan section) Acute Rehab PT Goals Patient Stated Goal: To be able to go home soon PT Goal Formulation: With patient/family Time For Goal Achievement: 02/18/13 Potential to Achieve Goals: Good  Visit Information  Last PT Received On: 02/12/13 Assistance Needed: +1 History of Present Illness: Patient is a 47 yo female s/p Lt. TKA.    Subjective Data  Patient Stated Goal: To be able to go home soon   Cognition  Cognition Arousal/Alertness: Awake/alert Behavior During Therapy: WFL for tasks assessed/performed Overall Cognitive Status: Within Functional Limits for tasks assessed    Balance     End of Session PT - End of Session Equipment Utilized During Treatment: Gait belt;Left knee immobilizer Activity Tolerance: Patient tolerated treatment well Patient left: in bed;in CPM;with call bell/phone within reach;with family/visitor present Nurse Communication: Mobility status   GP     Lara Mulch 02/12/2013, 3:28 PM   Verdell Face, PTA [phone] 02/12/2013

## 2013-02-12 NOTE — Progress Notes (Signed)
Physical Therapy Treatment Patient Details Name: Cynthia Wilson MRN: [id number] DOB: [birth year] Today's Date: 02/12/2013 Time: 5621-3086 PT Time Calculation (min): 25 min  PT Assessment / Plan / Recommendation  History of Present Illness Patient is a 47 yo female s/p Lt. TKA.   PT Comments   Pt progressing well with PT goals & mobility at this date.  Able to ambulate ~100' with RW.  Very motivated.  Cont with current POC to ensure safe d/c home when MD feels medically ready.    Follow Up Recommendations  Home health PT;Supervision/Assistance - 24 hour     Does the patient have the potential to tolerate intense rehabilitation     Barriers to Discharge        Equipment Recommendations  3in1 (PT)    Recommendations for Other Services    Frequency 7X/week   Progress towards PT Goals Progress towards PT goals: Progressing toward goals  Plan Current plan remains appropriate    Precautions / Restrictions Precautions Precautions: Knee Precaution Comments: Reviewed precautions with patient and husband. Required Braces or Orthoses: Knee Immobilizer - Left Knee Immobilizer - Left: On except when in CPM;Discontinue once straight leg raise with < 10 degree lag Restrictions Weight Bearing Restrictions: Yes LLE Weight Bearing: Weight bearing as tolerated   Pertinent Vitals/Pain Reports pain as mild but did not rate.      Mobility  Bed Mobility Bed Mobility: Supine to Sit;Sitting - Scoot to Edge of Bed Supine to Sit: 4: Min assist;HOB flat Sitting - Scoot to [employer] of Bed: 4: Min assist Details for Bed Mobility Assistance: (A) for LLE management Transfers Transfers: Sit to Stand;Stand to Sit Sit to Stand: 4: Min guard;With upper extremity assist;From bed;From chair/3-in-1;With armrests Stand to Sit: 4: Min guard;With upper extremity assist;With armrests;To chair/3-in-1 Details for Transfer Assistance: cues for hand placement & LLE positioning before sitting.    Ambulation/Gait Ambulation/Gait Assistance: 4: Min guard Ambulation Distance (Feet): 100 Feet Assistive device: Rolling walker Ambulation/Gait Assistance Details: cues for sequencing & safe RW advancement.   Gait Pattern: Step-to pattern;Decreased step length - right;Decreased stance time - left;Decreased weight shift to left;Wide base of support Stairs: No Wheelchair Mobility Wheelchair Mobility: No      PT Goals (current goals can now be found in the care plan section) Acute Rehab PT Goals Patient Stated Goal: To be able to go home soon PT Goal Formulation: With patient/family Time For Goal Achievement: 02/18/13 Potential to Achieve Goals: Good  Visit Information  Last PT Received On: 02/12/13 Assistance Needed: +1 History of Present Illness: Patient is a 47 yo female s/p Lt. TKA.    Subjective Data  Patient Stated Goal: To be able to go home soon   Cognition  Cognition Arousal/Alertness: Awake/alert Behavior During Therapy: WFL for tasks assessed/performed Overall Cognitive Status: Within Functional Limits for tasks assessed    Balance     End of Session PT - End of Session Equipment Utilized During Treatment: Gait belt;Left knee immobilizer Activity Tolerance: Patient tolerated treatment well Patient left: in chair;with call bell/phone within reach;Other (comment) (MD present) Nurse Communication: Mobility status   GP     Lara Mulch 02/12/2013, 10:02 AM   Verdell Face, PTA [phone] 02/12/2013

## 2013-02-12 NOTE — Progress Notes (Signed)
Orthopedic Tech Progress Note Patient Details:  Cynthia Wilson 09/11/1965 [id number]  Patient ID: [name], female   DOB: May 20, 1966, 47 y.o.   MRN: [id number] Put pt's lle in cpm @ 0- 70 degrees @ 2050  Nikki Dom 02/12/2013, 8:56 PM

## 2013-02-13 LAB — CBC
HCT: 29.6 % — ABNORMAL LOW (ref 36.0–46.0)
MCH: 24.9 pg — ABNORMAL LOW (ref 26.0–34.0)
MCHC: 31.8 g/dL (ref 30.0–36.0)
MCV: 78.5 fL (ref 78.0–100.0)
Platelets: 256 10*3/uL (ref 150–400)
RDW: 15.2 % (ref 11.5–15.5)

## 2013-02-13 NOTE — Progress Notes (Signed)
Physical Therapy Treatment Patient Details Name: Cynthia Wilson MRN: [id number] DOB: Sep 12, 1965 Today's Date: 02/13/2013 Time: 3244-0102 PT Time Calculation (min): 25 min  PT Assessment / Plan / Recommendation  History of Present Illness Patient is a 47 yo female s/p Lt. TKA.   PT Comments   Pt progressing well with therapy. Has met all goals for safe discharge from mobility standpoint. Pt is at supervision level for mobility and will have family for 24/7 (A). Discussed car transfer technique to ensure safe D/C; pt able to verbalize understanding. Pt independent with HEP.   Follow Up Recommendations  Home health PT;Supervision/Assistance - 24 hour     Does the patient have the potential to tolerate intense rehabilitation     Barriers to Discharge        Equipment Recommendations  3in1 (PT)    Recommendations for Other Services    Frequency 7X/week   Progress towards PT Goals Progress towards PT goals: Progressing toward goals  Plan Current plan remains appropriate    Precautions / Restrictions Precautions Precautions: Knee Precaution Comments: no pillow under knee; pt verbalized understanding; also still demos a lag with SLR so discussed KI wear schedule  Required Braces or Orthoses: Knee Immobilizer - Left Knee Immobilizer - Left: On except when in CPM;Discontinue once straight leg raise with < 10 degree lag Restrictions Weight Bearing Restrictions: Yes LLE Weight Bearing: Weight bearing as tolerated   Pertinent Vitals/Pain 5/10; "goes up some with exercises"     Mobility  Bed Mobility Bed Mobility: Supine to Sit Supine to Sit: 6: Modified independent (Device/Increase time) Details for Bed Mobility Assistance: no physical (A) needed; HOB flat and rails discontinued to simulate home enviroment  Transfers Transfers: Sit to Stand;Stand to Sit Sit to Stand: 5: Supervision;From bed Stand to Sit: 5: Supervision;To chair/3-in-1;With armrests Details for Transfer  Assistance: supervision for min cues for hand placement and safety  Ambulation/Gait Ambulation/Gait Assistance: 5: Supervision Ambulation Distance (Feet): 220 Feet Assistive device: Rolling walker Ambulation/Gait Assistance Details: cues for gt sequencing to increase step length on Rt LE and increase heel strike and toe off on Lt LE; towards end of session pt was progressing more steadily to step through gt  Gait Pattern: Step-to pattern;Decreased stance time - left;Decreased weight shift to left;Step-through pattern Gait velocity: steadily progressing  Stairs: Yes Stairs Assistance: 5: Supervision Stairs Assistance Details (indicate cue type and reason): supervision for safety and cues for gt sequencing  Stair Management Technique: Two rails;Step to pattern;Forwards Number of Stairs: 5 Wheelchair Mobility Wheelchair Mobility: No    Exercises Total Joint Exercises Ankle Circles/Pumps: AROM;Both;10 reps Quad Sets: AROM;Strengthening;10 reps;Left Heel Slides: AROM;Left;10 reps;Seated;Other (comment) (using pillow case to (A) ) Hip ABduction/ADduction: AAROM;Strengthening;Left;10 reps Long Arc Quad: AROM;Left;10 reps Goniometric ROM: knee flexion to 70 degrees in sitting limited by pain   PT Diagnosis:    PT Problem List:   PT Treatment Interventions:     PT Goals (current goals can now be found in the care plan section) Acute Rehab PT Goals Patient Stated Goal: home today with son and husband  PT Goal Formulation: With patient/family Time For Goal Achievement: 02/18/13 Potential to Achieve Goals: Good  Visit Information  Last PT Received On: 02/13/13 Assistance Needed: +1 History of Present Illness: Patient is a 48 yo female s/p Lt. TKA.    Subjective Data  Subjective: Pt lying supine; agreeable to therapy. states "i know i got to do them steps today'  Patient Stated Goal: home today with son  and husband    Cognition  Cognition Arousal/Alertness: Awake/alert Behavior  During Therapy: WFL for tasks assessed/performed Overall Cognitive Status: Within Functional Limits for tasks assessed    Balance  Balance Balance Assessed: No  End of Session PT - End of Session Equipment Utilized During Treatment: Gait belt;Left knee immobilizer Activity Tolerance: Patient tolerated treatment well Patient left: with call bell/phone within reach;in chair;with nursing/sitter in room Nurse Communication: Mobility status   GP     Donell Sievert, [place] [phone] 02/13/2013, 10:32 AM

## 2013-02-13 NOTE — Progress Notes (Signed)
Patient stated that she has a 3in1 at home. Patient discharged.

## 2013-02-13 NOTE — Discharge Summary (Signed)
Physician Discharge Summary  Patient ID: Cynthia Wilson MRN: [id number] DOB/AGE: 1966/05/04 47 y.o.  Admit date: 02/11/2013 Discharge date: 02/13/2013  Admission Diagnoses:  Osteoarthritis of left knee  Discharge Diagnoses:  Principal Problem:   Osteoarthritis of left knee   Past Medical History  Diagnosis Date  . Hypertension   . Diabetes mellitus without complication     prediabetes  . Heart murmur   . GERD (gastroesophageal reflux disease)   . Arthritis     Surgeries: Procedure(s): LEFT TOTAL KNEE ARTHROPLASTY on 02/11/2013   Consultants (if any):    Discharged Condition: Improved  Hospital Course: Cynthia Wilson is an 47 y.o. female who was admitted 02/11/2013 with a diagnosis of Osteoarthritis of left knee and went to the operating room on 02/11/2013 and underwent the above named procedures.    She was given perioperative antibiotics:  Anti-infectives   Start     Dose/Rate Route Frequency Ordered Stop   02/11/13 1400  ceFAZolin (ANCEF) IVPB 2 g/50 mL premix     2 g 100 mL/hr over 30 Minutes Intravenous Every 6 hours 02/11/13 1235 02/11/13 2021   02/11/13 0937  cefUROXime (ZINACEF) injection  Status:  Discontinued       As needed 02/11/13 0937 02/11/13 1026   02/11/13 0600  ceFAZolin (ANCEF) IVPB 2 g/50 mL premix     2 g 100 mL/hr over 30 Minutes Intravenous On call to O.R. 02/10/13 1436 02/11/13 0802    .  She was given sequential compression devices, early ambulation, and ASA for DVT prophylaxis.  She benefited maximally from the hospital stay and there were no complications.   At time of d/c, she was tol po, pain controlled on PO meds, ambulating with walker and desiring d/c.  Recent vital signs:  Filed Vitals:   02/13/13 0656  BP: 113/67  Pulse: 87  Temp: 98.5 F (36.9 C)  Resp: 16    Recent laboratory studies:  Lab Results  Component Value Date   HGB 9.4* 02/13/2013   HGB 10.0* 02/12/2013   HGB 11.7* 02/01/2013   Lab Results  Component Value  Date   WBC 10.6* 02/13/2013   PLT 256 02/13/2013   Lab Results  Component Value Date   INR 1.04 02/01/2013   Lab Results  Component Value Date   NA 137 02/12/2013   K 3.3* 02/12/2013   CL 100 02/12/2013   CO2 30 02/12/2013   BUN 11 02/12/2013   CREATININE 0.93 02/12/2013   GLUCOSE 137* 02/12/2013    Discharge Medications:     Medication List    TAKE these medications       aspirin EC 325 MG tablet  Take 1 tablet (325 mg total) by mouth 2 (two) times daily after a meal.     methocarbamol 750 MG tablet  Commonly known as:  ROBAXIN-750  One every 8 hrs prn spasm.     oxyCODONE-acetaminophen 5-325 MG per tablet  Commonly known as:  PERCOCET/ROXICET  Take 1-2 tablets by mouth every 6 (six) hours as needed for pain.      ASK your doctor about these medications       fluticasone 50 MCG/ACT nasal spray  Commonly known as:  FLONASE  Place 1 spray into the nose daily.     hydrochlorothiazide 25 MG tablet  Commonly known as:  HYDRODIURIL  Take 1 tablet (25 mg total) by mouth daily.     loratadine 10 MG tablet  Commonly known as:  CLARITIN  Take  10 mg by mouth daily.     metFORMIN 500 MG 24 hr tablet  Commonly known as:  GLUCOPHAGE-XR  Take 500 mg by mouth daily with breakfast.     multivitamin capsule  Take 1 capsule by mouth daily.     omeprazole 20 MG capsule  Commonly known as:  PRILOSEC  Take 1 capsule (20 mg total) by mouth daily. for heartburn        Diagnostic Studies: Dg Chest 2 View  02/01/2013   *RADIOLOGY REPORT*  Clinical Data: Preoperative evaluation for left knee replacement, history hypertension  CHEST - 2 VIEW  Comparison: None  Findings: Upper-normal size of cardiac silhouette. Mediastinal contours and pulmonary vascularity normal. Lungs clear. No pleural effusion or pneumothorax. Bones unremarkable.  IMPRESSION: No acute abnormalities.   Original Report Authenticated By: Ulyses Southward, M.D.    Disposition: 01-Home or Self Care        Follow-up  Information   Follow up with GRAVES,JOHN L, MD. Schedule an appointment as soon as possible for a visit in 2 weeks.   Specialty:  Orthopedic Surgery   Contact information:   [address] [place] [address] [phone]       Follow up with [facility]. (home physical therapy, they will contact you)    Contact information:   [address] [place] [address] [phone]        Signed: Janee Morn, Tavion Senkbeil A. 02/13/2013, 9:03 AM

## 2013-02-14 ENCOUNTER — Encounter (HOSPITAL_COMMUNITY): Payer: Self-pay | Admitting: Orthopedic Surgery

## 2013-03-12 ENCOUNTER — Other Ambulatory Visit: Payer: Self-pay | Admitting: Family Medicine

## 2013-03-24 ENCOUNTER — Other Ambulatory Visit (HOSPITAL_COMMUNITY)
Admission: RE | Admit: 2013-03-24 | Discharge: 2013-03-24 | Disposition: A | Discharge status: Home / Self Care | Payer: Managed Care, Other (non HMO) | Source: Ambulatory Visit | Attending: Family Medicine | Admitting: Family Medicine

## 2013-03-24 ENCOUNTER — Ambulatory Visit (INDEPENDENT_AMBULATORY_CARE_PROVIDER_SITE_OTHER): Payer: Managed Care, Other (non HMO) | Admitting: Family Medicine

## 2013-03-24 ENCOUNTER — Encounter: Payer: Self-pay | Admitting: Family Medicine

## 2013-03-24 VITALS — BP 120/84 | HR 109 | Temp 98.5°F | Wt 257.0 lb

## 2013-03-24 DIAGNOSIS — Z113 Encounter for screening for infections with a predominantly sexual mode of transmission: Secondary | ICD-10-CM | POA: Insufficient documentation

## 2013-03-24 DIAGNOSIS — R1032 Left lower quadrant pain: Secondary | ICD-10-CM

## 2013-03-24 LAB — POCT URINALYSIS DIPSTICK
Leukocytes, UA: NEGATIVE
Nitrite, UA: NEGATIVE
Protein, UA: NEGATIVE
pH, UA: 5.5

## 2013-03-24 MED ORDER — IBUPROFEN 600 MG PO TABS: 600.0000 mg | ORAL_TABLET | Freq: Three times a day (TID) | ORAL | Status: DC | PRN

## 2013-03-24 NOTE — Patient Instructions (Signed)
1) for the scar - try getting some vitamin E tablets and poking a whole in them and spreading the gel on your scar.   2) for the belly pain - you are very tender both on your bladder and in the lower left quadrant - I would like to get an ultrasound of that area to see if the cyst that was found in 2008 was taken care of or if that could be the cause for your pain - also, your urine does show some blood on it so I sent it for culture to see if bacteria grow.

## 2013-03-24 NOTE — Addendum Note (Signed)
Addended by: Vale Haven on: 03/24/2013 03:22 PM   Modules accepted: Orders

## 2013-03-24 NOTE — Progress Notes (Signed)
Subjective:     Patient ID: KYRIA BUMGARDNER, female   DOB: May 20, 1966, 47 y.o.   MRN: [id number]  HPI  47 yo with a hx of DM, HTN, and heart murmur here for acute care visit.   Here for 5 days of LLQ abdominal pain and suprapubic - hurts worst with feeling like she has the urge to urinate - also feels pain with defecation - but also seems to come and go at times.  - initially had some associated nausea but now just pain - pain is sharp stabbing pains that comes and goes - feels like when she had uterine fibroids.   No fevers, chills, vomiting, diarrhea, constipation. No weight changes  - increased urinary frequency - some decreased appetite   Hx of hysterectomy and right salpingoopherectomy. Still has left ovary.   Past Medical History  Diagnosis Date  . Hypertension   . Diabetes mellitus without complication     prediabetes  . Heart murmur   . GERD (gastroesophageal reflux disease)   . Arthritis    History   Social History  . Marital Status: Married    Spouse Name: N/A    Number of Children: N/A  . Years of Education: N/A   Occupational History  . Not on file.   Social History Main Topics  . Smoking status: Never Smoker   . Smokeless tobacco: Not on file  . Alcohol Use: No  . Drug Use: No  . Sexual Activity: Yes    Birth Control/ Protection: Surgical   Other Topics Concern  . Not on file   Social History Narrative   Lives with husband and 82 year old son.  Works as a Scientist, physiological.   Allergies  Allergen Reactions  . Iohexol      Code: HIVES, Desc: Pt called on 9/2/8 and stated she had a 2 day delayed reaction from contrast.  She broke out in hives and a rash all over her body.  She went to urgent care and received a steroid pack. Check w/ rad for premedication before any IV contrast., Onset Date: [id number]   . Other Rash    Wrap used when gave blood   Current Outpatient Prescriptions on File Prior to Visit  Medication Sig Dispense Refill  . aspirin EC  325 MG tablet Take 1 tablet (325 mg total) by mouth 2 (two) times daily after a meal.  60 tablet  0  . fluticasone (FLONASE) 50 MCG/ACT nasal spray Place 1 spray into the nose daily.  16 g  11  . hydrochlorothiazide (HYDRODIURIL) 25 MG tablet TAKE 1 TABLET (25 MG TOTAL) BY MOUTH DAILY.  30 tablet  4  . loratadine (CLARITIN) 10 MG tablet Take 10 mg by mouth daily.        . metFORMIN (GLUCOPHAGE-XR) 500 MG 24 hr tablet Take 500 mg by mouth daily with breakfast.      . methocarbamol (ROBAXIN-750) 750 MG tablet One every 8 hrs prn spasm.  50 tablet  0  . Multiple Vitamin (MULTIVITAMIN) capsule Take 1 capsule by mouth daily.        [name] omeprazole (PRILOSEC) 20 MG capsule Take 1 capsule (20 mg total) by mouth daily. for heartburn  30 capsule  11  . oxyCODONE-acetaminophen (PERCOCET/ROXICET) 5-325 MG per tablet Take 1-2 tablets by mouth every 6 (six) hours as needed for pain.  60 tablet  0   No current facility-administered medications on file prior to visit.   Family History  Problem Relation  Age of Onset  . Diabetes Mother   . Hypertension Mother   . Hyperlipidemia Mother   . Emphysema Father   . Cancer Neg Hx   . Stroke Neg Hx   . Heart disease Neg Hx     Review of Systems See above     Objective:   Physical Exam GEN: NAD, obese CV: RRR, 2/6 SEM PULM: LCTAB ABD: Soft, obese, NT, ND +BS x 4 GU: normal vulva, vagina, vaginal cuff.  TTP on bladder and L adnexa. ?fullness of left adnexa but difficult to tell due to body habitus.  EXT no edema    Assessment:     Abdominal pain, left lower quadrant - Plan: POCT urinalysis dipstick, Urine culture      Plan:      - in review of records, pt had a hx of left adnexal mass in 2008 that does not appear to have been readressed and pt cannot remember whether anything was done about it. Per [place] at that time: "Large (8.0 x 7.2 x 4.3 cm) left adnexal mass which is somewhat tubular in appearance and contains both fluid and a small amount of soft  tissue components. This may represent a hydrosalpinx or tuboovarian abscess. Aless likely consideration is an ovarian neoplasm. Gynecologic consultation is recommended" - I am concerned that her pain is coming from a gynecological etiology - CT abd/pelvis in 2008 did not show evidence of diverticulosis which would also be on the differential.  - will re-order transvaginal US given exam today - recommend ibuprofen for pain control - UA with blood but mild leuks. Will add culture just due to hematuria.  - advised to return or call if symptoms worsen.    Deatrice Spanbauer, Redmond Baseman, MD

## 2013-03-28 ENCOUNTER — Ambulatory Visit (HOSPITAL_COMMUNITY)
Admission: RE | Admit: 2013-03-28 | Discharge: 2013-03-28 | Disposition: A | Discharge status: Home / Self Care | Payer: Managed Care, Other (non HMO) | Source: Ambulatory Visit | Attending: Family Medicine | Admitting: Family Medicine

## 2013-03-28 ENCOUNTER — Other Ambulatory Visit: Payer: Self-pay | Admitting: Family Medicine

## 2013-03-28 DIAGNOSIS — R1032 Left lower quadrant pain: Secondary | ICD-10-CM

## 2013-03-28 DIAGNOSIS — N9489 Other specified conditions associated with female genital organs and menstrual cycle: Secondary | ICD-10-CM | POA: Insufficient documentation

## 2013-03-28 DIAGNOSIS — E119 Type 2 diabetes mellitus without complications: Secondary | ICD-10-CM | POA: Insufficient documentation

## 2013-03-28 DIAGNOSIS — Z90710 Acquired absence of both cervix and uterus: Secondary | ICD-10-CM | POA: Insufficient documentation

## 2013-03-28 DIAGNOSIS — I10 Essential (primary) hypertension: Secondary | ICD-10-CM | POA: Insufficient documentation

## 2013-03-29 ENCOUNTER — Other Ambulatory Visit: Payer: Managed Care, Other (non HMO)

## 2013-03-29 ENCOUNTER — Other Ambulatory Visit: Payer: Self-pay | Admitting: Family Medicine

## 2013-03-29 DIAGNOSIS — N83202 Unspecified ovarian cyst, left side: Secondary | ICD-10-CM

## 2013-03-29 NOTE — Progress Notes (Signed)
CA-125 DONE TODAY Cynthia Wilson 

## 2013-03-30 ENCOUNTER — Ambulatory Visit (HOSPITAL_COMMUNITY): Payer: Managed Care, Other (non HMO)

## 2013-03-30 ENCOUNTER — Encounter: Payer: Self-pay | Admitting: Obstetrics & Gynecology

## 2013-03-30 ENCOUNTER — Other Ambulatory Visit: Payer: Self-pay | Admitting: Family Medicine

## 2013-03-30 ENCOUNTER — Ambulatory Visit (INDEPENDENT_AMBULATORY_CARE_PROVIDER_SITE_OTHER): Payer: Managed Care, Other (non HMO) | Admitting: Obstetrics & Gynecology

## 2013-03-30 VITALS — BP 138/92 | HR 90 | Ht 66.0 in | Wt 257.0 lb

## 2013-03-30 DIAGNOSIS — N83209 Unspecified ovarian cyst, unspecified side: Secondary | ICD-10-CM

## 2013-03-30 LAB — CA 125: CA 125: 3.7 U/mL (ref 0.0–30.2)

## 2013-03-30 NOTE — Progress Notes (Signed)
Subjective:     Patient ID: Cynthia Wilson, female   DOB: Aug 19, 1965, 47 y.o.   MRN: [id number]  HPI Pt reports that she has been having severe pain in her pelvis.  The pain is improved since she received pain meds from Dr. Reola Calkins.  She reports that the pain feels like she is about to have a baby.  She had similar sx in 2008 when she was diagnosed with an ovarian cyst.  She reports that she thought that she may have had surgery at that time but, is not sure.  She denies N/V and she denies weigh loss or any other constitutional sx. She reports that the pain is intermittent.  She reports occ hot flushes already.  She is s/p a hyst with right opporectomy.  Past Medical History  Diagnosis Date  . Hypertension   . Diabetes mellitus without complication     prediabetes  . Heart murmur   . GERD (gastroesophageal reflux disease)   . Arthritis    Past Surgical History  Procedure Laterality Date  . Abdominal hysterectomy  2007    partial, still has her ovaries  . Knee arthroscopy Left 2011  . Hernia repair  81    umbilical  . Total knee arthroplasty Left 02/11/2013    Procedure: LEFT TOTAL KNEE ARTHROPLASTY;  Surgeon: Harvie Junior, MD;  Location: [facility] OR;  Service: Orthopedics;  Laterality: Left;  NO COMPUTER   Current Outpatient Prescriptions on File Prior to Visit  Medication Sig Dispense Refill  . aspirin EC 325 MG tablet Take 1 tablet (325 mg total) by mouth 2 (two) times daily after a meal.  60 tablet  0  . fluticasone (FLONASE) 50 MCG/ACT nasal spray Place 1 spray into the nose daily.  16 g  11  . hydrochlorothiazide (HYDRODIURIL) 25 MG tablet TAKE 1 TABLET (25 MG TOTAL) BY MOUTH DAILY.  30 tablet  4  . ibuprofen (ADVIL,MOTRIN) 600 MG tablet Take 1 tablet (600 mg total) by mouth every 8 (eight) hours as needed for pain.  30 tablet  0  . loratadine (CLARITIN) 10 MG tablet Take 10 mg by mouth daily.        . metFORMIN (GLUCOPHAGE-XR) 500 MG 24 hr tablet Take 500 mg by mouth daily with  breakfast.      . Multiple Vitamin (MULTIVITAMIN) capsule Take 1 capsule by mouth daily.        [name] omeprazole (PRILOSEC) 20 MG capsule Take 1 capsule (20 mg total) by mouth daily. for heartburn  30 capsule  11  . methocarbamol (ROBAXIN-750) 750 MG tablet One every 8 hrs prn spasm.  50 tablet  0  . oxyCODONE-acetaminophen (PERCOCET/ROXICET) 5-325 MG per tablet Take 1-2 tablets by mouth every 6 (six) hours as needed for pain.  60 tablet  0   No current facility-administered medications on file prior to visit.   History   Social History  . Marital Status: Married    Spouse Name: N/A    Number of Children: N/A  . Years of Education: N/A   Occupational History  . Not on file.   Social History Main Topics  . Smoking status: Never Smoker   . Smokeless tobacco: Not on file  . Alcohol Use: No  . Drug Use: No  . Sexual Activity: Yes    Birth Control/ Protection: Surgical   Other Topics Concern  . Not on file   Social History Narrative   Lives with husband and 53 year old son.  Works as a Scientist, physiological.   Family History  Problem Relation Age of Onset  . Diabetes Mother   . Hypertension Mother   . Hyperlipidemia Mother   . Emphysema Father   . Cancer Neg Hx   . Stroke Neg Hx   . Heart disease Neg Hx    History   Social History  . Marital Status: Married    Spouse Name: N/A    Number of Children: N/A  . Years of Education: N/A   Occupational History  . Not on file.   Social History Main Topics  . Smoking status: Never Smoker   . Smokeless tobacco: Not on file  . Alcohol Use: No  . Drug Use: No  . Sexual Activity: Yes    Birth Control/ Protection: Surgical   Other Topics Concern  . Not on file   Social History Narrative   Lives with husband and 43 year old son.  Works as a Scientist, physiological.    Review of Systems     Objective:   Physical Exam BP 138/92  Pulse 90  Ht 5\' 6"  (1.676 m)  Wt 257 lb (116.574 kg)  BMI 41.5 kg/m2 Pt in NAD Lungs: CTA CV: RRR Abd:  obese, NT, ND GU: EGBUS: no lesions Vagina: no blood in vault Cervix/Uterus surgically absent Adnexa: mass on left side extending to midline     03/03/2007 Clinical Data: 47 year old with left ovarian cyst.  TRANSABDOMINAL AND TRANSVAGINAL PELVIC ULTRASOUND:  Technique: Both transabdominal and transvaginal ultrasound examinations of the pelvis were performed including evaluation of the uterus, ovaries, adnexal regions, and pelvic cul-de-sac.  Findings: The patient is status-post hysterectomy. The right ovary is normal in size and appearance.  There is a predominately anechoic (fluid-filled) somewhat tubular appearing left adnexal mass measuring 8.0 x 7.2 x 4.3 cm. There are some associated echoes within this mass which may represent soft tissue components/debris, and there is a small amount of Doppler flow identified in the mass. It is difficult to identify definite ovarian tissue separate from this mass; however there is a 4.8 x 4.0 x 3.9 cm soft tissue density adjacent to the more fluid-filled mass which may represent an ovary. No free fluid is identified in the pelvis.  IMPRESSION:  1. Large (8.0 x 7.2 x 4.3 cm) left adnexal mass which is somewhat tubular in appearance and contains both fluid and a small amount of soft tissue components. This may represent a hydrosalpinx or tuboovarian abscess. Aless likely consideration is an ovarian neoplasm. Gynecologic consultation is recommended.     03/28/2013 EXAM:  TRANSABDOMINAL AND TRANSVAGINAL ULTRASOUND OF PELVIS  TECHNIQUE:  Both transabdominal and transvaginal ultrasound examinations of the  pelvis were performed. Transabdominal technique was performed for  global imaging of the pelvis including uterus, ovaries, adnexal  regions, and pelvic cul-de-sac. It was necessary to proceed with  endovaginal exam following the transabdominal exam to visualize the  left ovary.  COMPARISON: Ultrasound dated 03/02/2017 and CT scan abdomen dated   02/24/2007.  FINDINGS:  Uterus  Removed.  Right ovary  Removed.  Left ovary  There is a large cystic lesion in the left adnexum measuring 9.0 x  8.1 x 7.6 cm with an adjacent 3.8 x 3.3 x 1.7 cm cyst extending to  the right.  Other findings  No free fluid.  IMPRESSION:  Cystic lesions in the pelvis, probably arising from the left ovary.  These cysts were seen previously on 03/03/2007. The larger one has  increased from a 8.0 x  7.2 x 4.3 cm to 9.0 x 8.1 x 7.6 cm. The  smaller one has diminished in size from 4.8 x 4.0 x 3.9 cm to 3.8 x  3.3 x 1.7 cm. Considering the minimal changes, these are most likely  benign.      Assessment:     ovarian cyst- noted since 2008 with only minor change in size.  Suspect benign in nature.  Appears unilocular on sono will attempt to per procedure laparoscopically. Pt wants surgical eval. Reviewed risks and alternatives with pt.  She recently had knee replacement and is not supposed to bend her knee.        Plan:     Pt will call after seeing her ortho physician this week to determine the best date for surgery. Patient desires surgical management with laparoscopy with left salpingoophorectomy and possible laparotomy.  The risks of surgery were discussed in detail with the patient including but not limited to: bleeding which may require transfusion or reoperation; infection which may require prolonged hospitalization or re-hospitalization and antibiotic therapy; injury to bowel, bladder, ureters and major vessels or other surrounding organs; need for additional procedures including laparotomy; thromboembolic phenomenon, incisional problems and other postoperative or anesthesia complications.  Patient was told that the likelihood that her condition and symptoms will be treated effectively with this surgical management was very high; the postoperative expectations were also discussed in detail. The patient also understands the alternative treatment options  which were discussed in full. All questions were answered.  She was told that she will be contacted by our surgical scheduler regarding the time and date of her surgery AFTER she calls and gives [place] the date after which her ortho physician feel it is ok for her to have surgery with her knee bent! routime preoperative instructions of having nothing to eat or drink after midnight on the day prior to surgery and also coming to the hospital 1 1/2 hours prior to her time of surgery were also emphasized.  She was told she may be called for a preoperative appointment about a week prior to surgery and will be given further preoperative instructions at that visit. Printed patient education handouts about the procedure were given to the patient to review at home.

## 2013-03-30 NOTE — Progress Notes (Signed)
The referral was placed in women's workqueue and they will call pt and schedule appt.  Geraline Halberstadt,CMA

## 2013-03-30 NOTE — Progress Notes (Signed)
Hi ladies. I placed a referral for this pt to come to the gyn clinic. She likely needs surgery for that cystic mass on her ovary.  How do I go about making sure that appointment gets scheduled?

## 2013-03-30 NOTE — Progress Notes (Signed)
Awesome! Thank you so much!

## 2013-03-30 NOTE — Patient Instructions (Addendum)
Ovarian Cyst The ovaries are small organs that are on each side of the uterus. The ovaries are the organs that produce the female hormones, estrogen and progesterone. An ovarian cyst is a sac filled with fluid that can vary in its size. It is normal for a small cyst to form in women who are in the childbearing age and who have menstrual periods. This type of cyst is called a follicle cyst that becomes an ovulation cyst (corpus luteum cyst) after it produces the women's egg. It later goes away on its own if the woman does not become pregnant. There are other kinds of ovarian cysts that may cause problems and may need to be treated. The most serious problem is a cyst with cancer. It should be noted that menopausal women who have an ovarian cyst are at a higher risk of it being a cancer cyst. They should be evaluated very quickly, thoroughly and followed closely. This is especially true in menopausal women because of the high rate of ovarian cancer in women in menopause. CAUSES AND TYPES OF OVARIAN CYSTS:  FUNCTIONAL CYST: The follicle/corpus luteum cyst is a functional cyst that occurs every month during ovulation with the menstrual cycle. They go away with the next menstrual cycle if the woman does not get pregnant. Usually, there are no symptoms with a functional cyst.  ENDOMETRIOMA CYST: This cyst develops from the lining of the uterus tissue. This cyst gets in or on the ovary. It grows every month from the bleeding during the menstrual period. It is also called a "chocolate cyst" because it becomes filled with blood that turns brown. This cyst can cause pain in the lower abdomen during intercourse and with your menstrual period.  CYSTADENOMA CYST: This cyst develops from the cells on the outside of the ovary. They usually are not cancerous. They can get very big and cause lower abdomen pain and pain with intercourse. This type of cyst can twist on itself, cut off its blood supply and cause severe pain. It  also can easily rupture and cause a lot of pain.  DERMOID CYST: This type of cyst is sometimes found in both ovaries. They are found to have different kinds of body tissue in the cyst. The tissue includes skin, teeth, hair, and/or cartilage. They usually do not have symptoms unless they get very big. Dermoid cysts are rarely cancerous.  POLYCYSTIC OVARY: This is a rare condition with hormone problems that produces many small cysts on both ovaries. The cysts are follicle-like cysts that never produce an egg and become a corpus luteum. It can cause an increase in body weight, infertility, acne, increase in body and facial hair and lack of menstrual periods or rare menstrual periods. Many women with this problem develop type 2 diabetes. The exact cause of this problem is unknown. A polycystic ovary is rarely cancerous.  THECA LUTEIN CYST: Occurs when too much hormone (human chorionic gonadotropin) is produced and over-stimulates the ovaries to produce an egg. They are frequently seen when doctors stimulate the ovaries for invitro-fertilization (test tube babies).  LUTEOMA CYST: This cyst is seen during pregnancy. Rarely it can cause an obstruction to the birth canal during labor and delivery. They usually go away after delivery. SYMPTOMS   Pelvic pain or pressure.  Pain during sexual intercourse.  Increasing girth (swelling) of the abdomen.  Abnormal menstrual periods.  Increasing pain with menstrual periods.  You stop having menstrual periods and you are not pregnant. DIAGNOSIS  The diagnosis can   be made during:  Routine or annual pelvic examination (common).  Ultrasound.  X-ray of the pelvis.  CT Scan.  MRI.  Blood tests. TREATMENT   Treatment may only be to follow the cyst monthly for 2 to 3 months with your caregiver. Many go away on their own, especially functional cysts.  May be aspirated (drained) with a long needle with ultrasound, or by laparoscopy (inserting a tube into  the pelvis through a small incision).  The whole cyst can be removed by laparoscopy.  Sometimes the cyst may need to be removed through an incision in the lower abdomen.  Hormone treatment is sometimes used to help dissolve certain cysts.  Birth control pills are sometimes used to help dissolve certain cysts. HOME CARE INSTRUCTIONS  Follow your caregiver's advice regarding:  Medicine.  Follow up visits to evaluate and treat the cyst.  You may need to come back or make an appointment with another caregiver, to find the exact cause of your cyst, if your caregiver is not a gynecologist.  Get your yearly and recommended pelvic examinations and Pap tests.  Let your caregiver know if you have had an ovarian cyst in the past. SEEK MEDICAL CARE IF:   Your periods are late, irregular, they stop, or are painful.  Your stomach (abdomen) or pelvic pain does not go away.  Your stomach becomes larger or swollen.  You have pressure on your bladder or trouble emptying your bladder completely.  You have painful sexual intercourse.  You have feelings of fullness, pressure, or discomfort in your stomach.  You lose weight for no apparent reason.  You feel generally ill.  You become constipated.  You lose your appetite.  You develop acne.  You have an increase in body and facial hair.  You are gaining weight, without changing your exercise and eating habits.  You think you are pregnant. SEEK IMMEDIATE MEDICAL CARE IF:   You have increasing abdominal pain.  You feel sick to your stomach (nausea) and/or vomit.  You develop a fever that comes on suddenly.  You develop abdominal pain during a bowel movement.  Your menstrual periods become heavier than usual. Document Released: 06/16/2005 Document Revised: 09/08/2011 Document Reviewed: 04/19/2009 [employer] Patient Information 2014 [employer]. Diagnostic Laparoscopy Laparoscopy is a surgical procedure. It is used to diagnose  and treat diseases inside the belly(abdomen). It is usually a brief, common, and relatively simple procedure. The laparoscopeis a thin, lighted, pencil-sized instrument. It is like a telescope. It is inserted into your abdomen through a small cut (incision). Your caregiver can look at the organs inside your body through this instrument. He or she can see if there is anything abnormal. Laparoscopy can be done either in a hospital or outpatient clinic. You may be given a mild sedative to help you relax before the procedure. Once in the operating room, you will be given a drug to make you sleep (general anesthesia). Laparoscopy usually lasts less than 1 hour. After the procedure, you will be monitored in a recovery area until you are stable and doing well. Once you are home, it will take 2 to 3 days to fully recover. RISKS AND COMPLICATIONS  Laparoscopy has relatively few risks. Your caregiver will discuss the risks with you before the procedure. Some problems that can occur include:  Infection.  Bleeding.  Damage to other organs.  Anesthetic side effects. PROCEDURE Once you receive anesthesia, your surgeon inflates the abdomen with a harmless gas (carbon dioxide). This makes the organs   easier to see. The laparoscope is inserted into the abdomen through a small incision. This allows your surgeon to see into the abdomen. Other small instruments are also inserted into the abdomen through other small openings. Many surgeons attach a video camera to the laparoscope to enlarge the view. During a diagnostic laparoscopy, the surgeon may be looking for inflammation, infection, or cancer. Your surgeon may take tissue samples(biopsies). The samples are sent to a specialist in looking at cells and tissue samples (pathologist). The pathologist examines them under a microscope. Biopsies can help to diagnose or confirm a disease. AFTER THE PROCEDURE   The gas is released from inside the abdomen.  The incisions  are closed with stitches (sutures). Because these incisions are small (usually less than 1/2 inch), there is usually minimal discomfort after the procedure. There may be some mild discomfort in the throat. This is from the tube placed in the throat while you were sleeping. You may have some mild abdominal discomfort. There may also be discomfort from the instrument placement incisions in the abdomen.  The recovery time is shortened as long as there are no complications.  You will rest in a recovery room until stable and doing well. As long as there are no complications, you may be allowed to go home. FINDING OUT THE RESULTS OF YOUR TEST Not all test results are available during your visit. If your test results are not back during the visit, make an appointment with your caregiver to find out the results. Do not assume everything is normal if you have not heard from your caregiver or the medical facility. It is important for you to follow up on all of your test results. HOME CARE INSTRUCTIONS   Take all medicines as directed.  Only take over-the-counter or prescription medicines for pain, discomfort, or fever as directed by your caregiver.  Resume daily activities as directed.  Showers are preferred over baths.  You may resume sexual activities in 1 week or as directed.  Do not drive while taking narcotics. SEEK MEDICAL CARE IF:   There is increasing abdominal pain.  There is new pain in the shoulders (shoulder strap areas).  You feel lightheaded or faint.  You have the chills.  You or your child has an oral temperature above 102 F (38.9 C).  There is pus-like (purulent) drainage from any of the wounds.  You are unable to pass gas or have a bowel movement.  You feel sick to your stomach (nauseous) or throw up (vomit). MAKE SURE YOU:   Understand these instructions.  Will watch your condition.  Will get help right away if you are not doing well or get worse. Document  Released: 09/22/2000 Document Revised: 09/08/2011 Document Reviewed: 06/16/2007 [employer] Patient Information 2014 [employer].  

## 2013-03-31 ENCOUNTER — Telehealth: Payer: Self-pay | Admitting: *Deleted

## 2013-03-31 NOTE — Telephone Encounter (Signed)
Patient called and said she is trying to leave a message for Dr Erin Fulling. Please call me back.

## 2013-04-05 NOTE — Telephone Encounter (Signed)
Returned pt's call and she stated that Dr. Erin Fulling had asked her to check with her ortho doctor to be sure her knee can be in a flexed position for surgery.  Pt stated that her doctor said her knee is fine now and can be in a bent or flexed position. I advised pt that I will forward this information to Dr. Erin Fulling and she will be contacted in the coming weeks regarding her surgery date.  Pt voiced understanding.

## 2013-04-13 ENCOUNTER — Encounter (HOSPITAL_COMMUNITY): Payer: Self-pay | Admitting: Pharmacist

## 2013-04-14 ENCOUNTER — Encounter (HOSPITAL_COMMUNITY)
Admission: RE | Admit: 2013-04-14 | Discharge: 2013-04-14 | Disposition: A | Discharge status: Home / Self Care | Payer: Managed Care, Other (non HMO) | Source: Ambulatory Visit | Attending: Obstetrics & Gynecology | Admitting: Obstetrics & Gynecology

## 2013-04-14 ENCOUNTER — Encounter (HOSPITAL_COMMUNITY): Payer: Self-pay

## 2013-04-14 DIAGNOSIS — Z01812 Encounter for preprocedural laboratory examination: Secondary | ICD-10-CM | POA: Insufficient documentation

## 2013-04-14 DIAGNOSIS — Z01818 Encounter for other preprocedural examination: Secondary | ICD-10-CM | POA: Insufficient documentation

## 2013-04-14 LAB — BASIC METABOLIC PANEL
BUN: 12 mg/dL (ref 6–23)
CO2: 31 mEq/L (ref 19–32)
GFR calc Af Amer: >90 mL/min (ref >90)
GFR calc non Af Amer: 84 mL/min — ABNORMAL LOW (ref >90)
Potassium: 3.3 mEq/L — ABNORMAL LOW (ref 3.5–5.1)
Sodium: 138 mEq/L (ref 135–145)

## 2013-04-14 LAB — CBC
HCT: 36.3 % (ref 36.0–46.0)
MCHC: 30.9 g/dL (ref 30.0–36.0)
Platelets: 373 10*3/uL (ref 150–400)
RDW: 14.9 % (ref 11.5–15.5)

## 2013-04-14 NOTE — Patient Instructions (Signed)
20 Cynthia Wilson  04/14/2013   Your procedure is scheduled on:  04/21/13  Enter through the Main Entrance of [facility] at 730 AM.  Pick up the phone at the desk and dial [phone].   Call this number if you have problems the morning of surgery: [phone]   Remember:   Do not eat food:After Midnight.  Do not drink clear liquids: After Midnight.  Take these medicines the morning of surgery with A SIP OF WATER: take blood pressure medication, hold Metformin for 24hrs prior to surgery.   Do not wear jewelry, make-up or nail polish.  Do not wear lotions, powders, or perfumes. You may wear deodorant.  Do not shave 48 hours prior to surgery.  Do not bring valuables to the hospital.  [facility] is not   responsible for any belongings or valuables brought to the hospital.  Contacts, dentures or bridgework may not be worn into surgery.  Leave suitcase in the car. After surgery it may be brought to your room.  For patients admitted to the hospital, checkout time is 11:00 AM the day of              discharge.   Patients discharged the day of surgery will not be allowed to drive             home.  Name and phone number of your driver: undecided  Special Instructions:   Shower using CHG 2 nights before surgery and the night before surgery.  If you shower the day of surgery use CHG.  Use special wash - you have one bottle of CHG for all showers.  You should use approximately 1/3 of the bottle for each shower.   Please read over the following fact sheets that you were given:   Surgical Site Infection Prevention

## 2013-04-19 ENCOUNTER — Encounter: Payer: Self-pay | Admitting: *Deleted

## 2013-04-21 ENCOUNTER — Encounter (HOSPITAL_COMMUNITY): Payer: Managed Care, Other (non HMO) | Admitting: Anesthesiology

## 2013-04-21 ENCOUNTER — Observation Stay (HOSPITAL_COMMUNITY)
Admission: RE | Admit: 2013-04-21 | Discharge: 2013-04-22 | DRG: 743 | Disposition: A | Discharge status: Home / Self Care | Payer: Managed Care, Other (non HMO) | Source: Ambulatory Visit | Attending: Obstetrics & Gynecology | Admitting: Obstetrics & Gynecology

## 2013-04-21 ENCOUNTER — Ambulatory Visit (HOSPITAL_COMMUNITY): Payer: Managed Care, Other (non HMO) | Admitting: Anesthesiology

## 2013-04-21 ENCOUNTER — Encounter (HOSPITAL_COMMUNITY)
Admission: RE | Disposition: A | Discharge status: Home / Self Care | Payer: Self-pay | Source: Ambulatory Visit | Attending: Obstetrics & Gynecology

## 2013-04-21 ENCOUNTER — Encounter (HOSPITAL_COMMUNITY): Payer: Self-pay | Admitting: Obstetrics & Gynecology

## 2013-04-21 DIAGNOSIS — N83209 Unspecified ovarian cyst, unspecified side: Principal | ICD-10-CM | POA: Diagnosis present

## 2013-04-21 DIAGNOSIS — N736 Female pelvic peritoneal adhesions (postinfective): Secondary | ICD-10-CM | POA: Diagnosis present

## 2013-04-21 DIAGNOSIS — N9489 Other specified conditions associated with female genital organs and menstrual cycle: Secondary | ICD-10-CM | POA: Diagnosis present

## 2013-04-21 DIAGNOSIS — Z5331 Laparoscopic surgical procedure converted to open procedure: Secondary | ICD-10-CM

## 2013-04-21 HISTORY — PX: SALPINGOOPHORECTOMY: SHX82

## 2013-04-21 HISTORY — PX: LAPAROTOMY: SHX154

## 2013-04-21 HISTORY — PX: LAPAROSCOPY: SHX197

## 2013-04-21 HISTORY — DX: Female pelvic peritoneal adhesions (postinfective): N73.6

## 2013-04-21 LAB — TYPE AND SCREEN
ABO/RH(D): O POS
Antibody Screen: NEGATIVE

## 2013-04-21 LAB — GLUCOSE, CAPILLARY
Glucose-Capillary: 115 mg/dL — ABNORMAL HIGH (ref 70–99)
Glucose-Capillary: 132 mg/dL — ABNORMAL HIGH (ref 70–99)

## 2013-04-21 SURGERY — SALPINGO-OOPHORECTOMY, OPEN
Anesthesia: General | Site: Abdomen

## 2013-04-21 MED ORDER — CEFAZOLIN SODIUM-DEXTROSE 2-3 GM-% IV SOLR
2.0000 g | INTRAVENOUS | Status: AC
  Administered 2013-04-21: 2 g via INTRAVENOUS

## 2013-04-21 MED ORDER — KETOROLAC TROMETHAMINE 30 MG/ML IJ SOLN
30.0000 mg | Freq: Four times a day (QID) | INTRAMUSCULAR | Status: DC
  Administered 2013-04-21 – 2013-04-22 (×2): 30 mg via INTRAVENOUS
  Filled 2013-04-21 (×3): qty 1

## 2013-04-21 MED ORDER — DOCUSATE SODIUM 100 MG PO CAPS
100.0000 mg | ORAL_CAPSULE | Freq: Two times a day (BID) | ORAL | Status: DC
  Administered 2013-04-21 – 2013-04-22 (×2): 100 mg via ORAL
  Filled 2013-04-21 (×2): qty 1

## 2013-04-21 MED ORDER — IBUPROFEN 800 MG PO TABS
800.0000 mg | ORAL_TABLET | Freq: Three times a day (TID) | ORAL | Status: DC | PRN
  Administered 2013-04-22: 800 mg via ORAL
  Filled 2013-04-21: qty 1

## 2013-04-21 MED ORDER — HYDROMORPHONE HCL PF 1 MG/ML IJ SOLN
INTRAMUSCULAR | Status: AC
  Filled 2013-04-21: qty 1

## 2013-04-21 MED ORDER — ONDANSETRON HCL 4 MG PO TABS: 4.0000 mg | ORAL_TABLET | Freq: Four times a day (QID) | ORAL | Status: DC | PRN

## 2013-04-21 MED ORDER — KETOROLAC TROMETHAMINE 30 MG/ML IJ SOLN
INTRAMUSCULAR | Status: AC
  Filled 2013-04-21: qty 1

## 2013-04-21 MED ORDER — HYDROCHLOROTHIAZIDE 25 MG PO TABS
25.0000 mg | ORAL_TABLET | Freq: Every day | ORAL | Status: DC
  Administered 2013-04-22: 25 mg via ORAL
  Filled 2013-04-21 (×2): qty 1

## 2013-04-21 MED ORDER — ONDANSETRON HCL 4 MG/2ML IJ SOLN
INTRAMUSCULAR | Status: AC
  Filled 2013-04-21: qty 2

## 2013-04-21 MED ORDER — ONDANSETRON HCL 4 MG/2ML IJ SOLN
INTRAMUSCULAR | Status: DC | PRN
  Administered 2013-04-21: 4 mg via INTRAMUSCULAR

## 2013-04-21 MED ORDER — GLYCOPYRROLATE 0.2 MG/ML IJ SOLN
INTRAMUSCULAR | Status: AC
  Filled 2013-04-21: qty 3

## 2013-04-21 MED ORDER — SODIUM CHLORIDE 0.9 % IJ SOLN: 9.0000 mL | INTRAMUSCULAR | Status: DC | PRN

## 2013-04-21 MED ORDER — KETOROLAC TROMETHAMINE 30 MG/ML IJ SOLN: 30.0000 mg | Freq: Once | INTRAMUSCULAR | Status: DC

## 2013-04-21 MED ORDER — METFORMIN HCL ER 500 MG PO TB24
500.0000 mg | ORAL_TABLET | Freq: Every day | ORAL | Status: DC
  Administered 2013-04-22: 500 mg via ORAL
  Filled 2013-04-21 (×2): qty 1

## 2013-04-21 MED ORDER — POTASSIUM CHLORIDE IN NACL 40-0.9 MEQ/L-% IV SOLN
INTRAVENOUS | Status: DC
  Administered 2013-04-21 – 2013-04-22 (×3): via INTRAVENOUS
  Filled 2013-04-21 (×4): qty 1000

## 2013-04-21 MED ORDER — MIDAZOLAM HCL 2 MG/2ML IJ SOLN
INTRAMUSCULAR | Status: DC | PRN
  Administered 2013-04-21: 2 mg via INTRAVENOUS

## 2013-04-21 MED ORDER — DEXAMETHASONE SODIUM PHOSPHATE 10 MG/ML IJ SOLN
INTRAMUSCULAR | Status: AC
  Filled 2013-04-21: qty 1

## 2013-04-21 MED ORDER — SODIUM CHLORIDE 0.9 % IJ SOLN
INTRAMUSCULAR | Status: AC
  Filled 2013-04-21: qty 10

## 2013-04-21 MED ORDER — ROCURONIUM BROMIDE 100 MG/10ML IV SOLN
INTRAVENOUS | Status: AC
  Filled 2013-04-21: qty 1

## 2013-04-21 MED ORDER — SIMETHICONE 80 MG PO CHEW: 80.0000 mg | CHEWABLE_TABLET | Freq: Four times a day (QID) | ORAL | Status: DC | PRN

## 2013-04-21 MED ORDER — GLYCOPYRROLATE 0.2 MG/ML IJ SOLN
INTRAMUSCULAR | Status: DC | PRN
  Administered 2013-04-21: 0.6 mg via INTRAVENOUS

## 2013-04-21 MED ORDER — LIDOCAINE HCL (CARDIAC) 20 MG/ML IV SOLN
INTRAVENOUS | Status: AC
  Filled 2013-04-21: qty 5

## 2013-04-21 MED ORDER — METOCLOPRAMIDE HCL 5 MG/ML IJ SOLN: 10.0000 mg | Freq: Once | INTRAMUSCULAR | Status: DC | PRN

## 2013-04-21 MED ORDER — PROPOFOL 10 MG/ML IV BOLUS
INTRAVENOUS | Status: DC | PRN
  Administered 2013-04-21: 180 mg via INTRAVENOUS

## 2013-04-21 MED ORDER — NEOSTIGMINE METHYLSULFATE 1 MG/ML IJ SOLN
INTRAMUSCULAR | Status: DC | PRN
  Administered 2013-04-21: 4 mg via INTRAVENOUS

## 2013-04-21 MED ORDER — LACTATED RINGERS IV SOLN: INTRAVENOUS | Status: DC

## 2013-04-21 MED ORDER — METHOCARBAMOL 750 MG PO TABS
750.0000 mg | ORAL_TABLET | Freq: Three times a day (TID) | ORAL | Status: DC | PRN
  Filled 2013-04-21: qty 1

## 2013-04-21 MED ORDER — DEXAMETHASONE SODIUM PHOSPHATE 10 MG/ML IJ SOLN
INTRAMUSCULAR | Status: DC | PRN
  Administered 2013-04-21: 10 mg via INTRAVENOUS

## 2013-04-21 MED ORDER — DIPHENHYDRAMINE HCL 50 MG/ML IJ SOLN: 12.5000 mg | Freq: Four times a day (QID) | INTRAMUSCULAR | Status: DC | PRN

## 2013-04-21 MED ORDER — ONDANSETRON HCL 4 MG/2ML IJ SOLN: 4.0000 mg | Freq: Four times a day (QID) | INTRAMUSCULAR | Status: DC | PRN

## 2013-04-21 MED ORDER — PHENYLEPHRINE 40 MCG/ML (10ML) SYRINGE FOR IV PUSH (FOR BLOOD PRESSURE SUPPORT)
PREFILLED_SYRINGE | INTRAVENOUS | Status: AC
  Filled 2013-04-21: qty 5

## 2013-04-21 MED ORDER — FENTANYL CITRATE 0.05 MG/ML IJ SOLN
INTRAMUSCULAR | Status: AC
  Filled 2013-04-21: qty 5

## 2013-04-21 MED ORDER — LIDOCAINE HCL (CARDIAC) 20 MG/ML IV SOLN
INTRAVENOUS | Status: DC | PRN
  Administered 2013-04-21: 30 mg via INTRAVENOUS
  Administered 2013-04-21: 70 mg via INTRAVENOUS

## 2013-04-21 MED ORDER — MENTHOL 3 MG MT LOZG: 1.0000 | LOZENGE | OROMUCOSAL | Status: DC | PRN

## 2013-04-21 MED ORDER — ROCURONIUM BROMIDE 100 MG/10ML IV SOLN
INTRAVENOUS | Status: DC | PRN
  Administered 2013-04-21: 20 mg via INTRAVENOUS
  Administered 2013-04-21: 30 mg via INTRAVENOUS

## 2013-04-21 MED ORDER — NALOXONE HCL 0.4 MG/ML IJ SOLN: 0.4000 mg | INTRAMUSCULAR | Status: DC | PRN

## 2013-04-21 MED ORDER — FENTANYL CITRATE 0.05 MG/ML IJ SOLN
INTRAMUSCULAR | Status: AC
  Filled 2013-04-21: qty 2

## 2013-04-21 MED ORDER — FENTANYL CITRATE 0.05 MG/ML IJ SOLN
INTRAMUSCULAR | Status: DC | PRN
  Administered 2013-04-21: 50 ug via INTRAVENOUS
  Administered 2013-04-21: 100 ug via INTRAVENOUS
  Administered 2013-04-21 (×2): 50 ug via INTRAVENOUS

## 2013-04-21 MED ORDER — SODIUM CHLORIDE 0.9 % IJ SOLN
INTRAMUSCULAR | Status: DC | PRN
  Administered 2013-04-21: 10 mL

## 2013-04-21 MED ORDER — MEPERIDINE HCL 25 MG/ML IJ SOLN: 6.2500 mg | INTRAMUSCULAR | Status: DC | PRN

## 2013-04-21 MED ORDER — BUPIVACAINE HCL (PF) 0.25 % IJ SOLN
INTRAMUSCULAR | Status: AC
  Filled 2013-04-21: qty 30

## 2013-04-21 MED ORDER — BUPIVACAINE HCL (PF) 0.25 % IJ SOLN
INTRAMUSCULAR | Status: DC | PRN
  Administered 2013-04-21: 30 mL

## 2013-04-21 MED ORDER — PROPOFOL 10 MG/ML IV EMUL
INTRAVENOUS | Status: AC
  Filled 2013-04-21: qty 20

## 2013-04-21 MED ORDER — KETOROLAC TROMETHAMINE 30 MG/ML IJ SOLN: 30.0000 mg | Freq: Four times a day (QID) | INTRAMUSCULAR | Status: DC

## 2013-04-21 MED ORDER — PANTOPRAZOLE SODIUM 40 MG IV SOLR
40.0000 mg | Freq: Every day | INTRAVENOUS | Status: DC
  Administered 2013-04-21: 40 mg via INTRAVENOUS
  Filled 2013-04-21 (×3): qty 40

## 2013-04-21 MED ORDER — LACTATED RINGERS IV SOLN
INTRAVENOUS | Status: DC
  Administered 2013-04-21 (×2): via INTRAVENOUS

## 2013-04-21 MED ORDER — NEOSTIGMINE METHYLSULFATE 1 MG/ML IJ SOLN
INTRAMUSCULAR | Status: AC
  Filled 2013-04-21: qty 1

## 2013-04-21 MED ORDER — OXYCODONE-ACETAMINOPHEN 5-325 MG PO TABS
1.0000 | ORAL_TABLET | ORAL | Status: DC | PRN
  Administered 2013-04-22: 1 via ORAL
  Filled 2013-04-21: qty 1

## 2013-04-21 MED ORDER — PHENYLEPHRINE HCL 10 MG/ML IJ SOLN
INTRAMUSCULAR | Status: DC | PRN
  Administered 2013-04-21: 80 ug via INTRAVENOUS
  Administered 2013-04-21: 40 ug via INTRAVENOUS
  Administered 2013-04-21 (×2): 80 ug via INTRAVENOUS

## 2013-04-21 MED ORDER — DEXTROSE 5 % IV SOLN
10.0000 mg | INTRAVENOUS | Status: DC | PRN
  Administered 2013-04-21: 40 ug/min via INTRAVENOUS

## 2013-04-21 MED ORDER — CEFAZOLIN SODIUM-DEXTROSE 2-3 GM-% IV SOLR
INTRAVENOUS | Status: AC
  Filled 2013-04-21: qty 50

## 2013-04-21 MED ORDER — PHENYLEPHRINE HCL 10 MG/ML IJ SOLN
INTRAMUSCULAR | Status: AC
  Filled 2013-04-21: qty 1

## 2013-04-21 MED ORDER — MORPHINE SULFATE (PF) 1 MG/ML IV SOLN
INTRAVENOUS | Status: DC
  Administered 2013-04-21: 1 mg via INTRAVENOUS
  Administered 2013-04-21: 13:00:00 via INTRAVENOUS
  Filled 2013-04-21: qty 25

## 2013-04-21 MED ORDER — MIDAZOLAM HCL 2 MG/2ML IJ SOLN
INTRAMUSCULAR | Status: AC
  Filled 2013-04-21: qty 2

## 2013-04-21 MED ORDER — DIPHENHYDRAMINE HCL 12.5 MG/5ML PO ELIX: 12.5000 mg | ORAL_SOLUTION | Freq: Four times a day (QID) | ORAL | Status: DC | PRN

## 2013-04-21 MED ORDER — KETOROLAC TROMETHAMINE 30 MG/ML IJ SOLN
15.0000 mg | Freq: Once | INTRAMUSCULAR | Status: AC | PRN
  Administered 2013-04-21: 30 mg via INTRAVENOUS

## 2013-04-21 MED ORDER — HYDROMORPHONE HCL PF 1 MG/ML IJ SOLN
0.2500 mg | INTRAMUSCULAR | Status: DC | PRN
  Administered 2013-04-21 (×4): 0.5 mg via INTRAVENOUS

## 2013-04-21 MED ORDER — FENTANYL CITRATE 0.05 MG/ML IJ SOLN
25.0000 ug | INTRAMUSCULAR | Status: DC | PRN
  Administered 2013-04-21 (×2): 50 ug via INTRAVENOUS

## 2013-04-21 SURGICAL SUPPLY — 34 items
ADH SKN CLS APL DERMABOND .7 (GAUZE/BANDAGES/DRESSINGS) ×3
APL SKNCLS STERI-STRIP NONHPOA (GAUZE/BANDAGES/DRESSINGS) ×3
BARRIER ADHS 3X4 INTERCEED (GAUZE/BANDAGES/DRESSINGS) ×2 IMPLANT
BENZOIN TINCTURE PRP APPL 2/3 (GAUZE/BANDAGES/DRESSINGS) ×2 IMPLANT
BRR ADH 4X3 ABS CNTRL BYND (GAUZE/BANDAGES/DRESSINGS) ×3
CHLORAPREP W/TINT 26ML (MISCELLANEOUS) ×4 IMPLANT
CLOTH BEACON ORANGE TIMEOUT ST (SAFETY) ×4 IMPLANT
DERMABOND ADVANCED (GAUZE/BANDAGES/DRESSINGS) ×1
DERMABOND ADVANCED .7 DNX12 (GAUZE/BANDAGES/DRESSINGS) ×1 IMPLANT
DISSECTOR SPONGE CHERRY (GAUZE/BANDAGES/DRESSINGS) ×2 IMPLANT
DRESSING TELFA 8X3 (GAUZE/BANDAGES/DRESSINGS) ×2 IMPLANT
GLOVE BIO SURGEON STRL SZ7 (GLOVE) ×4 IMPLANT
GLOVE BIOGEL PI IND STRL 7.0 (GLOVE) ×3 IMPLANT
GLOVE BIOGEL PI INDICATOR 7.0 (GLOVE) ×1
GOWN PREVENTION PLUS LG XLONG (DISPOSABLE) ×8 IMPLANT
GOWN PREVENTION PLUS XLARGE (GOWN DISPOSABLE) ×6 IMPLANT
NEEDLE INSUFFLATION 120MM (ENDOMECHANICALS) ×4 IMPLANT
NS IRRIG 1000ML POUR BTL (IV SOLUTION) ×4 IMPLANT
PACK LAPAROSCOPY BASIN (CUSTOM PROCEDURE TRAY) ×4 IMPLANT
PAD ABD 7.5X8 STRL (GAUZE/BANDAGES/DRESSINGS) ×2 IMPLANT
PROTECTOR NERVE ULNAR (MISCELLANEOUS) ×6 IMPLANT
STRIP CLOSURE SKIN 1/2X4 (GAUZE/BANDAGES/DRESSINGS) ×3 IMPLANT
SUT PDS AB 0 CT1 27 (SUTURE) ×2 IMPLANT
SUT VIC AB 0 CT1 18XCR BRD8 (SUTURE) ×2 IMPLANT
SUT VIC AB 0 CT1 8-18 (SUTURE) ×8
SUT VIC AB 3-0 CT1 27 (SUTURE) ×8
SUT VIC AB 3-0 CT1 TAPERPNT 27 (SUTURE) ×2 IMPLANT
SUT VICRYL 0 UR6 27IN ABS (SUTURE) ×8 IMPLANT
SUT VICRYL 4-0 PS2 18IN ABS (SUTURE) ×4 IMPLANT
TAPE CLOTH SURG 4X10 WHT LF (GAUZE/BANDAGES/DRESSINGS) ×2 IMPLANT
TOWEL OR 17X24 6PK STRL BLUE (TOWEL DISPOSABLE) ×11 IMPLANT
TRAY FOLEY CATH 14FR (SET/KITS/TRAYS/PACK) ×4 IMPLANT
TROCAR OPTI TIP 5M 100M (ENDOMECHANICALS) ×2 IMPLANT
WATER STERILE IRR 1000ML POUR (IV SOLUTION) ×4 IMPLANT

## 2013-04-21 NOTE — Op Note (Signed)
04/21/2013  10:43 AM  PATIENT:  Cynthia Wilson  47 y.o. female  PRE-OPERATIVE DIAGNOSIS:  Left 9 cm ovarian cyst - cpt 40981   POST-OPERATIVE DIAGNOSIS:  Left 9 cm ovarian cyst - cpt 19147; extensive pelvic adhesions    PROCEDURE: Diagnostic laparoscopy with conversion to laparotomy with left adnexal cystectomy; lysis of adhesions  SURGEON:  Surgeon(s) and Role:    * Willodean Rosenthal, MD - Primary    * Allie Bossier, MD - Assisting  ANESTHESIA:   general  EBL:  Total I/O In: 500 [I.V.:500] Out: 150 [Urine:100; Blood:50]  BLOOD ADMINISTERED:none  DRAINS: none   LOCAL MEDICATIONS USED:  MARCAINE     SPECIMEN: cyst wall  DISPOSITION OF SPECIMEN:  PATHOLOGY  COUNTS:  YES  TOURNIQUET:  * No tourniquets in log *  DICTATION: .Note written in EPIC  PLAN OF CARE: Admit to inpatient   PATIENT DISPOSITION:  PACU - hemodynamically stable.   Delay start of Pharmacological VTE agent (>24hrs) due to surgical blood loss or risk of bleeding: yes INDICATIONS: The patient is a 47y.o. with the aforementioned diagnoses who desires definitive surgical management. She has a history of a total abdominal hysterectomy and also a history of surgical management of an ectopic pregnancy. She is unsure of whether she has previously had an ovary or ovaries removed. On the preoperative visit, the risks, benefits, indications, and alternatives of the procedure were reviewed with the patient. On the day of surgery, the risks of surgery were again discussed with the patient including but not limited to: bleeding which may require transfusion or reoperation; infection which may require antibiotics;I reviewed in detail with the patient and her brother the increased risk of injury to the bladder, bowel, ureters or other surrounding organs due to potential scar tissue from prior surgeries and the possible need for additional procedures if injury did occur; thromboembolic phenomenon, incisional problems and  other postoperative/anesthesia complications. I reviewed again with the patient and her brother that if at the time of looking with the laparoscope, there was deemed to be too much scat tissue, making laparoscopic surgery unsafe, that we would proceed with laparotomy.  Pt expressed understanding.  All questions were answered.  The left side of the patients abdomen was marked and signed.   Written informed consent was obtained.   OPERATIVE FINDINGS: left adnexal fullness; prior hysterectomy; ovaries NOT identified on either side possibly within scar tissue or previously surgically removed.   COMPLICATIONS: none.  DESCRIPTION OF PROCEDURE: The patient received intravenous antibiotics and had sequential compression devices applied to her lower extremities while in the preoperative area. She was taken to the operating room and she was placed in a dorsal lithotomy position while awake due to her recent prior knee surgery on the left. She was placed under general anesthesia without difficulty. The abdomen and perineum were prepped and draped in a sterile manner.  A Foley catheter was inserted into the bladder and attached to constant drainage. A sponge stick was placed in the vagina. After an adequate timeout was performed, attention was then turned to the patient's abdomen where a 5-mm skin incision was made above the umbilicus.  The Veress needle was carefully introduced into the peritoneal cavity at a 45-degree angle into the abdominal wall.  Intraperitoneal placement was confirmed by drop in intraabdominal pressure with insufflation of carbon dioxide gas.  Adequate pneumoperitoneum was obtained, and the 5-mm trocar and sleeve were then advanced without difficulty into the abdomen where intraabdominal placement was  confirmed by the laparoscope. A survey of the patient's pelvis revealed dense pelvic and bowel adhesions that made it impossible to visualize the mass on the left or the adnexa on either side.  A  decision was made to convert to a laparotomy.    A transverse skin incision was made. This incision was taken down to the fascia using a scalpel and cautery with care given to maintain good hemostasis. The fascia was grasped with kocher clamps, tented up and the rectus muscles were dissected off using sharp and blunt dissection on the superior and inferior aspects of the fascial incision. The peritoneum entered sharply without complication. The pneumoperitoneum was maintain to guide with entry into the peritoneum due to the presence of scar tissue. This peritoneal cavity was entered digitally. Attention was then turned to the pelvis.  On O'Conner-O' Sullivan retractor was placed in the pelvis and lap sponges were used to pack the bowel out of the way. The mass on the left side was palpated and using sharp dissection the bowel and adhesions were dissected off of the mass. The mass was approximately 8x4cm and was ruptured in removal revealing a straw colored fluid.  The cyst wall was removed in its entirety.  Excellent hemostasis was noted.  An attempt was made to identify the ovaries on both side and neither was identified.  It is possible that the ovaries was contained within the scar tissue but, this was not noted. The pelvis was irrigated and hemostasis was reconfirmed. All laparotomy sponges and instruments were removed from the abdomen. Interceed was placed over the left adnexa.  The peritoneum was reapproximated with 0 vicryl.  The fascia was closed #1 Loop PDS. The subcutaneous fascia was closed with 3-0 vicryl and the skin was a subcuticular suture of 4-0 vicryl. The incision was injected with 30cc of 0.25% Marcaine. Benzoin and steristrips were applied.  A steri-strip was placed over the umbilical incision.  Sponge, lap and needle counts were correct times two. The patient was taken to the recovery area awake, extubated and in stable condition.

## 2013-04-21 NOTE — Anesthesia Preprocedure Evaluation (Addendum)
Anesthesia Evaluation  Patient identified by MRN, date of birth, ID band Patient awake    Reviewed: Allergy & Precautions, H&P , NPO status , Patient's Chart, lab work & pertinent test results, reviewed documented beta blocker date and time   History of Anesthesia Complications Negative for: history of anesthetic complications  Airway Mallampati: I TM Distance: >3 FB Neck ROM: full    Dental  (+) Missing   Pulmonary  Allergic rhinitis breath sounds clear to auscultation  Pulmonary exam normal       Cardiovascular Exercise Tolerance: Good hypertension, - Valvular Problems/Murmurs (told in HS she had a heart murmur - never worked up)Rhythm:regular Rate:Normal     Neuro/Psych negative neurological ROS  negative psych ROS   GI/Hepatic Neg liver ROS, GERD- (no meds recently)  ,  Endo/Other  diabetes, Type 2, Oral Hypoglycemic AgentsMorbid obesity  Renal/GU negative Renal ROS  Female GU complaint (ovarian cyst)     Musculoskeletal  (+) Arthritis - (s/p left TKA),   Abdominal Normal abdominal exam  (+)   Peds  Hematology negative hematology ROS (+)   Anesthesia Other Findings   Reproductive/Obstetrics negative OB ROS                          Anesthesia Physical Anesthesia Plan  ASA: III  Anesthesia Plan: General ETT   Post-op Pain Management:    Induction:   Airway Management Planned:   Additional Equipment:   Intra-op Plan:   Post-operative Plan:   Informed Consent: I have reviewed the patients History and Physical, chart, labs and discussed the procedure including the risks, benefits and alternatives for the proposed anesthesia with the patient or authorized representative who has indicated his/her understanding and acceptance.   Dental Advisory Given  Plan Discussed with: CRNA and Surgeon  Anesthesia Plan Comments:         Anesthesia Quick Evaluation

## 2013-04-21 NOTE — Progress Notes (Signed)
UR completed 

## 2013-04-21 NOTE — Brief Op Note (Signed)
04/21/2013  10:43 AM  PATIENT:  Cynthia Wilson  47 y.o. female  PRE-OPERATIVE DIAGNOSIS:  Left 9 cm ovarian cyst - cpt 16109   POST-OPERATIVE DIAGNOSIS:  Left 9 cm ovarian cyst - cpt 60454; extensive pelvic adhesions    PROCEDURE: Diagnostic laparoscopy with conversion to laparotomy with left adnexal cystectomy; lysis of adhesions  SURGEON:  Surgeon(s) and Role:    * Willodean Rosenthal, MD - Primary    * Allie Bossier, MD - Assisting  ANESTHESIA:   general  EBL:  Total I/O In: 500 [I.V.:500] Out: 150 [Urine:100; Blood:50]  BLOOD ADMINISTERED:none  DRAINS: none   LOCAL MEDICATIONS USED:  MARCAINE     SPECIMEN: cyst wall  DISPOSITION OF SPECIMEN:  PATHOLOGY  COUNTS:  YES  TOURNIQUET:  * No tourniquets in log *  DICTATION: .Note written in EPIC  PLAN OF CARE: Admit to inpatient   PATIENT DISPOSITION:  PACU - hemodynamically stable.   Delay start of Pharmacological VTE agent (>24hrs) due to surgical blood loss or risk of bleeding: yes

## 2013-04-21 NOTE — Anesthesia Postprocedure Evaluation (Signed)
Anesthesia Post Note  Patient: Cynthia Wilson  Procedure(s) Performed: Procedure(s) (LRB): SALPINGO OOPHORECTOMY (Left) LAPAROSCOPY DIAGNOSTIC (N/A) LAPAROTOMY (N/A)  Anesthesia type: General  Patient location: [facility]  Post pain: Pain level controlled  Post assessment: Post-op Vital signs reviewed  Last Vitals:  Filed Vitals:   04/21/13 1500  BP: 128/69  Pulse: 97  Temp: 36.5 C  Resp: 20    Post vital signs: Reviewed  Level of consciousness: sedated  Complications: No apparent anesthesia complications

## 2013-04-21 NOTE — Transfer of Care (Signed)
Immediate Anesthesia Transfer of Care Note  Patient: Cynthia Wilson  Procedure(s) Performed: Procedure(s): SALPINGO OOPHORECTOMY (Left) LAPAROSCOPY DIAGNOSTIC (N/A) LAPAROTOMY (N/A)  Patient Location: PACU  Anesthesia Type:General  Level of Consciousness: awake, sedated and patient cooperative  Airway & Oxygen Therapy: Patient Spontanous Breathing and Patient connected to nasal cannula oxygen  Post-op Assessment: Report given to PACU RN and Post -op Vital signs reviewed and stable  Post vital signs: Reviewed and stable  Complications: No apparent anesthesia complications

## 2013-04-21 NOTE — Anesthesia Postprocedure Evaluation (Signed)
  Anesthesia Post-op Note  Anesthesia Post Note  Patient: Cynthia Wilson  Procedure(s) Performed: Procedure(s) (LRB): SALPINGO OOPHORECTOMY (Left) LAPAROSCOPY DIAGNOSTIC (N/A) LAPAROTOMY (N/A)  Anesthesia type: General  Patient location: PACU  Post pain: Pain level controlled  Post assessment: Post-op Vital signs reviewed  Last Vitals:  Filed Vitals:   04/21/13 1200  BP: 127/75  Pulse: 69  Temp: 36.8 C  Resp: 14    Post vital signs: Reviewed  Level of consciousness: sedated  Complications: No apparent anesthesia complications

## 2013-04-21 NOTE — H&P (Signed)
Patient ID: Cynthia Wilson, female DOB: Oct 18, 1965, 47 y.o. MRN: [id number]  HPI Pt reports that she has been having severe pain in her pelvis. The pain is improved since she received pain meds from Dr. Reola Calkins. She reports that the pain feels like she is about to have a baby. She had similar sx in 2008 when she was diagnosed with an ovarian cyst. She reports that she thought that she may have had surgery at that time but, is not sure. She denies N/V and she denies weight loss or any other constitutional sx. She reports that the pain is intermittent. She reports occ hot flushes already. She is s/p a hyst with right opporectomy.  Past Medical History   Diagnosis  Date   .  Hypertension    .  Diabetes mellitus without complication      prediabetes   .  Heart murmur    .  GERD (gastroesophageal reflux disease)    .  Arthritis     Past Surgical History   Procedure  Laterality  Date   .  Abdominal hysterectomy   2007     partial, still has her ovaries   .  Knee arthroscopy  Left  2011   .  Hernia repair   83     umbilical   .  Total knee arthroplasty  Left  02/11/2013     Procedure: LEFT TOTAL KNEE ARTHROPLASTY; Surgeon: Harvie Junior, MD; Location: [facility] OR; Service: Orthopedics; Laterality: Left; NO COMPUTER    Current Outpatient Prescriptions on File Prior to Visit   Medication  Sig  Dispense  Refill   .  aspirin EC 325 MG tablet  Take 1 tablet (325 mg total) by mouth 2 (two) times daily after a meal.  60 tablet  0   .  fluticasone (FLONASE) 50 MCG/ACT nasal spray  Place 1 spray into the nose daily.  16 g  11   .  hydrochlorothiazide (HYDRODIURIL) 25 MG tablet  TAKE 1 TABLET (25 MG TOTAL) BY MOUTH DAILY.  30 tablet  4   .  ibuprofen (ADVIL,MOTRIN) 600 MG tablet  Take 1 tablet (600 mg total) by mouth every 8 (eight) hours as needed for pain.  30 tablet  0   .  loratadine (CLARITIN) 10 MG tablet  Take 10 mg by mouth daily.     .  metFORMIN (GLUCOPHAGE-XR) 500 MG 24 hr tablet  Take 500 mg by mouth  daily with breakfast.     .  Multiple Vitamin (MULTIVITAMIN) capsule  Take 1 capsule by mouth daily.     [name]  omeprazole (PRILOSEC) 20 MG capsule  Take 1 capsule (20 mg total) by mouth daily. for heartburn  30 capsule  11   .  methocarbamol (ROBAXIN-750) 750 MG tablet  One every 8 hrs prn spasm.  50 tablet  0   .  oxyCODONE-acetaminophen (PERCOCET/ROXICET) 5-325 MG per tablet  Take 1-2 tablets by mouth every 6 (six) hours as needed for pain.  60 tablet  0    No current facility-administered medications on file prior to visit.    History    Social History   .  Marital Status:  Married     Spouse Name:  N/A     Number of Children:  N/A   .  Years of Education:  N/A    Occupational History   .  Not on file.    Social History Main Topics   .  Smoking status:  Never Smoker   .  Smokeless tobacco:  Not on file   .  Alcohol Use:  No   .  Drug Use:  No   .  Sexual Activity:  Yes     Birth Control/ Protection:  Surgical    Other Topics  Concern   .  Not on file    Social History Narrative    Lives with husband and 56 year old son. Works as a Scientist, physiological.    Family History   Problem  Relation  Age of Onset   .  Diabetes  Mother    .  Hypertension  Mother    .  Hyperlipidemia  Mother    .  Emphysema  Father    .  Cancer  Neg Hx    .  Stroke  Neg Hx    .  Heart disease  Neg Hx     History    Social History   .  Marital Status:  Married     Spouse Name:  N/A     Number of Children:  N/A   .  Years of Education:  N/A    Occupational History   .  Not on file.    Social History Main Topics   .  Smoking status:  Never Smoker   .  Smokeless tobacco:  Not on file   .  Alcohol Use:  No   .  Drug Use:  No   .  Sexual Activity:  Yes     Birth Control/ Protection:  Surgical    Other Topics  Concern   .  Not on file    Social History Narrative    Lives with husband and 31 year old son. Works as a Scientist, physiological.   Review of Systems  Objective:   Physical Exam: BP 133/91   Pulse 87  Temp(Src) 98.2 F (36.8 C) (Oral)  Resp 16  SpO2 98%  Pt in NAD  Lungs: CTA  CV: RRR  Abd: obese, NT, ND  GU:  EGBUS: no lesions  Vagina: no blood in vault  Cervix/Uterus surgically absent  Adnexa: mass on left side extending to midline  03/03/2007  Clinical Data: 47 year old with left ovarian cyst.  TRANSABDOMINAL AND TRANSVAGINAL PELVIC ULTRASOUND:  Technique: Both transabdominal and transvaginal ultrasound examinations of the pelvis were performed including evaluation of the uterus, ovaries, adnexal regions, and pelvic cul-de-sac.  Findings: The patient is status-post hysterectomy. The right ovary is normal in size and appearance.  There is a predominately anechoic (fluid-filled) somewhat tubular appearing left adnexal mass measuring 8.0 x 7.2 x 4.3 cm. There are some associated echoes within this mass which may represent soft tissue components/debris, and there is a small amount of Doppler flow identified in the mass. It is difficult to identify definite ovarian tissue separate from this mass; however there is a 4.8 x 4.0 x 3.9 cm soft tissue density adjacent to the more fluid-filled mass which may represent an ovary. No free fluid is identified in the pelvis.  IMPRESSION:  1. Large (8.0 x 7.2 x 4.3 cm) left adnexal mass which is somewhat tubular in appearance and contains both fluid and a small amount of soft tissue components. This may represent a hydrosalpinx or tuboovarian abscess. A less likely consideration is an ovarian neoplasm. Gynecologic consultation is recommended.  03/28/2013  EXAM:  TRANSABDOMINAL AND TRANSVAGINAL ULTRASOUND OF PELVIS  TECHNIQUE:  Both transabdominal and transvaginal ultrasound examinations of the  pelvis were performed. Transabdominal technique  was performed for  global imaging of the pelvis including uterus, ovaries, adnexal  regions, and pelvic cul-de-sac. It was necessary to proceed with  endovaginal exam following the transabdominal exam  to visualize the  left ovary.  COMPARISON: Ultrasound dated 03/02/2017 and CT scan abdomen dated  02/24/2007.  FINDINGS:  Uterus  Removed.  Right ovary  Removed.  Left ovary  There is a large cystic lesion in the left adnexum measuring 9.0 x  8.1 x 7.6 cm with an adjacent 3.8 x 3.3 x 1.7 cm cyst extending to  the right.  Other findings  No free fluid.  IMPRESSION:  Cystic lesions in the pelvis, probably arising from the left ovary.  These cysts were seen previously on 03/03/2007. The larger one has  increased from a 8.0 x 7.2 x 4.3 cm to 9.0 x 8.1 x 7.6 cm. The  smaller one has diminished in size from 4.8 x 4.0 x 3.9 cm to 3.8 x  3.3 x 1.7 cm. Considering the minimal changes, these are most likely  benign.  Assessment:   ovarian cyst- noted since 2008 with only minor change in size. Suspect benign in nature. Appears unilocular on sono will attempt to per procedure laparoscopically. Pt wants surgical eval. Reviewed risks and alternatives with pt. She recently had knee replacement and is not supposed to bend her knee.  Plan:     Patient desires surgical management with laparoscopy with left salpingoophorectomy and possible laparotomy. The risks of surgery were discussed in detail with the patient including but not limited to: bleeding which may require transfusion or reoperation; infection which may require prolonged hospitalization or re-hospitalization and antibiotic therapy; injury to bowel, bladder, ureters and major vessels or other surrounding organs; need for additional procedures including laparotomy; thromboembolic phenomenon, incisional problems and other postoperative or anesthesia complications. Patient was told that the likelihood that her condition and symptoms will be treated effectively with this surgical management was very high; the postoperative expectations were also discussed in detail. The patient also understands the alternative treatment options which were discussed in  full. All questions were answered.

## 2013-04-22 ENCOUNTER — Encounter (HOSPITAL_COMMUNITY): Payer: Self-pay | Admitting: Obstetrics & Gynecology

## 2013-04-22 ENCOUNTER — Encounter: Payer: Self-pay | Admitting: *Deleted

## 2013-04-22 LAB — BASIC METABOLIC PANEL WITH GFR
BUN: 11 mg/dL (ref 6–23)
CO2: 30 meq/L (ref 19–32)
Calcium: 8.4 mg/dL (ref 8.4–10.5)
Chloride: 104 meq/L (ref 96–112)
Creatinine, Ser: 0.76 mg/dL (ref 0.50–1.10)
GFR calc Af Amer: >90 mL/min
GFR calc non Af Amer: >90 mL/min
Glucose, Bld: 141 mg/dL — ABNORMAL HIGH (ref 70–99)
Potassium: 3.8 meq/L (ref 3.5–5.1)
Sodium: 140 meq/L (ref 135–145)

## 2013-04-22 LAB — CBC
Hemoglobin: 9.7 g/dL — ABNORMAL LOW (ref 12.0–15.0)
MCH: 24.4 pg — ABNORMAL LOW (ref 26.0–34.0)
MCHC: 31.6 g/dL (ref 30.0–36.0)
MCV: 77.1 fL — ABNORMAL LOW (ref 78.0–100.0)
RBC: 3.98 MIL/uL (ref 3.87–5.11)

## 2013-04-22 MED ORDER — IBUPROFEN 800 MG PO TABS: 800.0000 mg | ORAL_TABLET | Freq: Three times a day (TID) | ORAL | Status: DC | PRN

## 2013-04-22 MED ORDER — OXYCODONE-ACETAMINOPHEN 5-325 MG PO TABS: 1.0000 | ORAL_TABLET | ORAL | Status: DC | PRN

## 2013-04-22 NOTE — Discharge Summary (Signed)
Physician Discharge Summary  Patient ID: Cynthia Wilson MRN: [id number] DOB/AGE: 1965-12-23 47 y.o.  Admit date: 04/21/2013 Discharge date: 04/22/2013  Admission Diagnoses: left adnexal mass  Discharge Diagnoses:  Active Problems:   Adnexal mass   Pelvic peritoneal adhesions, female   Discharged Condition: good  Hospital Course: Patient was admitted yesterday for laparoscopy for left adnexal mass.  The procedure was converted to laparotomy.  She had good pain control overnight and reports that she only pushed her PCA twice.  She denies N/V and reports that she tolerated a regular diet last night without difficulty.  She is not passing flatus.  No BM.  She has not voided and foley is still in place.  She requested discharge as soon as she can go home.       Consults: None  Significant Diagnostic Studies: labs: CBC, BMP  Treatments: IV hydration, analgesia: Morphine and Percocet and surgery: Exp Laparotomy  Discharge Exam: Blood pressure 126/81, pulse 69, temperature 97.7 F (36.5 C), temperature source Oral, resp. rate 15, height 5\' 6"  (1.676 m), weight 250 lb (113.399 kg), SpO2 100.00%. General appearance: alert and no distress Resp: clear to auscultation bilaterally Cardio: regular rate and rhythm, S1, S2 normal, no murmur, click, rub or gallop Extremities: extremities normal, atraumatic, no cyanosis or edema Incision/Wound: dressing clean and dry  CBC    Component Value Date/Time   WBC 10.8* 04/22/2013 0532   RBC 3.98 04/22/2013 0532   HGB 9.7* 04/22/2013 0532   HCT 30.7* 04/22/2013 0532   PLT 309 04/22/2013 0532   MCV 77.1* 04/22/2013 0532   MCH 24.4* 04/22/2013 0532   MCHC 31.6 04/22/2013 0532   RDW 14.7 04/22/2013 0532   LYMPHSABS 2.1 02/01/2013 1123   MONOABS 0.5 02/01/2013 1123   EOSABS 0.1 02/01/2013 1123   BASOSABS 0.0 02/01/2013 1123    Surg path: pending  Disposition: 06-Home-Health Care Svc  Discharge Orders   Future Orders Complete By Expires   Call MD  for:  difficulty breathing, headache or visual disturbances  As directed    Call MD for:  hives  As directed    Call MD for:  persistant nausea and vomiting  As directed    Call MD for:  redness, tenderness, or signs of infection (pain, swelling, redness, odor or green/yellow discharge around incision site)  As directed    Call MD for:  severe uncontrolled pain  As directed    Call MD for:  temperature >100.4  As directed    Diet - low sodium heart healthy  As directed    Diet Carb Modified  As directed    Driving Restrictions  As directed    Comments:     No driving while on pain medications   Increase activity slowly  As directed    Lifting restrictions  As directed    Comments:     No lifting greater than 20# for 2 weeks   Other Restrictions  As directed    Comments:     May return to work in 2 weeks   Remove dressing in 24 hours  As directed    Sexual Activity Restrictions  As directed    Comments:     No intercourse for 4 weeks       Medication List         fluticasone 50 MCG/ACT nasal spray  Commonly known as:  FLONASE  Place 1 spray into the nose daily as needed for allergies.     hydrochlorothiazide  25 MG tablet  Commonly known as:  HYDRODIURIL  TAKE 1 TABLET (25 MG TOTAL) BY MOUTH DAILY.     ibuprofen 800 MG tablet  Commonly known as:  ADVIL,MOTRIN  Take 1 tablet (800 mg total) by mouth every 8 (eight) hours as needed (mild pain).     loratadine 10 MG tablet  Commonly known as:  CLARITIN  Take 10 mg by mouth daily as needed for allergies.     metFORMIN 500 MG 24 hr tablet  Commonly known as:  GLUCOPHAGE-XR  Take 500 mg by mouth daily with breakfast.     methocarbamol 750 MG tablet  Commonly known as:  ROBAXIN-750  One every 8 hrs prn spasm.     multivitamin capsule  Take 1 capsule by mouth daily.     oxyCODONE-acetaminophen 5-325 MG per tablet  Commonly known as:  PERCOCET/ROXICET  Take 1-2 tablets by mouth every 4 (four) hours as needed.            Follow-up Information   Follow up with Willodean Rosenthal, MD In 2 weeks.   Specialty:  Obstetrics and Gynecology   Contact information:   [address] [place] [address] [phone]     for discharge after dinner if she meets discharge criteria  Signed: HARRAWAY-SMITH, Cobey Raineri 04/22/2013, 8:33 AM

## 2013-04-24 ENCOUNTER — Encounter (HOSPITAL_COMMUNITY): Payer: Self-pay | Admitting: *Deleted

## 2013-04-24 ENCOUNTER — Inpatient Hospital Stay (HOSPITAL_COMMUNITY)
Admission: AD | Admit: 2013-04-24 | Discharge: 2013-04-24 | Disposition: A | Discharge status: Home / Self Care | Payer: Managed Care, Other (non HMO) | Source: Ambulatory Visit | Attending: Obstetrics & Gynecology | Admitting: Obstetrics & Gynecology

## 2013-04-24 DIAGNOSIS — I10 Essential (primary) hypertension: Secondary | ICD-10-CM | POA: Insufficient documentation

## 2013-04-24 DIAGNOSIS — E119 Type 2 diabetes mellitus without complications: Secondary | ICD-10-CM | POA: Insufficient documentation

## 2013-04-24 DIAGNOSIS — T888XXA Other specified complications of surgical and medical care, not elsewhere classified, initial encounter: Secondary | ICD-10-CM

## 2013-04-24 DIAGNOSIS — R1084 Generalized abdominal pain: Secondary | ICD-10-CM

## 2013-04-24 DIAGNOSIS — K219 Gastro-esophageal reflux disease without esophagitis: Secondary | ICD-10-CM | POA: Insufficient documentation

## 2013-04-24 DIAGNOSIS — IMO0002 Reserved for concepts with insufficient information to code with codable children: Secondary | ICD-10-CM | POA: Insufficient documentation

## 2013-04-24 NOTE — MAU Provider Note (Signed)
History     CSN: [id number]  Arrival date and time: 04/24/13 1117   First Provider Initiated Contact with Patient 04/24/13 1200      Chief Complaint  Patient presents with  . Post-op Problem   HPI Ms. Cynthia Wilson is a 47 y.o. [phone] who presents to MAU today 3 days s/p laparotomy with left adnexal cystecomy with complaint of bleeding from her incision site. The patient states that she feels wet in the area surrounding the incision which is under her panus. She will pat the area with gauze and has noted some pink. She is concerned about possible infection. She denies fever. Pain is well controlled.   OB History   Grav Para Term Preterm Abortions TAB SAB Ect Mult Living   6 4 4  2  2   4       Past Medical History  Diagnosis Date  . Hypertension   . Diabetes mellitus without complication     prediabetes  . Heart murmur   . GERD (gastroesophageal reflux disease)   . Arthritis     Past Surgical History  Procedure Laterality Date  . Abdominal hysterectomy  2007    partial, still has her ovaries  . Knee arthroscopy Left 2011  . Hernia repair  83    umbilical  . Total knee arthroplasty Left 02/11/2013    Procedure: LEFT TOTAL KNEE ARTHROPLASTY;  Surgeon: Harvie Junior, MD;  Location: [facility];  Service: Orthopedics;  Laterality: Left;  NO COMPUTER  . Salpingoophorectomy Left 04/21/2013    Procedure: SALPINGO OOPHORECTOMY;  Surgeon: Willodean Rosenthal, MD;  Location: [facility];  Service: Gynecology;  Laterality: Left;  . Laparoscopy N/A 04/21/2013    Procedure: LAPAROSCOPY DIAGNOSTIC;  Surgeon: Willodean Rosenthal, MD;  Location: [facility];  Service: Gynecology;  Laterality: N/A;  . Laparotomy N/A 04/21/2013    Procedure: LAPAROTOMY;  Surgeon: Willodean Rosenthal, MD;  Location: [facility];  Service: Gynecology;  Laterality: N/A;    Family History  Problem Relation Age of Onset  . Diabetes Mother   . Hypertension Mother   . Hyperlipidemia Mother   . Emphysema Father     . Cancer Neg Hx   . Stroke Neg Hx   . Heart disease Neg Hx     History  Substance Use Topics  . Smoking status: Never Smoker   . Smokeless tobacco: Not on file  . Alcohol Use: No    Allergies:  Allergies  Allergen Reactions  . Iohexol      Code: HIVES, Desc: Pt called on 9/2/8 and stated she had a 2 day delayed reaction from contrast.  She broke out in hives and a rash all over her body.  She went to urgent care and received a steroid pack. Check w/ rad for premedication before any IV contrast., Onset Date: [id number]   . Other Rash    Wrap used when gave blood    Prescriptions prior to admission  Medication Sig Dispense Refill  . hydrochlorothiazide (HYDRODIURIL) 25 MG tablet TAKE 1 TABLET (25 MG TOTAL) BY MOUTH DAILY.  30 tablet  4  . ibuprofen (ADVIL,MOTRIN) 800 MG tablet Take 1 tablet (800 mg total) by mouth every 8 (eight) hours as needed (mild pain).  30 tablet  0  . loratadine (CLARITIN) 10 MG tablet Take 10 mg by mouth daily as needed for allergies.       . metFORMIN (GLUCOPHAGE-XR) 500 MG 24 hr tablet Take 500 mg by mouth daily with breakfast.      .  methocarbamol (ROBAXIN-750) 750 MG tablet One every 8 hrs prn spasm.  50 tablet  0  . Multiple Vitamin (MULTIVITAMIN) capsule Take 1 capsule by mouth daily.        [name] oxyCODONE-acetaminophen (PERCOCET/ROXICET) 5-325 MG per tablet Take 1-2 tablets by mouth every 4 (four) hours as needed.  30 tablet  0    Review of Systems  Constitutional: Negative for fever and malaise/fatigue.  Gastrointestinal: Positive for abdominal pain.   Physical Exam   Blood pressure 139/81, pulse 84, temperature 98.5 F (36.9 C), temperature source Oral, resp. rate 18.  Physical Exam  Constitutional: She is oriented to person, place, and time. She appears well-developed and well-nourished. No distress.  HENT:  Head: Normocephalic and atraumatic.  Cardiovascular: Normal rate.   Respiratory: Effort normal.  GI: Soft. She exhibits no  distension. There is tenderness (mild to moderate tenderness to palpation of the area surrounding the surgical site).    Neurological: She is alert and oriented to person, place, and time.  Skin: Skin is warm and dry. No erythema.  Psychiatric: She has a normal mood and affect.    MAU Course  Procedures None  MDM Discussed patient with Dr. Marice Potter. She came to MAU to evaluate the patient. She agrees that the incision site is healing well without evidence of infection or bleeding at this time.  Patient is advised to keep the area clean and dry and follow-up in the clinic for her routine post-operative appointment in 2 weeks  Assessment and Plan  A: Normal healing post-operative incision  P: Discharge home Patient advised to call to schedule her routine post-operative appointment as previously instructed Patient advised to keep area clean and dry Patient may return to MAU as needed or if her condition were to change or worsen  Freddi Starr, PA-C  04/24/2013, 12:00 PM

## 2013-04-24 NOTE — MAU Note (Signed)
Had surgery on 04-21-13 and is c/o incision bleeding; denies any lifting or injury;

## 2013-05-05 ENCOUNTER — Encounter: Payer: Self-pay | Admitting: Obstetrics & Gynecology

## 2013-05-05 ENCOUNTER — Ambulatory Visit (INDEPENDENT_AMBULATORY_CARE_PROVIDER_SITE_OTHER): Payer: Managed Care, Other (non HMO) | Admitting: Obstetrics & Gynecology

## 2013-05-05 VITALS — BP 140/83 | HR 94 | Temp 98.8°F | Ht 66.0 in | Wt 260.3 lb

## 2013-05-05 DIAGNOSIS — Z9889 Other specified postprocedural states: Secondary | ICD-10-CM

## 2013-05-05 DIAGNOSIS — N951 Menopausal and female climacteric states: Secondary | ICD-10-CM

## 2013-05-05 NOTE — Patient Instructions (Signed)
Laparotomy °Care After °Refer to this sheet in the next few weeks. These instructions provide you with information on caring for yourself after your procedure. Your caregiver may also give you more specific instructions. Your treatment has been planned according to current medical practices, but problems sometimes occur. Call your caregiver if you have any problems or questions after your procedure. °HOME CARE INSTRUCTIONS °ACTIVITY °· Rest as much as possible the first two weeks at home. °· Avoid strenuous activity such as heavy lifting (more than 10 pounds), pushing, or pulling. Limit stair climbing to once or twice a day for the first week, then slowly increase this activity. °· Take frequent rest periods throughout the day. °· Talk with your caregiver about when you may resume your usual physical activity. °· You need to be out of bed and walking as much as possible. This decreases the chance of: °· Blood clots. °· Pneumonia. °NUTRITION °· You can resume your normal diet once you regain bowel function. °· Drink plenty of fluids (6 8 glasses a day or as instructed by your caregiver). °· Eat a well-balanced diet. °· Daily portions of food from the meat (protein), milk, vegetable, and bread groups are necessary for your health. °ELIMINATION °It is very important not to strain during bowel movements. If constipation should occur, you may: °· Take a mild laxative. °· Add fruit and bran to your diet. °· Drink more fluids. °HYGIENE °· Take showers, not baths, until 4 6 weeks after surgery. °· If your incision is closed, you may take a shower or tub bath. °FEVER °If you feel feverish or have shaking chills, take your temperature. If it is 102° F (38.9° C), call your caregiver. The fever may mean there is an infection. °PAIN CONTROL °· Mild discomfort may occur. °· Only take over-the-counter or prescription medicines for pain, discomfort, or fever as directed by your caregiver. Take any prescribed medicines exactly as  directed. °INCISION CARE °· Keep your incision site clean with soap and water. °· Do not use a dressing unless your cut (incision) from surgery is draining or irritated. °· If you have small adhesive strips in place and they do not fall off within 10 days, carefully peel them off. °· Check your incision and surrounding area daily for any redness, swelling, discoloration, heavy drainage, or separation of the skin. °SEXUAL INTERCOURSE °Do not have sexual intercourse until after your follow-up appointment, unless your caregiver tells you otherwise. °SEEK MEDICAL CARE IF:  °· You are unable to tolerate food or drinks. °· You are unable to pass gas or have a bowel movement. °· Your pain becomes more severe or is not relieved with medicines. °· You have redness, swelling, discoloration, heavy drainage, or separation of the skin at the incision site. °Document Released: 01/29/2004 Document Revised: 06/02/2012 Document Reviewed: 06/15/2007 °Exit[employer]® Patient Information ©2014 [employer]. ° °

## 2013-05-05 NOTE — Progress Notes (Signed)
Subjective:     Patient ID: Cynthia Wilson, female   DOB: [birth year], 47 y.o.   MRN: [id number]  HPI Pt reports some pain on her right side (the side opposite of her resection).  She denies N/V.  She is voiding and passing her stools without difficulty.    Review of Systems     Objective:   Physical Exam BP 140/83  Pulse 94  Temp(Src) 98.8 F (37.1 C)  Ht 5\' 6"  (1.676 m)  Wt 260 lb 4.8 oz (118.071 kg)  BMI 42.03 kg/m2 Pt in NAD Abd: incision clean/dry and intact   04/21/2013 Diagnosis Ovary, cyst, adnexal cyst wall - BENIGN SEROUS CYST.    Assessment:     2 week post op check from laparotomy.  Doing well.  Ovaries not seen intraop unsure if there is an ovary on the right side due to scar tissue.  Will obtain [facility] to see       Plan:     F/u 3 month of sooner prn May RTW on Monday Nov 10th  Lab: [facility]

## 2013-05-06 LAB — FOLLICLE STIMULATING HORMONE: FSH: 5 m[IU]/mL

## 2013-05-19 ENCOUNTER — Other Ambulatory Visit: Payer: Self-pay | Admitting: Family Medicine

## 2013-05-22 ENCOUNTER — Other Ambulatory Visit: Payer: Self-pay | Admitting: Family Medicine

## 2013-08-03 ENCOUNTER — Ambulatory Visit (INDEPENDENT_AMBULATORY_CARE_PROVIDER_SITE_OTHER): Payer: Managed Care, Other (non HMO) | Admitting: Obstetrics & Gynecology

## 2013-08-03 ENCOUNTER — Encounter: Payer: Self-pay | Admitting: Obstetrics & Gynecology

## 2013-08-03 VITALS — BP 145/94 | HR 89 | Temp 98.6°F | Wt 257.2 lb

## 2013-08-03 DIAGNOSIS — R102 Pelvic and perineal pain: Secondary | ICD-10-CM

## 2013-08-03 DIAGNOSIS — N949 Unspecified condition associated with female genital organs and menstrual cycle: Secondary | ICD-10-CM

## 2013-08-03 MED ORDER — IBUPROFEN 800 MG PO TABS: 800.0000 mg | ORAL_TABLET | Freq: Three times a day (TID) | ORAL | Status: DC | PRN

## 2013-08-03 NOTE — Patient Instructions (Signed)
Menopause Menopause is the normal time of life when menstrual periods stop completely. Menopause is complete when you have missed 12 consecutive menstrual periods. It usually occurs between the ages of 8 years and 48 years. Very rarely does a woman develop menopause before the age of 60 years. At menopause, your ovaries stop producing the female hormones estrogen and progesterone. This can cause undesirable symptoms and also affect your health. Sometimes the symptoms may occur 4 5 years before the menopause begins. There is no relationship between menopause and:  Oral contraceptives.  Number of children you had.  Race.  The age your menstrual periods started (menarche). Heavy smokers and very thin women may develop menopause earlier in life. CAUSES  The ovaries stop producing the female hormones estrogen and progesterone.  Other causes include:  Surgery to remove both ovaries.  The ovaries stop functioning for no known reason.  Tumors of the pituitary gland in the brain.  Medical disease that affects the ovaries and hormone production.  Radiation treatment to the abdomen or pelvis.  Chemotherapy that affects the ovaries. SYMPTOMS   Hot flashes.  Night sweats.  Decrease in sex drive.  Vaginal dryness and thinning of the vagina causing painful intercourse.  Dryness of the skin and developing wrinkles.  Headaches.  Tiredness.  Irritability.  Memory problems.  Weight gain.  Bladder infections.  Hair growth of the face and chest.  Infertility. More serious symptoms include:  Loss of bone (osteoporosis) causing breaks (fractures).  Depression.  Hardening and narrowing of the arteries (atherosclerosis) causing heart attacks and strokes. DIAGNOSIS   When the menstrual periods have stopped for 12 straight months.  Physical exam.  Hormone studies of the blood. TREATMENT  There are many treatment choices and nearly as many questions about them. The  decisions to treat or not to treat menopausal changes is an individual choice made with your health care provider. Your health care provider can discuss the treatments with you. Together, you can decide which treatment will work best for you. Your treatment choices may include:   Hormone therapy (estrogen and progesterone).  Non-hormonal medicines.  Treating the individual symptoms with medicine (for example antidepressants for depression).  Herbal medicines that may help specific symptoms.  Counseling by a psychiatrist or psychologist.  Group therapy.  Lifestyle changes including:  Eating healthy.  Regular exercise.  Limiting caffeine and alcohol.  Stress management and meditation.  No treatment. HOME CARE INSTRUCTIONS   Take the medicine your health care provider gives you as directed.  Get plenty of sleep and rest.  Exercise regularly.  Eat a diet that contains calcium (good for the bones) and soy products (acts like estrogen hormone).  Avoid alcoholic beverages.  Do not smoke.  If you have hot flashes, dress in layers.  Take supplements, calcium, and vitamin D to strengthen bones.  You can use over-the-counter lubricants or moisturizers for vaginal dryness.  Group therapy is sometimes very helpful.  Acupuncture may be helpful in some cases. SEEK MEDICAL CARE IF:   You are not sure you are in menopause.  You are having menopausal symptoms and need advice and treatment.  You are still having menstrual periods after age 69 years.  You have pain with intercourse.  Menopause is complete (no menstrual period for 12 months) and you develop vaginal bleeding.  You need a referral to a specialist (gynecologist, psychiatrist, or psychologist) for treatment. SEEK IMMEDIATE MEDICAL CARE IF:   You have severe depression.  You have excessive vaginal bleeding.  You fell and think you have a broken bone.  You have pain when you urinate.  You develop leg or  chest pain.  You have a fast pounding heart beat (palpitations).  You have severe headaches.  You develop vision problems.  You feel a lump in your breast.  You have abdominal pain or severe indigestion. Document Released: 09/06/2003 Document Revised: 02/16/2013 Document Reviewed: 01/13/2013 [facility] Patient Information 2014 [place], [place].

## 2013-08-05 ENCOUNTER — Encounter: Payer: Self-pay | Admitting: Obstetrics & Gynecology

## 2013-08-05 NOTE — Progress Notes (Signed)
Subjective:     Patient ID: Cynthia Wilson, female   DOB: 10/11/1965, 48 y.o.   MRN: [id number]  HPI Pt present for 3 months post op check.  She is without complaints. She reports an occ pain in her side but, othewise is reports no pain.  She does report that she has vasomother signs occ an dmood changes.   Review of Systems     Objective:   Physical Exam BP 145/94  Pulse 89  Temp(Src) 98.6 F (37 C) (Oral)  Wt 257 lb 3.2 oz (116.665 kg) Pt in NAD Abd: obese, NT, ND GYN exam deferred      Assessment:     3 month post op check     Plan:     Reviewed menopausal s/sx

## 2013-08-29 ENCOUNTER — Encounter: Payer: Self-pay | Admitting: Family Medicine

## 2013-08-29 ENCOUNTER — Ambulatory Visit (INDEPENDENT_AMBULATORY_CARE_PROVIDER_SITE_OTHER): Payer: Managed Care, Other (non HMO) | Admitting: Family Medicine

## 2013-08-29 VITALS — BP 157/104 | HR 106 | Temp 99.4°F | Ht 66.0 in | Wt 252.0 lb

## 2013-08-29 DIAGNOSIS — J069 Acute upper respiratory infection, unspecified: Secondary | ICD-10-CM

## 2013-08-29 NOTE — Progress Notes (Signed)
  Garret Reddish, MD Phone: [phone]  Subjective:  Chief complaint-noted  Upper Respiratory Infection Patient complains of symptoms of a URI. Symptoms include right ear pressure/pain, congestion, coryza, facial pain, low grade fever, non productive cough, sinus pressure, sneezing and sore throat. Onset of symptoms was 7 days ago, and has been gradually worsening since that time but has started to improve today. Treatment to date: antihistamines, cough suppressants and decongestants. Having a hard time sleeping due to right ear pressure and right headache. States she started decongestant yesterday due to wanting to clear things out. Blood pressure elevated today as a result. States pain is mildly improved after using.  ROS- no fever/chills. Slight decreased appetite. No shortness of breath or chest pain.  Past Medical History- diabetes mellitus, hypertension, obseity, allergic rhinitis Medications- reviewed and updated Current Outpatient Prescriptions  Medication Sig Dispense Refill  . hydrochlorothiazide (HYDRODIURIL) 25 MG tablet TAKE 1 TABLET (25 MG TOTAL) BY MOUTH DAILY.  30 tablet  4  . ibuprofen (ADVIL,MOTRIN) 800 MG tablet Take 1 tablet (800 mg total) by mouth every 8 (eight) hours as needed (mild pain).  30 tablet  3  . loratadine (CLARITIN) 10 MG tablet Take 10 mg by mouth daily as needed for allergies.       . metFORMIN (GLUCOPHAGE-XR) 500 MG 24 hr tablet TAKE 1 TABLET (500 MG TOTAL) BY MOUTH DAILY WITH BREAKFAST.  30 tablet  5  . Multiple Vitamin (MULTIVITAMIN) capsule Take 1 capsule by mouth daily.        [name] omeprazole (PRILOSEC) 20 MG capsule TAKE 1 CAPSULE (20 MG TOTAL) BY MOUTH DAILY. FOR HEARTBURN  30 capsule  4   No current facility-administered medications for this visit.    Objective: BP 157/104  Pulse 106  Temp(Src) 99.4 F (37.4 C) (Oral)  Ht 5\' 6"  (1.676 m)  Wt 252 lb (114.306 kg)  BMI 40.69 kg/m2  SpO2 99% Gen: NAD, resting comfortably HEENT: nares  erythematous and swollen, oropharynx normal without pharyngeal exudate, TM normal bilaterally, Mucous membranes are moist. Neck: supple, no lymphadenopathy CV: RRR no murmurs rubs or gallops (do nto hear murmur previously heard) Lungs: CTAB no crackles, wheeze, rhonchi Ext: no edema Skin: warm, dry, no rash   Assessment/Plan:  Upper Respiratory Infection Advised of symptomatic care (see AVS).  Advised to avoid decongestants given blood pressure.  Doubt influenza as afebrile Given reasons for return (signs of bacterial infection/sinusitis)

## 2013-08-29 NOTE — Patient Instructions (Signed)
Upper Respiratory Infection The most important thing is to get a lot of rest and stay well hydrated. Please stay out of work tomorrow to give you time to heal.  If you develop fevers, persistent symptoms not getting better beyond the time we discussed (10 days which is Thurssday), worsening of symptoms after they get better, please schedule a follow up visit or give [place] a call for advise.   For ear ache/headache- take tylenol 650 mg every 4-6 hours for 24 hours For cough- use cough drops.  For congestion-try a neti pot and make sure to follow instructions for water Avoid decongestants as they are running your blood pressure up   Looks like you need to see Dr. Gwendlyn Deutscher to discussing the following: Health Maintenance Due  Topic Date Due  . Influenza Vaccine  01/28/2013  . Mammogram  07/20/2013  . Colonoscopy  07/20/2013

## 2013-08-31 ENCOUNTER — Other Ambulatory Visit: Payer: Self-pay | Admitting: Family Medicine

## 2013-09-01 ENCOUNTER — Telehealth: Payer: Self-pay | Admitting: Family Medicine

## 2013-09-01 NOTE — Telephone Encounter (Signed)
Please contact patient back to advise what she can take for congestion.  Was seen on Monday, but wasn't given anything for symptoms.  Please call before end of clinic today.

## 2013-09-01 NOTE — Telephone Encounter (Signed)
Called patient back.  Continues to have congestion.  Has tried [employer] for congestion and not helping.  Encouraged patient to drink plenty of fluids to help loosen secretions and call back tomorrow if symptoms are not better or worsen.  Burna Forts, BSN, RN-BC

## 2013-09-05 ENCOUNTER — Ambulatory Visit (INDEPENDENT_AMBULATORY_CARE_PROVIDER_SITE_OTHER): Payer: Managed Care, Other (non HMO) | Admitting: Family Medicine

## 2013-09-05 ENCOUNTER — Encounter: Payer: Self-pay | Admitting: Family Medicine

## 2013-09-05 VITALS — BP 119/80 | HR 102 | Temp 99.1°F | Wt 251.0 lb

## 2013-09-05 DIAGNOSIS — J329 Chronic sinusitis, unspecified: Secondary | ICD-10-CM

## 2013-09-05 MED ORDER — AMOXICILLIN 500 MG PO CAPS: 500.0000 mg | ORAL_CAPSULE | Freq: Three times a day (TID) | ORAL | Status: AC

## 2013-09-05 NOTE — Progress Notes (Signed)
   Subjective:    Patient ID: Cynthia Wilson, female    DOB: 01-22-1966, 48 y.o.   MRN: [id number]  HPI  UPPER RESPIRATORY INFECTION  Onset: More than one week  Course: started with congestion now with teeth pain and continued congestion Better with: nothing  Sick contacts: no  Nasal discharge (color,laterality): clear  Sinusitis Risk Factors Fever: no   Headache/face pain: yes  Double sickening: yes  Tooth pain: yes   Allergy Risk Factors: Sneezing: no  Itchy scratchy throat: no  Seasonal sx: yes   Flu Risk Factors Headache: yes  Muscle aches: no  Severe fatigue: no    Red Flags  Stiff neck: no  Dyspnea: no  Rash: no  Swallowing difficulty: no     Review of Symptoms - see HPI  PMH - Smoking status noted.     Review of Systems     Objective:   Physical Exam  Heart - Regular rate and rhythm.  No murmurs, gallops or rubs.    Lungs:  Normal respiratory effort, chest expands symmetrically. Lungs are clear to auscultation, no crackles or wheezes. Ears:  External ear exam shows no significant lesions or deformities.  Otoscopic examination reveals clear canals, tympanic membranes are intact bilaterally without bulging, retraction, inflammation or discharge. Hearing is grossly normal bilaterall Mouth - no lesions, mucous membranes are moist, no decaying teeth  Nose - bilateral swollen turbinates.  Transillumination decreased bilaterally      Assessment & Plan:   Sinusitis Likely given double sickiening and tooth pain.  Treat with decongestant topical and antibiotics

## 2013-09-05 NOTE — Patient Instructions (Addendum)
Good to see you today!  Thanks for coming in.  To open your nose Use Afrin or Neosynephrine Nasal spray - 2 puffs twice daily for 3 days only   If you are no better in one week or worse or have high fever or severe face pain then come back

## 2013-09-28 DIAGNOSIS — S83249A Other tear of medial meniscus, current injury, unspecified knee, initial encounter: Secondary | ICD-10-CM

## 2013-09-28 HISTORY — DX: Other tear of medial meniscus, current injury, unspecified knee, initial encounter: S83.249A

## 2013-10-17 ENCOUNTER — Other Ambulatory Visit: Payer: Self-pay | Admitting: Orthopedic Surgery

## 2013-10-17 ENCOUNTER — Encounter (HOSPITAL_BASED_OUTPATIENT_CLINIC_OR_DEPARTMENT_OTHER): Payer: Self-pay | Admitting: *Deleted

## 2013-10-17 NOTE — Pre-Procedure Instructions (Signed)
To come for BMET 

## 2013-10-18 ENCOUNTER — Other Ambulatory Visit: Payer: Self-pay

## 2013-10-18 ENCOUNTER — Encounter (HOSPITAL_BASED_OUTPATIENT_CLINIC_OR_DEPARTMENT_OTHER)
Admission: RE | Admit: 2013-10-18 | Discharge: 2013-10-18 | Disposition: A | Discharge status: Home / Self Care | Payer: Managed Care, Other (non HMO) | Source: Ambulatory Visit | Attending: Orthopedic Surgery | Admitting: Orthopedic Surgery

## 2013-10-18 DIAGNOSIS — Z1231 Encounter for screening mammogram for malignant neoplasm of breast: Secondary | ICD-10-CM

## 2013-10-18 LAB — BASIC METABOLIC PANEL
BUN: 14 mg/dL (ref 6–23)
CALCIUM: 9.4 mg/dL (ref 8.4–10.5)
CO2: 29 mEq/L (ref 19–32)
CREATININE: 0.84 mg/dL (ref 0.50–1.10)
Chloride: 101 mEq/L (ref 96–112)
GFR calc Af Amer: >90 mL/min (ref >90)
GFR calc non Af Amer: 80 mL/min — ABNORMAL LOW (ref >90)
GLUCOSE: 111 mg/dL — AB (ref 70–99)
Potassium: 3.8 mEq/L (ref 3.7–5.3)
Sodium: 141 mEq/L (ref 137–147)

## 2013-10-21 ENCOUNTER — Encounter (HOSPITAL_BASED_OUTPATIENT_CLINIC_OR_DEPARTMENT_OTHER)
Admission: RE | Disposition: A | Discharge status: Home / Self Care | Payer: Self-pay | Source: Ambulatory Visit | Attending: Orthopedic Surgery

## 2013-10-21 ENCOUNTER — Ambulatory Visit (HOSPITAL_BASED_OUTPATIENT_CLINIC_OR_DEPARTMENT_OTHER)
Admission: RE | Admit: 2013-10-21 | Discharge: 2013-10-21 | Disposition: A | Discharge status: Home / Self Care | Payer: Managed Care, Other (non HMO) | Source: Ambulatory Visit | Attending: Orthopedic Surgery | Admitting: Orthopedic Surgery

## 2013-10-21 ENCOUNTER — Encounter (HOSPITAL_BASED_OUTPATIENT_CLINIC_OR_DEPARTMENT_OTHER): Payer: Managed Care, Other (non HMO) | Admitting: Certified Registered"

## 2013-10-21 ENCOUNTER — Encounter (HOSPITAL_BASED_OUTPATIENT_CLINIC_OR_DEPARTMENT_OTHER): Payer: Self-pay | Admitting: Certified Registered"

## 2013-10-21 ENCOUNTER — Ambulatory Visit (HOSPITAL_BASED_OUTPATIENT_CLINIC_OR_DEPARTMENT_OTHER): Payer: Managed Care, Other (non HMO) | Admitting: Certified Registered"

## 2013-10-21 DIAGNOSIS — M2240 Chondromalacia patellae, unspecified knee: Secondary | ICD-10-CM | POA: Insufficient documentation

## 2013-10-21 DIAGNOSIS — M9420 Chondromalacia, unspecified site: Secondary | ICD-10-CM | POA: Insufficient documentation

## 2013-10-21 DIAGNOSIS — M6750 Plica syndrome, unspecified knee: Secondary | ICD-10-CM | POA: Insufficient documentation

## 2013-10-21 HISTORY — DX: Personal history of other diseases of the musculoskeletal system and connective tissue: Z87.39

## 2013-10-21 HISTORY — DX: Other tear of medial meniscus, current injury, unspecified knee, initial encounter: S83.249A

## 2013-10-21 HISTORY — DX: Unspecified cataract: H26.9

## 2013-10-21 HISTORY — DX: Type 2 diabetes mellitus without complications: E11.9

## 2013-10-21 HISTORY — PX: KNEE ARTHROSCOPY: SHX127

## 2013-10-21 HISTORY — DX: Presence of dental prosthetic device (complete) (partial): Z97.2

## 2013-10-21 LAB — POCT HEMOGLOBIN-HEMACUE: Hemoglobin: 11.4 g/dL — ABNORMAL LOW (ref 12.0–15.0)

## 2013-10-21 LAB — GLUCOSE, CAPILLARY
Glucose-Capillary: 101 mg/dL — ABNORMAL HIGH (ref 70–99)
Glucose-Capillary: 105 mg/dL — ABNORMAL HIGH (ref 70–99)

## 2013-10-21 SURGERY — ARTHROSCOPY, KNEE
Anesthesia: General | Site: Knee | Laterality: Right

## 2013-10-21 MED ORDER — PROPOFOL 10 MG/ML IV BOLUS
INTRAVENOUS | Status: AC
  Filled 2013-10-21: qty 20

## 2013-10-21 MED ORDER — FENTANYL CITRATE 0.05 MG/ML IJ SOLN
INTRAMUSCULAR | Status: AC
  Filled 2013-10-21: qty 6

## 2013-10-21 MED ORDER — LIDOCAINE HCL (CARDIAC) 20 MG/ML IV SOLN
INTRAVENOUS | Status: DC | PRN
  Administered 2013-10-21: 100 mg via INTRAVENOUS

## 2013-10-21 MED ORDER — FENTANYL CITRATE 0.05 MG/ML IJ SOLN: 50.0000 ug | INTRAMUSCULAR | Status: DC | PRN

## 2013-10-21 MED ORDER — BUPIVACAINE HCL (PF) 0.25 % IJ SOLN
INTRAMUSCULAR | Status: AC
  Filled 2013-10-21: qty 60

## 2013-10-21 MED ORDER — OXYCODONE HCL 5 MG PO TABS
5.0000 mg | ORAL_TABLET | Freq: Once | ORAL | Status: AC | PRN
  Administered 2013-10-21: 5 mg via ORAL

## 2013-10-21 MED ORDER — SODIUM CHLORIDE 0.9 % IR SOLN
Status: DC | PRN
  Administered 2013-10-21: 1

## 2013-10-21 MED ORDER — OXYCODONE HCL 5 MG PO TABS
ORAL_TABLET | ORAL | Status: AC
  Filled 2013-10-21: qty 1

## 2013-10-21 MED ORDER — BUPIVACAINE HCL (PF) 0.5 % IJ SOLN
INTRAMUSCULAR | Status: AC
  Filled 2013-10-21: qty 60

## 2013-10-21 MED ORDER — LACTATED RINGERS IV SOLN
INTRAVENOUS | Status: DC
  Administered 2013-10-21: 08:00:00 via INTRAVENOUS

## 2013-10-21 MED ORDER — HYDROMORPHONE HCL PF 1 MG/ML IJ SOLN: 0.2500 mg | INTRAMUSCULAR | Status: DC | PRN

## 2013-10-21 MED ORDER — OXYCODONE-ACETAMINOPHEN 5-325 MG PO TABS: 1.0000 | ORAL_TABLET | Freq: Four times a day (QID) | ORAL | Status: DC | PRN

## 2013-10-21 MED ORDER — PROPOFOL 10 MG/ML IV BOLUS
INTRAVENOUS | Status: DC | PRN
  Administered 2013-10-21: 200 mg via INTRAVENOUS

## 2013-10-21 MED ORDER — MIDAZOLAM HCL 2 MG/2ML IJ SOLN
INTRAMUSCULAR | Status: AC
  Filled 2013-10-21: qty 2

## 2013-10-21 MED ORDER — EPINEPHRINE HCL 1 MG/ML IJ SOLN
INTRAMUSCULAR | Status: DC | PRN
  Administered 2013-10-21: 1 mg

## 2013-10-21 MED ORDER — EPINEPHRINE HCL 1 MG/ML IJ SOLN
INTRAMUSCULAR | Status: AC
  Filled 2013-10-21: qty 1

## 2013-10-21 MED ORDER — MIDAZOLAM HCL 2 MG/2ML IJ SOLN: 1.0000 mg | INTRAMUSCULAR | Status: DC | PRN

## 2013-10-21 MED ORDER — BUPIVACAINE HCL (PF) 0.5 % IJ SOLN
INTRAMUSCULAR | Status: DC | PRN
  Administered 2013-10-21: 20 mL

## 2013-10-21 MED ORDER — ONDANSETRON HCL 4 MG/2ML IJ SOLN
INTRAMUSCULAR | Status: DC | PRN
  Administered 2013-10-21: 4 mg via INTRAVENOUS

## 2013-10-21 MED ORDER — CHLORHEXIDINE GLUCONATE 4 % EX LIQD: 60.0000 mL | Freq: Once | CUTANEOUS | Status: DC

## 2013-10-21 MED ORDER — CEFAZOLIN SODIUM-DEXTROSE 2-3 GM-% IV SOLR
2.0000 g | INTRAVENOUS | Status: AC
  Administered 2013-10-21: 2 g via INTRAVENOUS

## 2013-10-21 MED ORDER — MIDAZOLAM HCL 2 MG/ML PO SYRP: 12.0000 mg | ORAL_SOLUTION | Freq: Once | ORAL | Status: DC | PRN

## 2013-10-21 MED ORDER — OXYCODONE HCL 5 MG/5ML PO SOLN: 5.0000 mg | Freq: Once | ORAL | Status: AC | PRN

## 2013-10-21 MED ORDER — MIDAZOLAM HCL 5 MG/5ML IJ SOLN
INTRAMUSCULAR | Status: DC | PRN
  Administered 2013-10-21: 2 mg via INTRAVENOUS

## 2013-10-21 MED ORDER — ONDANSETRON HCL 4 MG/2ML IJ SOLN: 4.0000 mg | Freq: Once | INTRAMUSCULAR | Status: DC | PRN

## 2013-10-21 MED ORDER — DEXAMETHASONE SODIUM PHOSPHATE 10 MG/ML IJ SOLN
INTRAMUSCULAR | Status: DC | PRN
  Administered 2013-10-21: 4 mg via INTRAVENOUS

## 2013-10-21 MED ORDER — CEFAZOLIN SODIUM-DEXTROSE 2-3 GM-% IV SOLR
INTRAVENOUS | Status: AC
  Filled 2013-10-21: qty 50

## 2013-10-21 MED ORDER — FENTANYL CITRATE 0.05 MG/ML IJ SOLN
INTRAMUSCULAR | Status: DC | PRN
  Administered 2013-10-21: 100 ug via INTRAVENOUS
  Administered 2013-10-21: 50 ug via INTRAVENOUS

## 2013-10-21 SURGICAL SUPPLY — 43 items
BANDAGE ELASTIC 6 VELCRO ST LF (GAUZE/BANDAGES/DRESSINGS) ×2 IMPLANT
BLADE 4.2CUDA (BLADE) IMPLANT
BLADE GREAT WHITE 4.2 (BLADE) ×2 IMPLANT
CANISTER SUCT 3000ML (MISCELLANEOUS) IMPLANT
CUTTER MENISCUS  4.2MM (BLADE)
CUTTER MENISCUS 4.2MM (BLADE) IMPLANT
DRAPE ARTHROSCOPY W/POUCH 114 (DRAPES) ×2 IMPLANT
DRAPE INCISE IOBAN 66X45 STRL (DRAPES) ×1 IMPLANT
DRSG EMULSION OIL 3X3 NADH (GAUZE/BANDAGES/DRESSINGS) ×2 IMPLANT
DURAPREP 26ML APPLICATOR (WOUND CARE) ×2 IMPLANT
ELECT MENISCUS 165MM 90D (ELECTRODE) IMPLANT
ELECT REM PT RETURN 9FT ADLT (ELECTROSURGICAL)
ELECTRODE REM PT RTRN 9FT ADLT (ELECTROSURGICAL) IMPLANT
GLOVE BIO SURGEON STRL SZ7 (GLOVE) ×1 IMPLANT
GLOVE BIOGEL PI IND STRL 7.0 (GLOVE) IMPLANT
GLOVE BIOGEL PI IND STRL 7.5 (GLOVE) IMPLANT
GLOVE BIOGEL PI IND STRL 8 (GLOVE) ×2 IMPLANT
GLOVE BIOGEL PI INDICATOR 7.0 (GLOVE) ×1
GLOVE BIOGEL PI INDICATOR 7.5 (GLOVE) ×1
GLOVE BIOGEL PI INDICATOR 8 (GLOVE) ×2
GLOVE ECLIPSE 7.5 STRL STRAW (GLOVE) ×4 IMPLANT
GOWN STRL REUS W/ TWL LRG LVL3 (GOWN DISPOSABLE) ×1 IMPLANT
GOWN STRL REUS W/ TWL XL LVL3 (GOWN DISPOSABLE) ×1 IMPLANT
GOWN STRL REUS W/TWL LRG LVL3 (GOWN DISPOSABLE) ×2
GOWN STRL REUS W/TWL XL LVL3 (GOWN DISPOSABLE) ×6 IMPLANT
HOLDER KNEE FOAM BLUE (MISCELLANEOUS) ×2 IMPLANT
KNEE WRAP E Z 3 GEL PACK (MISCELLANEOUS) ×2 IMPLANT
MANIFOLD NEPTUNE II (INSTRUMENTS) ×1 IMPLANT
NDL SAFETY ECLIPSE 18X1.5 (NEEDLE) IMPLANT
NEEDLE HYPO 18GX1.5 SHARP (NEEDLE)
PACK ARTHROSCOPY DSU (CUSTOM PROCEDURE TRAY) ×2 IMPLANT
PACK BASIN DAY SURGERY FS (CUSTOM PROCEDURE TRAY) ×2 IMPLANT
PAD CAST 4YDX4 CTTN HI CHSV (CAST SUPPLIES) ×1 IMPLANT
PADDING CAST COTTON 4X4 STRL (CAST SUPPLIES) ×2
PENCIL BUTTON HOLSTER BLD 10FT (ELECTRODE) IMPLANT
SET ARTHROSCOPY TUBING (MISCELLANEOUS) ×2
SET ARTHROSCOPY TUBING LN (MISCELLANEOUS) ×1 IMPLANT
SPONGE GAUZE 4X4 12PLY (GAUZE/BANDAGES/DRESSINGS) ×2 IMPLANT
SUT ETHILON 4 0 PS 2 18 (SUTURE) IMPLANT
SYR 5ML LL (SYRINGE) ×2 IMPLANT
TOWEL OR 17X24 6PK STRL BLUE (TOWEL DISPOSABLE) ×2 IMPLANT
TOWEL OR NON WOVEN STRL DISP B (DISPOSABLE) ×2 IMPLANT
WATER STERILE IRR 1000ML POUR (IV SOLUTION) ×2 IMPLANT

## 2013-10-21 NOTE — Brief Op Note (Signed)
10/21/2013  9:27 AM  PATIENT:  Cynthia Wilson  48 y.o. 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   Delay start of Pharmacological VTE agent (>24hrs) due to surgical blood loss or risk of bleeding: no

## 2013-10-21 NOTE — Anesthesia Postprocedure Evaluation (Signed)
  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

## 2013-10-21 NOTE — Anesthesia Preprocedure Evaluation (Signed)
Anesthesia Evaluation  Patient identified by MRN, date of birth, ID band Patient awake    Reviewed: Allergy & Precautions, H&P , NPO status , Patient's Chart, lab work & pertinent test results  Airway       Dental   Pulmonary          Cardiovascular hypertension, Pt. on medications     Neuro/Psych    GI/Hepatic GERD-  Medicated and Controlled,  Endo/Other  diabetes, Well Controlled, Type 2, Oral Hypoglycemic AgentsMorbid obesity  Renal/GU      Musculoskeletal   Abdominal   Peds  Hematology   Anesthesia Other Findings   Reproductive/Obstetrics                           Anesthesia Physical Anesthesia Plan  ASA: III  Anesthesia Plan: General   Post-op Pain Management:    Induction: Intravenous  Airway Management Planned: LMA  Additional Equipment:   Intra-op Plan:   Post-operative Plan: Extubation in OR  Informed Consent: I have reviewed the patients History and Physical, chart, labs and discussed the procedure including the risks, benefits and alternatives for the proposed anesthesia with the patient or authorized representative who has indicated his/her understanding and acceptance.   Dental advisory given  Plan Discussed with: CRNA, Anesthesiologist and Surgeon  Anesthesia Plan Comments:         Anesthesia Quick Evaluation

## 2013-10-21 NOTE — Anesthesia Procedure Notes (Signed)
Procedure Name: LMA Insertion Date/Time: 10/21/2013 8:58 AM Performed by: Baxter Flattery Pre-anesthesia Checklist: Patient identified, Emergency Drugs available, Suction available and Patient being monitored Patient Re-evaluated:Patient Re-evaluated prior to inductionOxygen Delivery Method: Circle System Utilized Preoxygenation: Pre-oxygenation with 100% oxygen Intubation Type: IV induction Ventilation: Mask ventilation without difficulty LMA: LMA inserted LMA Size: 4.0 Number of attempts: 1 Airway Equipment and Method: bite block Placement Confirmation: positive ETCO2 and breath sounds checked- equal and bilateral Tube secured with: Tape Dental Injury: Teeth and Oropharynx as per pre-operative assessment

## 2013-10-21 NOTE — H&P (Signed)
PREOPERATIVE H&P  Chief Complaint:R knee pain  HPI: Cynthia Wilson is a 48 y.o. female who presents for evaluation of r knee pain. It has been present for 6 montns and has been worsening. She has failed conservative measures. Pain is rated as moderate.  Past Medical History  Diagnosis Date  . GERD (gastroesophageal reflux disease)   . Hypertension     under control with med., has been on med. x 5 yr.  . Non-insulin dependent type 2 diabetes mellitus   . History of osteoarthritis     left knee  . Wears partial dentures     upper  . Immature cataract   . Medial meniscus tear 09/2013    right knee   Past Surgical History  Procedure Laterality Date  . Knee arthroscopy Left 2011  . Total knee arthroplasty Left 02/11/2013    Procedure: LEFT TOTAL KNEE ARTHROPLASTY;  Surgeon: Alta Corning, MD;  Location: [place];  Service: Orthopedics;  Laterality: Left;  NO COMPUTER  . Salpingoophorectomy Left 04/21/2013    Procedure: SALPINGO OOPHORECTOMY;  Surgeon: Lavonia Drafts, MD;  Location: [facility];  Service: Gynecology;  Laterality: Left;  . Laparoscopy N/A 04/21/2013    Procedure: LAPAROSCOPY DIAGNOSTIC;  Surgeon: Lavonia Drafts, MD;  Location: [facility];  Service: Gynecology;  Laterality: N/A;  . Laparotomy N/A 04/21/2013    Procedure: LAPAROTOMY;  Surgeon: Lavonia Drafts, MD;  Location: [facility];  Service: Gynecology;  Laterality: N/A;  . Total abdominal hysterectomy  08/28/2005  . Lysis of adhesion  08/28/2005; 03/25/2007; 04/21/2013  . Unilateral salpingectomy Right 08/28/2005  . Cyst excision  03/25/2007    resection peritoneal inclusion cyst  . Umbilical hernia repair  1984   History   Social History  . Marital Status: Married    Spouse Name: N/A    Number of Children: N/A  . Years of Education: N/A   Social History Main Topics  . Smoking status: Never Smoker   . Smokeless tobacco: Never Used  . Alcohol Use: No  . Drug Use: No  . Sexual Activity: Yes    Birth  Control/ Protection: Surgical   Other Topics Concern  . None   Social History Narrative   Lives with husband and 53 year old son.  Works as a Research scientist (physical sciences).   Family History  Problem Relation Age of Onset  . Diabetes Mother   . Hypertension Mother   . Hyperlipidemia Mother   . Emphysema Father    Allergies  Allergen Reactions  . Iohexol Hives        . Other Rash    COBAN   Prior to Admission medications   Medication Sig Start Date End Date Taking? Authorizing Provider  hydrochlorothiazide (HYDRODIURIL) 25 MG tablet TAKE 1 TABLET (25 MG TOTAL) BY MOUTH DAILY. 08/31/13  Yes Andrena Mews, MD  HYDROcodone-acetaminophen (NORCO/VICODIN) 5-325 MG per tablet Take 1 tablet by mouth every 6 (six) hours as needed for moderate pain.   Yes Historical Provider, MD  loratadine (CLARITIN) 10 MG tablet Take 10 mg by mouth daily as needed for allergies.    Yes Historical Provider, MD  metFORMIN (GLUCOPHAGE-XR) 500 MG 24 hr tablet TAKE 1 TABLET (500 MG TOTAL) BY MOUTH DAILY WITH BREAKFAST. 05/22/13  Yes Andrena Mews, MD  omeprazole (PRILOSEC) 20 MG capsule TAKE 1 CAPSULE (20 MG TOTAL) BY MOUTH DAILY. FOR HEARTBURN 05/19/13  Yes Andrena Mews, MD     Positive ROS: none   All other systems have been reviewed and were  otherwise negative with the exception of those mentioned in the HPI and as above.  Physical Exam: Filed Vitals:   10/21/13 0708  BP: 134/87  Pulse: 82  Temp: 98.2 F (36.8 C)  Resp: 18    General: Alert, no acute distress Cardiovascular: No pedal edema Respiratory: No cyanosis, no use of accessory musculature GI: No organomegaly, abdomen is soft and non-tender Skin: No lesions in the area of chief complaint Neurologic: Sensation intact distally Psychiatric: Patient is competent for consent with normal mood and affect Lymphatic: No axillary or cervical lymphadenopathy  MUSCULOSKELETAL: r knee +med jt line tender// no instability  Assessment/Plan: MEDIAL MENISCUS  TEAR Plan for Procedure(s): ARTHROSCOPY RIGHT KNEE  The risks benefits and alternatives were discussed with the patient including but not limited to the risks of nonoperative treatment, versus surgical intervention including infection, bleeding, nerve injury, malunion, nonunion, hardware prominence, hardware failure, need for hardware removal, blood clots, cardiopulmonary complications, morbidity, mortality, among others, and they were willing to proceed.  Predicted outcome is good, although there will be at least a six to nine month expected recovery.  Alta Corning, MD 10/21/2013 8:27 AM

## 2013-10-21 NOTE — Op Note (Signed)
NAMESHERENA, Cynthia Wilson              ACCOUNT NO.:  [id number]  MEDICAL RECORD NO.:  [id number]  LOCATION:                                 FACILITY:  PHYSICIAN:  Alta Corning, M.D.   DATE OF BIRTH:  Jan 27, 1966  DATE OF PROCEDURE:  10/21/2013 DATE OF DISCHARGE:  10/21/2013                              OPERATIVE REPORT   PREOPERATIVE DIAGNOSIS:  Medial meniscal tear.  POSTOPERATIVE DIAGNOSES: 1. Chondromalacia, medial compartment. 2. Chondromalacia, lateral compartment. 3. Chondromalacia, patellofemoral compartment. 4. Medial shelf plica.  PROCEDURES: 1. Chondroplasty, medial compartment. 2. Chondroplasty, lateral compartment. 3. Chondroplasty, patellofemoral joint. 4. Debridement of medial shelf plica.  SURGEON:  Alta Corning, M.D.  ASSISTANT:  Gary Fleet, P.A.  ANESTHESIA:  General.  BRIEF HISTORY:  [name] is a 48 year old female with a history of significant complaints of right knee pain.  She had been treated conservatively for a period of time.  X-ray showed narrowing but not bone-on-bone change.  She continued to have symptoms.  We talked about MRI examination versus activity modification versus surgical intervention.  She elected for arthroscopy, and she was taken to the operating room for this procedure.  PROCEDURE IN DETAIL:  The patient was taken to the operating room. After adequate anesthesia was obtained with general anesthetic, the patient was placed supine on the operating table.  Right leg was then prepped and draped in usual sterile fashion.  Following this, routine arthroscopic examination of the knee revealed there was significant chondromalacia of the medial compartment.  This was debrided back to a smooth and stable rim.  The medial meniscus was probed at length, and there were no tears of the meniscus.  Attention was then turned towards the lateral compartment where the lateral compartment was debrided. There was some fraying of the  meniscus which was debrided as well. There was significant chondromalacia in the posterior aspect of the tibial plateau which was debrided.  The patellofemoral joint was then addressed and debrided as well.  There was a large medial shelf plica which was debrided.  This went around to an anterior plica which was debrided as well.  At this point, the knee was copiously and thoroughly lavaged, suctioned dry.  All sterile portals were closed with bandage.  Sterile compression was then applied.  The patient was taken to recovery room and was noted to be in satisfactory condition.  Estimated blood loss for the procedure was minimal.     Alta Corning, M.D.   ______________________________ Alta Corning, M.D.    Corliss Skains  D:  10/21/2013  T:  10/21/2013  Job:  [id number]

## 2013-10-21 NOTE — Discharge Instructions (Signed)

## 2013-10-21 NOTE — Transfer of Care (Signed)
Immediate Anesthesia Transfer of Care Note  Patient: Cynthia Wilson  Procedure(s) Performed: Procedure(s) with comments: ARTHROSCOPY RIGHT KNEE (Right) - medial , lateral and patella femoral chondromalsia and medial plica  Patient Location: PACU  Anesthesia Type:General  Level of Consciousness: awake, alert  and patient cooperative  Airway & Oxygen Therapy: Patient Spontanous Breathing and Patient connected to face mask oxygen  Post-op Assessment: Report given to PACU RN, Post -op Vital signs reviewed and stable and Patient moving all extremities  Post vital signs: Reviewed and stable  Complications: No apparent anesthesia complications

## 2013-10-24 ENCOUNTER — Encounter (HOSPITAL_BASED_OUTPATIENT_CLINIC_OR_DEPARTMENT_OTHER): Payer: Self-pay | Admitting: Orthopedic Surgery

## 2013-11-22 ENCOUNTER — Ambulatory Visit
Admission: RE | Admit: 2013-11-22 | Discharge: 2013-11-22 | Disposition: A | Discharge status: Home / Self Care | Payer: Managed Care, Other (non HMO) | Source: Ambulatory Visit

## 2013-11-22 ENCOUNTER — Encounter (INDEPENDENT_AMBULATORY_CARE_PROVIDER_SITE_OTHER): Payer: Self-pay

## 2013-11-22 DIAGNOSIS — Z1231 Encounter for screening mammogram for malignant neoplasm of breast: Secondary | ICD-10-CM

## 2013-12-03 ENCOUNTER — Other Ambulatory Visit: Payer: Self-pay | Admitting: Family Medicine

## 2013-12-15 ENCOUNTER — Telehealth: Payer: Self-pay | Admitting: Family Medicine

## 2013-12-19 ENCOUNTER — Other Ambulatory Visit (INDEPENDENT_AMBULATORY_CARE_PROVIDER_SITE_OTHER): Payer: Managed Care, Other (non HMO)

## 2013-12-19 DIAGNOSIS — E119 Type 2 diabetes mellitus without complications: Secondary | ICD-10-CM

## 2013-12-19 DIAGNOSIS — I10 Essential (primary) hypertension: Secondary | ICD-10-CM

## 2013-12-19 LAB — POCT GLYCOSYLATED HEMOGLOBIN (HGB A1C): Hemoglobin A1C: 6.1

## 2013-12-19 LAB — LDL CHOLESTEROL, DIRECT: LDL DIRECT: 85 mg/dL

## 2013-12-19 NOTE — Progress Notes (Signed)
D-LDL & A1c drawn

## 2013-12-21 NOTE — Telephone Encounter (Signed)
Called Pt to schedule for LDL and A1C lab work as part of DM care. Please make appointment upon returned call.

## 2014-01-19 ENCOUNTER — Ambulatory Visit (INDEPENDENT_AMBULATORY_CARE_PROVIDER_SITE_OTHER): Payer: Managed Care, Other (non HMO) | Admitting: Family Medicine

## 2014-01-19 VITALS — BP 138/80 | HR 80

## 2014-01-19 DIAGNOSIS — R42 Dizziness and giddiness: Secondary | ICD-10-CM

## 2014-01-19 NOTE — Progress Notes (Signed)
   Pt in nurse clinic for blood pressure check. Pt complaining of dizziness and headache.  Pt blood pressure 150/90 manually and heart rate 80.  Will forward to PCP. Derl Barrow, RN

## 2014-01-19 NOTE — Progress Notes (Signed)
Patient ID: Cynthia Wilson, female   DOB: 01-12-1966, 48 y.o.   MRN: [id number]   Subjective:    Patient ID: Cynthia Wilson, female    DOB: 01-14-1966, 48 y.o.   MRN: [id number]  HPI  CC: elevated BP, dizziness  # Dizziness:  Woke up this morning and felt a little dizzy, describes as lightheaded, no sensation of room spinning. Has also had a headache during this time.  Went to work and continued to feel the same with intermittent "dizzy" feelings. 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  She has been having some nasal congestion over past few days  Denies any chest pains, SOB  Denies hot flashes, says she had a partial hysterectomy. Can't remember her last period. ROS: No fever/chill, no CP, no SOB, no abdominal pain, no diarrhea/constipation, no dysuria, no numbness/tingling/weakness of extremities  Review of Systems   See HPI for ROS. Objective:  BP 150/90  Pulse 80  Repeat BP 138/80  General: NAD HEENT: PERRL, EOMI, MMM. No nasal discharge. CV: RRR, normal s1/s2, no murmur appreciated today (has had one noted in the past). 2+ radial and PT pulses bilaterally. Resp: CTAB, normal effort. No w/r/c. Ext: no edema or cyanosis, WWP. Neuro: alert and oriented. CN2-12 are normal. Strength 5/5 bilaterally to grip and hip flexion. Gait is slow but normal. No nystagmus noted.     Assessment & Plan:  See Problem List Documentation

## 2014-01-19 NOTE — Patient Instructions (Signed)
It was nice to meet you today.  Your exam and blood pressure in clinic are normal. At this time I do not think you need to have any further testing. If symptoms worsen, you develop chest pain, changes in vision, numbness/weakness of your arms and legs, please call the clinic (and if closed go to the emergency room).  For your headache, make sure you drink plenty of water today, take over the counter pain medications like tylenol, ibuprofen, or aleve.

## 2014-01-19 NOTE — Assessment & Plan Note (Addendum)
First episode today, though chart shows previous episodes of vertigo that pt says have been resolved for a while. Report of elevated BP at work but normal in clinic, no missed doses of HCTZ. CV/Resp/Neuro exams are normal. No red flags for cardiac or neuro etiology. Given she has a headache, this could be contributing to current symptoms. Patient unable to report by history if she is going through menopause (partial hysterectomy), an [place] done in 04/2013 showed no menopause at that time; denies any hot flashes currently. P: OTC pain management for headache, work note given (may return tomorrow). Discussed return precautions and need for further evaluation. No labs at this time.

## 2014-02-04 ENCOUNTER — Other Ambulatory Visit: Payer: Self-pay | Admitting: Family Medicine

## 2014-04-12 ENCOUNTER — Telehealth: Payer: Self-pay | Admitting: Family Medicine

## 2014-04-12 NOTE — Telephone Encounter (Signed)
I called to recommend A1C follow for her DM as well and FLD. I also suggested starting ACEi. I recommended she schedule appointment with me to assess her BP and start ACEi if necessary.

## 2014-04-13 ENCOUNTER — Other Ambulatory Visit (INDEPENDENT_AMBULATORY_CARE_PROVIDER_SITE_OTHER): Payer: Managed Care, Other (non HMO)

## 2014-04-13 DIAGNOSIS — E119 Type 2 diabetes mellitus without complications: Secondary | ICD-10-CM

## 2014-04-13 LAB — LIPID PANEL
CHOL/HDL RATIO: 4 ratio
CHOLESTEROL: 148 mg/dL (ref 0–200)
HDL: 37 mg/dL — AB (ref >39)
LDL Cholesterol: 95 mg/dL (ref 0–99)
TRIGLYCERIDES: 81 mg/dL (ref <150)
VLDL: 16 mg/dL (ref 0–40)

## 2014-04-13 LAB — POCT GLYCOSYLATED HEMOGLOBIN (HGB A1C): HEMOGLOBIN A1C: 6.3

## 2014-04-17 ENCOUNTER — Encounter: Payer: Self-pay | Admitting: Family Medicine

## 2014-04-17 NOTE — Telephone Encounter (Signed)
Pt informed and appt made for Nov 3. Fleeger, Salome Spotted

## 2014-05-01 ENCOUNTER — Encounter (HOSPITAL_BASED_OUTPATIENT_CLINIC_OR_DEPARTMENT_OTHER): Payer: Self-pay | Admitting: Orthopedic Surgery

## 2014-05-02 ENCOUNTER — Ambulatory Visit (INDEPENDENT_AMBULATORY_CARE_PROVIDER_SITE_OTHER): Payer: Managed Care, Other (non HMO) | Admitting: Family Medicine

## 2014-05-02 ENCOUNTER — Encounter: Payer: Self-pay | Admitting: Family Medicine

## 2014-05-02 VITALS — BP 131/84 | HR 73 | Ht 66.0 in | Wt 242.0 lb

## 2014-05-02 DIAGNOSIS — I10 Essential (primary) hypertension: Secondary | ICD-10-CM

## 2014-05-02 DIAGNOSIS — Z0000 Encounter for general adult medical examination without abnormal findings: Secondary | ICD-10-CM

## 2014-05-02 DIAGNOSIS — E119 Type 2 diabetes mellitus without complications: Secondary | ICD-10-CM

## 2014-05-02 DIAGNOSIS — Z01419 Encounter for gynecological examination (general) (routine) without abnormal findings: Secondary | ICD-10-CM | POA: Insufficient documentation

## 2014-05-02 NOTE — Patient Instructions (Signed)
It was nice seeing you today, we talked about the effect of Dm and BP on your kidnesy and the need to be on Lisinopril, as per you preference we will check your urine forprotein today and if it is negative we can hold off on Lisinopril for now. i will see you back in 3-4 months.   Diabetic Nephropathy Diabetic nephropathy is a complication of diabetes that leads to damaged kidneys. It develops slowly. The function of healthy kidneys is to filter and clean blood. Kidneys also get rid of body waste products and extra fluid. When the kidney filters are damaged, there is protein loss in the urine, a decline in kidney function, a buildup of kidney waste products and fluid, and high blood pressure. The damage progresses until the kidneys fail.  RISK FACTORS  High blood pressure (hypertension).  High blood sugar (hyperglycemia).  Family history.  Aging.  Obstruction problems affecting the kidneys, the tubes that drain the kidneys (ureters), or the bladder.  Taking certain drugs or medicines. SYMPTOMS  Symptoms may not be seen or felt for many years. You may not notice any signs of kidney failure until your kidneys have lost much of their ability to function. An early sign of damage is when small amounts of protein (albumin) leak into the urine. However, this can only be found through a urine test. Without physical symptoms, a urine test is often not performed. When the kidneys fail, you may feel one or more of the following:  Swelling of the hands and feet from the extra fluid in your body.  Constant upset stomach.  Constant fatigue. DIAGNOSIS When someone has diabetes, screening tests are done to look for any early signs of problems before symptoms develop and before damage has already been done. These tests may include:   Annual urine tests to screen for trace amounts of protein in the urine (microalbuminuria).  Urine collectionover 24 hours to measure kidney function.  Blood tests  that measure kidney function. Your caregiver is aware that problems other than diabetes can damage kidneys. If screening tests show early kidney damage, but it is thought that a different problem is causing the damage, other tests may be performed. Examples of these tests include:  An ultrasound of your kidney.  Taking a tissue sample (biopsy) from the kidney. TREATMENT The goal of treatment is to prevent or slow down damage to your kidneys. Controlling hypertension and hyperglycemia is critical. Your goal is to maintain a blood pressure below 120/80. If you have certain other medical problems, this goal may be different. Talk to your caregiver to make sure that your blood pressure goal is right for your needs. Regular testing of your blood glucose at home is important. Your goal is to have a normal blood glucose (110 or less when fasting) as often as possible.  In addition, maintaining your hemoglobin A1c level at less than 7% reduces your risk for complications, including kidney damage. Common treatments include:  Dieting by controlling what you eat as well as the portion sizes.  Exercising to control blood pressure and blood glucose.  Taking medicines.  Giving yourself insulin injections if your caregiver feels that it is necessary.  Getting early treatment for urinary tract infections.  Regularly following up with your caregiver. If your disease progresses to end-stage kidney failure, you will need dialysis or a transplant. Dialysis can be done in 1 of 2 ways:  Hemodialysis. Your blood flows from a tube in your arm through a machine. The  machine filters waste and extra fluid. The clean blood flows back into your arm.  Peritoneal dialysis. Your abdomen is filled with a special fluid. The fluid collects waste products and extra fluid from your blood. The fluid is then drained from your abdomen and discarded. SEEK MEDICAL CARE IF:   You are having problems keeping your blood glucose  in the goal range.  You have swelling of the hands or feet.  You have weakness.  You have muscles spasms.  You have a constant upset stomach.  You feel tired all the time and this is not normal for you. SEEK IMMEDIATE MEDICAL CARE IF:  You have unusual dizziness or weakness.  You have excessive sleepiness.  You have a seizure or convulsion.  You have severe, painful muscle spasms.  You have shortness of breath or trouble breathing.  You pass out or have a fainting episode.  You have chest pains. MAKE SURE YOU:  Understand these instructions.  Will watch your condition.  Will get help right away if you are not doing well or get worse. Document Released: 07/06/2007 Document Revised: 02/16/2013 Document Reviewed: 02/05/2011 [facility] Patient Information 2015 [place], [place]. This information is not intended to replace advice given to you by your health care provider. Make sure you discuss any questions you have with your health care provider.

## 2014-05-02 NOTE — Progress Notes (Signed)
Subjective:     Patient ID: Cynthia Wilson, female   DOB: 05-17-1966, 48 y.o.   MRN: [id number]  HPI  DM2: Currently on Metformin XR 500 mg qd, she is compliant with regimen, here to follow up. HTN: Compliant with HCTZ which she stated she had been on for years. Health maintenance: She stated she had flu shot at work [employer] home) last month i.e Oct. She had normal colonoscopy done years ago at [facility]. She stated she is due for her eye exam.  Current Outpatient Prescriptions on File Prior to Visit  Medication Sig Dispense Refill  . hydrochlorothiazide (HYDRODIURIL) 25 MG tablet TAKE 1 TABLET (25 MG TOTAL) BY MOUTH DAILY. 30 tablet 4  . loratadine (CLARITIN) 10 MG tablet Take 10 mg by mouth daily as needed for allergies.     . metFORMIN (GLUCOPHAGE-XR) 500 MG 24 hr tablet TAKE 1 TABLET (500 MG TOTAL) BY MOUTH DAILY WITH BREAKFAST. 30 tablet 5  . omeprazole (PRILOSEC) 20 MG capsule TAKE 1 CAPSULE (20 MG TOTAL) BY MOUTH DAILY. FOR HEARTBURN 30 capsule 4   No current facility-administered medications on file prior to visit.   Past Medical History  Diagnosis Date  . GERD (gastroesophageal reflux disease)   . Hypertension     under control with med., has been on med. x 5 yr.  . Non-insulin dependent type 2 diabetes mellitus   . History of osteoarthritis     left knee  . Wears partial dentures     upper  . Immature cataract   . Medial meniscus tear 09/2013    right knee     Review of Systems  Respiratory: Negative.   Cardiovascular: Negative.   Gastrointestinal: Negative.   Neurological: Negative.   All other systems reviewed and are negative.      Filed Vitals:   05/02/14 0902  BP: 131/84  Pulse: 73  Height: 5\' 6"  (1.676 m)  Weight: 242 lb (109.77 kg)    Objective:   Physical Exam  Constitutional: She is oriented to person, place, and time. She appears well-developed. No distress.  Cardiovascular: Normal rate, regular rhythm, normal heart sounds and intact distal  pulses.   No murmur heard. Pulmonary/Chest: Effort normal and breath sounds normal. No respiratory distress. She has no wheezes.  Abdominal: Soft. Bowel sounds are normal. She exhibits no distension and no mass. There is no tenderness.  Musculoskeletal: Normal range of motion. She exhibits no edema.  Sensory exam of the foot is normal, tested with the monofilament. Good pulses, no lesions or ulcers, good peripheral pulses.  Neurological: She is alert and oriented to person, place, and time.  Nursing note and vitals reviewed.      Assessment:     DM 2 well controlled HTN  Health maintenance    Plan:     Check problem list.      More than 25 min was sepnt for this face to face encounter and coordination of care.

## 2014-05-02 NOTE — Assessment & Plan Note (Signed)
Mentioned she got flu shot at work last month. She agreed to call ophthalmologist for routine eye exam. I recommended calling her GI for colonoscopy follow up since she is now 84 yrs. She verbalized understanding and agreed with plan.

## 2014-05-02 NOTE — Assessment & Plan Note (Signed)
BP optimal on current regimen. Continue HCTZ 25 mg qd.

## 2014-05-02 NOTE — Assessment & Plan Note (Signed)
Last A1C normal. DM well controlled. Continue current dose of metformin. She plan on calling her ophthalmologist for eye exam. Normal DM foot exam completed today. I recommended switching HCTZ to ACE for renal protection but she declined. Urine microalbumin checked today, I will call her with result.

## 2014-05-03 ENCOUNTER — Telehealth: Payer: Self-pay | Admitting: Family Medicine

## 2014-05-03 LAB — MICROALBUMIN / CREATININE URINE RATIO
Creatinine, Urine: 452.9 mg/dL
Microalb Creat Ratio: 3.5 mg/g (ref 0.0–30.0)
Microalb, Ur: 1.6 mg/dL (ref <2.0)

## 2014-05-03 NOTE — Telephone Encounter (Signed)
I spoke with patient about her urine microalbumin test result which was normal, all questions were answered.

## 2014-05-29 ENCOUNTER — Other Ambulatory Visit: Payer: Self-pay | Admitting: Family Medicine

## 2014-06-04 ENCOUNTER — Other Ambulatory Visit: Payer: Self-pay | Admitting: Family Medicine

## 2014-06-20 ENCOUNTER — Telehealth: Payer: Self-pay | Admitting: Family Medicine

## 2014-06-20 DIAGNOSIS — E041 Nontoxic single thyroid nodule: Secondary | ICD-10-CM

## 2014-06-20 NOTE — Telephone Encounter (Signed)
LM for patient to call back.  Please help her make a lab appt for repeat thyroid check and also to see Dr. Gwendlyn Deutscher to follow up on her ultrasound. Kavi Almquist,CMA

## 2014-06-20 NOTE — Telephone Encounter (Signed)
I got results from patient which she had done at [facility] in Oct, her TSH was low, not clear if she was started on medication at the time.  Cynthia Wilson/Cynthia Wilson please have patient come in for repeat blood test with Cynthia Wilson. I will place order for TFT. Thanks.   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 Have her follow up with me to discuss this if not already done.

## 2014-06-29 ENCOUNTER — Other Ambulatory Visit: Payer: Self-pay | Admitting: Family Medicine

## 2014-07-07 ENCOUNTER — Ambulatory Visit (INDEPENDENT_AMBULATORY_CARE_PROVIDER_SITE_OTHER): Payer: Managed Care, Other (non HMO) | Admitting: Family Medicine

## 2014-07-07 ENCOUNTER — Encounter: Payer: Self-pay | Admitting: Family Medicine

## 2014-07-07 VITALS — BP 134/86 | HR 86 | Temp 98.1°F | Ht 66.0 in | Wt 241.0 lb

## 2014-07-07 DIAGNOSIS — E079 Disorder of thyroid, unspecified: Secondary | ICD-10-CM

## 2014-07-07 DIAGNOSIS — E119 Type 2 diabetes mellitus without complications: Secondary | ICD-10-CM

## 2014-07-07 DIAGNOSIS — I10 Essential (primary) hypertension: Secondary | ICD-10-CM

## 2014-07-07 DIAGNOSIS — E049 Nontoxic goiter, unspecified: Secondary | ICD-10-CM

## 2014-07-07 DIAGNOSIS — E669 Obesity, unspecified: Secondary | ICD-10-CM

## 2014-07-07 LAB — POCT GLYCOSYLATED HEMOGLOBIN (HGB A1C): HEMOGLOBIN A1C: 5.9

## 2014-07-07 NOTE — Progress Notes (Signed)
Subjective:     Patient ID: Cynthia Wilson, female   DOB: 17-May-1966, 49 y.o.   MRN: [id number]  HPI  Goitre: Here to follow up for abnormal lab and U/S of her thyroid. She has hx of Thyroid biopsy many years ago which was normal. Denies any unintentional weight loss, no GI symptoms, no tremors. HTN/DM:Compliant with medications, here for follow up. Weight loss:Started weight loss program at [facility], she was started on Phentermine, denies s/e to medication.  Current Outpatient Prescriptions on File Prior to Visit  Medication Sig Dispense Refill  . hydrochlorothiazide (HYDRODIURIL) 25 MG tablet TAKE 1 TABLET (25 MG TOTAL) BY MOUTH DAILY. 30 tablet 4  . metFORMIN (GLUCOPHAGE-XR) 500 MG 24 hr tablet TAKE 1 TABLET (500 MG TOTAL) BY MOUTH DAILY WITH BREAKFAST. 30 tablet 5  . omeprazole (PRILOSEC) 20 MG capsule TAKE 1 CAPSULE (20 MG TOTAL) BY MOUTH DAILY. FOR HEARTBURN 30 capsule 2  . loratadine (CLARITIN) 10 MG tablet Take 10 mg by mouth daily as needed for allergies.      No current facility-administered medications on file prior to visit.   Past Medical History  Diagnosis Date  . GERD (gastroesophageal reflux disease)   . Hypertension     under control with med., has been on med. x 5 yr.  . Non-insulin dependent type 2 diabetes mellitus   . History of osteoarthritis     left knee  . Wears partial dentures     upper  . Immature cataract   . Medial meniscus tear 09/2013    right knee      Review of Systems  Constitutional: Negative for fever, fatigue and unexpected weight change.  Respiratory: Negative.   Cardiovascular: Negative.   Gastrointestinal: Negative.   Genitourinary: Negative.   Neurological: Negative for tremors.  All other systems reviewed and are negative.  Filed Vitals:   07/07/14 0905  BP: 134/86  Pulse: 86  Temp: 98.1 F (36.7 C)  TempSrc: Oral  Height: 5\' 6"  (1.676 m)  Weight: 241 lb (109.317 kg)       Objective:   Physical Exam   Constitutional: She is oriented to person, place, and time. She appears well-developed. No distress.  Neck:  Very small thyromegaly.  Cardiovascular: Normal rate, regular rhythm, normal heart sounds and intact distal pulses.   No murmur heard. Pulmonary/Chest: Effort normal and breath sounds normal. No respiratory distress. She has no wheezes.  Abdominal: Soft. Bowel sounds are normal. She exhibits no distension and no mass. There is no tenderness.  Musculoskeletal: Normal range of motion.  Neurological: She is alert and oriented to person, place, and time.  Nursing note and vitals reviewed.      Assessment:     Goitre/thyroid disease DM2 HTN Obesity     Plan:     Check problem list.

## 2014-07-07 NOTE — Assessment & Plan Note (Signed)
A1C improved a lot from last visit. Continue current regimen.

## 2014-07-07 NOTE — Patient Instructions (Signed)
Hyperthyroidism  The thyroid is a large gland located in the lower front part of your neck. The thyroid helps control metabolism. Metabolism is how your body uses food. It controls metabolism with the hormone thyroxine. When the thyroid is overactive, it produces too much hormone. When this happens, these following problems may occur:   · Nervousness  · Heat intolerance  · Weight loss (in spite of increase food intake)  · Diarrhea  · Change in hair or skin texture  · Palpitations (heart skipping or having extra beats)  · Tachycardia (rapid heart rate)  · Loss of menstruation (amenorrhea)  · Shaking of the hands  CAUSES  · Grave's Disease (the immune system attacks the thyroid gland). This is the most common cause.  · Inflammation of the thyroid gland.  · Tumor (usually benign) in the thyroid gland or elsewhere.  · Excessive use of thyroid medications (both prescription and 'natural').  · Excessive ingestion of Iodine.  DIAGNOSIS   To prove hyperthyroidism, your caregiver may do blood tests and ultrasound tests. Sometimes the signs are hidden. It may be necessary for your caregiver to watch this illness with blood tests, either before or after diagnosis and treatment.  TREATMENT  Short-term treatment  There are several treatments to control symptoms. Drugs called beta blockers may give some relief. Drugs that decrease hormone production will provide temporary relief in many people. These measures will usually not give permanent relief.  Definitive therapy  There are treatments available which can be discussed between you and your caregiver which will permanently treat the problem. These treatments range from surgery (removal of the thyroid), to the use of radioactive iodine (destroys the thyroid by radiation), to the use of antithyroid drugs (interfere with hormone synthesis). The first two treatments are permanent and usually successful. They most often require hormone replacement therapy for life. This is because  it is impossible to remove or destroy the exact amount of thyroid required to make a person euthyroid (normal).  HOME CARE INSTRUCTIONS   See your caregiver if the problems you are being treated for get worse. Examples of this would be the problems listed above.  SEEK MEDICAL CARE IF:  Your general condition worsens.  MAKE SURE YOU:   · Understand these instructions.  · Will watch your condition.  · Will get help right away if you are not doing well or get worse.  Document Released: 06/16/2005 Document Revised: 09/08/2011 Document Reviewed: 10/28/2006  ExitCare® Patient Information ©2015 [employer]. This information is not intended to replace advice given to you by your health care provider. Make sure you discuss any questions you have with your health care provider.

## 2014-07-07 NOTE — Assessment & Plan Note (Signed)
Currently undergoing weight loss program at [facility] center. Compliant with her Phentermine. F/U as instructed.

## 2014-07-07 NOTE — Assessment & Plan Note (Signed)
BP looks good on current regimen. 

## 2014-07-07 NOTE — Assessment & Plan Note (Signed)
Asymptomatic. TSH rechecked. Free T3 and T4 checked as well as TPO antibody. I reviewed [facility]. Plan to refer for biopsy or thyroid uptake scan based on the result. Consider endocrinology referral as well.

## 2014-07-08 LAB — THYROID PEROXIDASE ANTIBODY: THYROID PEROXIDASE ANTIBODY: 1 [IU]/mL (ref <9)

## 2014-07-08 LAB — TSH: TSH: 0.268 u[IU]/mL — AB (ref 0.350–4.500)

## 2014-07-08 LAB — T3, FREE: T3, Free: 3.4 pg/mL (ref 2.3–4.2)

## 2014-07-08 LAB — T4, FREE: Free T4: 1 ng/dL (ref 0.80–1.80)

## 2014-07-10 ENCOUNTER — Encounter: Payer: Self-pay | Admitting: Family Medicine

## 2014-07-10 ENCOUNTER — Telehealth: Payer: Self-pay | Admitting: Family Medicine

## 2014-07-10 DIAGNOSIS — E0590 Thyrotoxicosis, unspecified without thyrotoxic crisis or storm: Secondary | ICD-10-CM

## 2014-07-10 NOTE — Telephone Encounter (Signed)
Message left for her to call back about her Thyroid test.  NB: She seems to have subclinical hyperthyroidism. She will need thyroid uptake scan and treatment.        She already had thyroid biopsy in the past which was benign per patient.        May refer to endocrinologist prior to obtaining thyroid uptake scan.

## 2014-07-10 NOTE — Telephone Encounter (Signed)
I spoke with patient and discussed test result. I will send her to endocrinologist and defer thyroid uptake scan to the endocrinologist.

## 2014-07-21 ENCOUNTER — Ambulatory Visit: Payer: Managed Care, Other (non HMO) | Admitting: Internal Medicine

## 2014-07-24 ENCOUNTER — Telehealth: Payer: Self-pay | Admitting: Family Medicine

## 2014-07-24 NOTE — Telephone Encounter (Signed)
Is at work.  And got dizzy suddenly. Her BP is 139/101. Should she come in?

## 2014-07-24 NOTE — Telephone Encounter (Signed)
If not feeling well please have her come in.

## 2014-07-25 ENCOUNTER — Ambulatory Visit (INDEPENDENT_AMBULATORY_CARE_PROVIDER_SITE_OTHER): Payer: Managed Care, Other (non HMO) | Admitting: Internal Medicine

## 2014-07-25 ENCOUNTER — Encounter: Payer: Self-pay | Admitting: Internal Medicine

## 2014-07-25 ENCOUNTER — Encounter: Payer: Self-pay | Admitting: Family Medicine

## 2014-07-25 ENCOUNTER — Ambulatory Visit (INDEPENDENT_AMBULATORY_CARE_PROVIDER_SITE_OTHER): Payer: Managed Care, Other (non HMO) | Admitting: Family Medicine

## 2014-07-25 VITALS — BP 120/72 | HR 104 | Temp 97.8°F | Resp 12 | Ht 66.0 in | Wt 240.0 lb

## 2014-07-25 VITALS — BP 148/88 | HR 85 | Temp 98.4°F | Ht 66.0 in | Wt 239.0 lb

## 2014-07-25 DIAGNOSIS — R42 Dizziness and giddiness: Secondary | ICD-10-CM

## 2014-07-25 DIAGNOSIS — E049 Nontoxic goiter, unspecified: Secondary | ICD-10-CM

## 2014-07-25 LAB — T3, FREE: T3, Free: 3.2 pg/mL (ref 2.3–4.2)

## 2014-07-25 LAB — T4, FREE: Free T4: 0.77 ng/dL (ref 0.60–1.60)

## 2014-07-25 LAB — TSH: TSH: 0.69 u[IU]/mL (ref 0.35–4.50)

## 2014-07-25 MED ORDER — AMOXICILLIN-POT CLAVULANATE 875-125 MG PO TABS: 1.0000 | ORAL_TABLET | Freq: Two times a day (BID) | ORAL | Status: DC

## 2014-07-25 NOTE — Patient Instructions (Signed)
Please stop at the lab.  Please return in 1 year.  

## 2014-07-25 NOTE — Telephone Encounter (Signed)
Pt has appt this afternoon @ 3:15. Fleeger, Salome Spotted

## 2014-07-25 NOTE — Progress Notes (Signed)
Patient ID: Cynthia Wilson, female   DOB: May 03, 1966, 49 y.o.   MRN: [id number] Subjective:   CC: Dizziness  HPI:   Patient is a 49 y.o. female who presents for sameday appointment due to dizziness. Reports that she felt dizzy a few minutes on 16th while out with granddaughter, like room was spinning. She had associated nausea and frontal throbbing headache. Laid down at home and about 1 hour later she felt totally better. The next day felt  face stuffiness and draining down throat, making her feel like she may have sinus infection now. Has felt hot but denies chills. Has also been sneezing which has resolved. Denies dyspnea. Again, got dizzy suddenly at work yesterday and had frontal 9/10 headache which improved 4-5 hours later after she laid down. Has h/o vertigo, most recently years ago. This felt similar. This episode felt similar also to low blood sugar. However, blood sugar yesterday was 119. Last night it was 136. Denies jittery/diaphoresis sensation. Did not try any medication.  Has been hydrating regularly; urine is clear. No focal weakness, difficulty with speech, gait, or vision. No syncope. No chest pain or dyspnea. Mouth got very dry yesterday when happening.  No abodminal pain or emesis. Also reported some lightheadedness when stands: yes, more dizzy when turns head. Palpitations or heart racing: no Taking blood thinners: no Hearing Loss: no Ear Pain or fullness: no Nausea or vomiting: nausea, no vomiting Vision difficulty or double vision: no Falls: no Head trauma: no Weakness in arm or leg: no Speaking problems: no Recent new meds? no New foods? no New stressors? no Seen today by endocrinology for subclinical thyroitoxicosis and goiter (benign FNA left ).  Checking labs at endocrinology (TSH, fT3 and fT4). Will consider uptake scan to differentiated between graves disease, thyroiditis, and toxic multinodular goiter/toxic adenoma).    ROS see HPI Smoking Status  noted   Review of Systems - Per HPI.   PMH - Left knee OA, goiter, obesity, HTN, allergic rhinitis, GERD, left knee pain, vertigo, polydipsia, DM, heart murmur, adnexal mass, pelvic adhesions, dizziness    Objective:  Physical Exam BP 148/88 mmHg  Pulse 85  Temp(Src) 98.4 F (36.9 C) (Oral)  Ht 5\' 6"  (1.676 m)  Wt 239 lb (108.41 kg)  BMI 38.59 kg/m2  Orthostatics: 136/87 lying>>134/87 seated>>126/87 standing with pulse 80-85 throughout GEN: NAD CV: RRR, no m/r/g PULM: CTAB, normal effort HEENT: AT/[place], sclera clear, EOMI, o/p clear, TMs retracted with clear fluid, no sinus tenderness, neck supple, mild submandibular LAD NEUR: CN 2-12 intact, normal speech and gait, EOMI, UE and LE strength 5/5, FNF and UE and LE rapid alternating movements negative Dix Hallpike negative  Assessment:     Cynthia Wilson is a 49 y.o. female here with dizziness.    Plan:     # See problem list and after visit summary for problem-specific plans.   # Health Maintenance: Not discussed  Follow-up: Follow up in 1-2 week for lack of improvement of sinusitis or dizziness.    Cynthia Sinclair, MD [place]

## 2014-07-25 NOTE — Progress Notes (Signed)
Patient ID: Cynthia Wilson, female   DOB: [birth year], 49 y.o.   MRN: [id number]   HPI  Cynthia Wilson is a 49 y.o.-year-old female, referred by her PCP, Dr. Gwendlyn Deutscher, in consultation for subclinical thyrotoxicosis and goiter.  She has [facility]NG since 2004.  I reviewed pt's thyroid tests: Lab Results  Component Value Date   TSH 0.268* 07/07/2014   TSH 0.523 03/09/2012   FREET4 1.00 07/07/2014  TPO Abs were normal, at 1.  Pt had a thyroid U/S at [facility]. 1 larger nodule at 2.1 cm, the rest smaller.  She had a previous Thyroid scan in 2004:  She had a previous Bx in 2004: 1. NORMAL RIGHT LOBE OF THE THYROID. 2. THREE COLD NODULES OF THE LEFT LOBE. I WOULD RECOMMEND AN ULTRASOUND OF THE THYROID TO DETERMINE IF THESE ARE CYSTIC OR SOLID NODULES.  L Thyroid nodule FNA (05/10/2003): LEFT THYROID NODULE:: SCANT FOLLICULAR EPITHELIUM, COLLOID AND PIGMENTED MACROPHAGES.   Pt denies feeling nodules in neck, hoarseness, dysphagia/odynophagia, SOB with lying down; she denies: - fatigue - excessive sweating/heat intolerance - tremors - anxiety - palpitations - hyperdefecation - weight loss  Pt does not have a FH of thyroid ds. No FH of thyroid cancer. No h/o radiation tx to head or neck.  No seaweed or kelp, no recent contrast studies. No steroid use. No herbal supplements.   I reviewed her chart and she also has a history of HTn, DM2, GERD.  ROS: Constitutional: no weight gain/loss, no fatigue, no subjective hyperthermia/hypothermia Eyes: no blurry vision, no xerophthalmia ENT: no sore throat, no nodules palpated in throat, no dysphagia/odynophagia, no hoarseness Cardiovascular: no CP/SOB/palpitations/leg swelling Respiratory: no cough/SOB Gastrointestinal: no N/V/D/+ C/+ heartburn Musculoskeletal: no muscle/joint aches Skin: no rashes Neurological: no tremors/numbness/tingling/dizziness Psychiatric: no depression/anxiety  Past Medical History  Diagnosis Date  .  GERD (gastroesophageal reflux disease)   . Hypertension     under control with med., has been on med. x 5 yr.  . Non-insulin dependent type 2 diabetes mellitus   . History of osteoarthritis     left knee  . Wears partial dentures     upper  . Immature cataract   . Medial meniscus tear 09/2013    right knee   Past Surgical History  Procedure Laterality Date  . Knee arthroscopy Left 2011  . Total knee arthroplasty Left 02/11/2013    Procedure: LEFT TOTAL KNEE ARTHROPLASTY;  Surgeon: Alta Corning, MD;  Location: [place];  Service: Orthopedics;  Laterality: Left;  NO COMPUTER  . Salpingoophorectomy Left 04/21/2013    Procedure: SALPINGO OOPHORECTOMY;  Surgeon: Lavonia Drafts, MD;  Location: [facility];  Service: Gynecology;  Laterality: Left;  . Laparoscopy N/A 04/21/2013    Procedure: LAPAROSCOPY DIAGNOSTIC;  Surgeon: Lavonia Drafts, MD;  Location: [facility];  Service: Gynecology;  Laterality: N/A;  . Laparotomy N/A 04/21/2013    Procedure: LAPAROTOMY;  Surgeon: Lavonia Drafts, MD;  Location: [facility];  Service: Gynecology;  Laterality: N/A;  . Total abdominal hysterectomy  08/28/2005  . Lysis of adhesion  08/28/2005; 03/25/2007; 04/21/2013  . Unilateral salpingectomy Right 08/28/2005  . Cyst excision  03/25/2007    resection peritoneal inclusion cyst  . Umbilical hernia repair  1984  . 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 Marital Status: Married    Spouse Name: N/A    Number of Children: 4   Occupational History  .  Museum/gallery conservator    Social History Main Topics  . Smoking status: Never Smoker   . Smokeless tobacco: Never Used  . Alcohol Use: No  . Drug Use: No  . Sexual Activity: Yes    Birth Control/ Protection: Surgical    Social History Narrative   Lives with husband and 52 year old son.     Current Outpatient Prescriptions on File Prior to Visit  Medication Sig Dispense Refill  . hydrochlorothiazide (HYDRODIURIL) 25 MG tablet TAKE 1 TABLET (25 MG TOTAL) BY MOUTH DAILY. 30 tablet 4  . loratadine (CLARITIN) 10 MG tablet Take 10 mg by mouth daily as needed for allergies.     . metFORMIN (GLUCOPHAGE-XR) 500 MG 24 hr tablet TAKE 1 TABLET (500 MG TOTAL) BY MOUTH DAILY WITH BREAKFAST. 30 tablet 5  . omeprazole (PRILOSEC) 20 MG capsule TAKE 1 CAPSULE (20 MG TOTAL) BY MOUTH DAILY. FOR HEARTBURN 30 capsule 2  . phentermine 37.5 MG capsule Take 37.5 mg by mouth every morning.     No current facility-administered medications on file prior to visit.   Allergies  Allergen Reactions  . Iohexol Hives        . Other Rash    COBAN   Family History  Problem Relation Age of Onset  . Diabetes Mother   . Hypertension Mother   . Hyperlipidemia Mother   . Emphysema Father    PE: BP 120/72 mmHg  Pulse 104  Temp(Src) 97.8 F (36.6 C) (Oral)  Resp 12  Ht 5\' 6"  (1.676 m)  Wt 240 lb (108.863 kg)  BMI 38.76 kg/m2  SpO2 98% Wt Readings from Last 3 Encounters:  07/25/14 240 lb (108.863 kg)  07/07/14 241 lb (109.317 kg)  05/02/14 242 lb (109.77 kg)   Constitutional: overweight, in NAD Eyes: PERRLA, EOMI, no exophthalmos, no lid lag, no stare ENT: moist mucous membranes, left thyroid fullness, no thyroid bruits, no cervical lymphadenopathy Cardiovascular: RRR, No MRG Respiratory: CTA B Gastrointestinal: abdomen soft, NT, ND, BS+ Musculoskeletal: no deformities, strength intact in all 4 Skin: moist, warm, no rashes Neurological: no tremor with outstretched hands, DTR normal in all 4  ASSESSMENT: 1. Subclinical Thyrotoxicosis  2. Multinodular goiter - 2004 FNA of the left dominant nodule benign  PLAN:  1. Patient with a recently found low TSH, without thyrotoxic sxs: no weight loss, heat intolerance,  hyperdefecation, palpitations, anxiety.  - she does not appear to have exogenous causes for the low TSH.  - We discussed that possible causes of thyrotoxicosis are:  Graves ds   Thyroiditis toxic multinodular goiter/ toxic adenoma (she has multinodular goiter, however the largest nodules in the left lobe or cold per thyroid scan in 2004) - I suggested that we check the TSH, fT3 and fT4 today, to confirm a low TSH - If the tests remain abnormal, we may need an uptake and scan to differentiate between the 3 above possible etiologies  - I do not feel that we need to add beta blockers at this time, since she is not tachycardic, anxious, or tremulous - RTC in 1 year , but likely sooner for repeat labs  2.Multinodular goiter - Patient with a long history of thyroid nodules, status post a thyroid scan in 2004 that showed 3 left thyroid nodules that appeared cold on the scan. She had an FNA of  the dominant nodule in the left lobe, which at that time measures 3.3 cm and appears complex. The biopsy results were benign. I reviewed the report and the biopsy results with the patient today. - Her recent ultrasound performed on 05/29/2014 at [facility] in [facility] (I do not have the images of the ultrasound, but we discussed about the report) shows that her thyroid is actually smaller compared to the size is obtained in 2004. She has several smaller thyroid nodules bilaterally, with the largest one being 2.1 cm, hypervascular, in the left lobe. This appears to correspond to the 3.3 cm nodule seen in the left lobe in 2004. The fact that it was benign at that time and that it decreased in size are indicative of a benign nodule. I did explain that we cannot tell for sure that this is benign and we discussed about either following it clinically and with another ultrasound in 2 years or performing a biopsy now. She opted to follow it clinically and I will see her back in a year. If there are no  compression signs or any indication that the nodule has grown, we'll repeat the thyroid ultrasound in 2 years from now. At that time, I would like her to have it done in [place] [facility] so we can review the images together.   - time spent with the patient: 1 hour, of which >50% was spent in obtaining information about her thyroid status, reviewing her previous labs, evaluations, and imaging reports, counseling her about her conditions (please see the discussed topics above), and developing a plan to further investigate them.   Office Visit on 07/25/2014  Component Date Value Ref Range Status  . TSH 07/25/2014 0.69  0.35 - 4.50 uIU/mL Final  . Free T4 07/25/2014 0.77  0.60 - 1.60 ng/dL Final  . T3, Free 07/25/2014 3.2  2.3 - 4.2 pg/mL Final   Thyroid tests normal, I will repeat them when she returns in a year. She possibly had an episode of very mild thyroiditis, which resolved.

## 2014-07-25 NOTE — Patient Instructions (Signed)
This is likely vertigo from your sinus infection. You likely have a viral sinus infection. Drink plenty of warm fluids and use flonase daily to help open up the nasal passageways. You can also try over the counter afrin for 2-3 days, but do not use longer than this. Take tylenol or ibuprofen as needed for the headache. If not improving with your sinusitis or dizziness in 1-2 weeks, come back for re-evaluation. At that time, we can discuss if vestibular rehabilitation or medication will help with the dizziness. Best,  Hilton Sinclair, MD

## 2014-07-25 NOTE — Assessment & Plan Note (Addendum)
Dizziness with sinusitis symptoms present ~2 weeks. Consider vertigo (given positional nature and hx) worsened by sinusitis. Milder symptom of lightheadedness but negative orthostatic VS in clinic and reports drinking plenty of water. Dix Hallpike negative in clinic. H/o subclinical thyrotoxicosis that is being actively managed by endocrinology. Treating sinusitis should improve vertigo symptoms. Less likely = cerebellar dysfunction, cranial nerve issues, cardiovascular issues given benign exam and no chest pain or dyspnea. - F/u endocrinology workup. - Afrin x 2-3 days, augmentin x 7-10 days for sinusitis >10 days, and recommended using flonase daily. - Warm fluids, rest. Tylenol PRN headache. - F/u in 1-2 week if not improving. At that time, could consider meclizine, vestibular rehab, further cardiovascular workup - Precepted and examined with Dr McDiarmid.

## 2014-09-11 ENCOUNTER — Telehealth: Payer: Self-pay | Admitting: *Deleted

## 2014-09-11 NOTE — Telephone Encounter (Signed)
Pt having pain in her side that radiates to her back. Trying to decide if she needs to see [place] or her PCP.  Advised patient to start with her PCP and if they feel it is gyn related they can refer her back to [place]. She has had a hysterectomy.

## 2014-09-12 ENCOUNTER — Ambulatory Visit (INDEPENDENT_AMBULATORY_CARE_PROVIDER_SITE_OTHER): Payer: Managed Care, Other (non HMO) | Admitting: Family Medicine

## 2014-09-12 ENCOUNTER — Encounter: Payer: Self-pay | Admitting: Family Medicine

## 2014-09-12 VITALS — BP 140/87 | HR 83 | Temp 98.1°F | Ht 66.0 in | Wt 240.0 lb

## 2014-09-12 DIAGNOSIS — R102 Pelvic and perineal pain: Secondary | ICD-10-CM

## 2014-09-12 MED ORDER — TRAMADOL HCL 50 MG PO TABS: 50.0000 mg | ORAL_TABLET | Freq: Three times a day (TID) | ORAL | Status: DC | PRN

## 2014-09-12 NOTE — Assessment & Plan Note (Addendum)
extreme left lower quadrant pain versus pelvic pain of unclear etiology Sounds consistent with possible ovarian cyst versus adhesion related pain Will start workup with labs and pelvic ultrasound. After discussion with her think it's unlikely to be in her GI system or musculoskeletal pain.

## 2014-09-12 NOTE — Patient Instructions (Signed)
Great to meet you!  I am concerned that you are having adhesion related pain or possibly an ovarian cyst again.  If you develop fevers, chills, uncontrolled pain, or cannot tolerate oral food or fluids please seek medical attention quickly.  We will let you know about the results of the tests.   Pelvic Pain Female pelvic pain can be caused by many different things and start from a variety of places. Pelvic pain refers to pain that is located in the lower half of the abdomen and between your hips. The pain may occur over a short period of time (acute) or may be reoccurring (chronic). The cause of pelvic pain may be related to disorders affecting the female reproductive organs (gynecologic), but it may also be related to the bladder, kidney stones, an intestinal complication, or muscle or skeletal problems. Getting help right away for pelvic pain is important, especially if there has been severe, sharp, or a sudden onset of unusual pain. It is also important to get help right away because some types of pelvic pain can be life threatening.  CAUSES  Below are only some of the causes of pelvic pain. The causes of pelvic pain can be in one of several categories.   Gynecologic.  Pelvic inflammatory disease.  Sexually transmitted infection.  Ovarian cyst or a twisted ovarian ligament (ovarian torsion).  Uterine lining that grows outside the uterus (endometriosis).  Fibroids, cysts, or tumors.  Ovulation.  Pregnancy.  Pregnancy that occurs outside the uterus (ectopic pregnancy).  Miscarriage.  Labor.  Abruption of the placenta or ruptured uterus.  Infection.  Uterine infection (endometritis).  Bladder infection.  Diverticulitis.  Miscarriage related to a uterine infection (septic abortion).  Bladder.  Inflammation of the bladder (cystitis).  Kidney stone(s).  Gastrointestinal.  Constipation.  Diverticulitis.  Neurologic.  Trauma.  Feeling pelvic pain because of  mental or emotional causes (psychosomatic).  Cancers of the bowel or pelvis. EVALUATION  Your caregiver will want to take a careful history of your concerns. This includes recent changes in your health, a careful gynecologic history of your periods (menses), and a sexual history. Obtaining your family history and medical history is also important. Your caregiver may suggest a pelvic exam. A pelvic exam will help identify the location and severity of the pain. It also helps in the evaluation of which organ system may be involved. In order to identify the cause of the pelvic pain and be properly treated, your caregiver may order tests. These tests may include:   A pregnancy test.  Pelvic ultrasonography.  An X-ray exam of the abdomen.  A urinalysis or evaluation of vaginal discharge.  Blood tests. HOME CARE INSTRUCTIONS   Only take over-the-counter or prescription medicines for pain, discomfort, or fever as directed by your caregiver.   Rest as directed by your caregiver.   Eat a balanced diet.   Drink enough fluids to make your urine clear or pale yellow, or as directed.   Avoid sexual intercourse if it causes pain.   Apply warm or cold compresses to the lower abdomen depending on which one helps the pain.   Avoid stressful situations.   Keep a journal of your pelvic pain. Write down when it started, where the pain is located, and if there are things that seem to be associated with the pain, such as food or your menstrual cycle.  Follow up with your caregiver as directed.  SEEK MEDICAL CARE IF:  Your medicine does not help your pain.  You have abnormal vaginal discharge. SEEK IMMEDIATE MEDICAL CARE IF:   You have heavy bleeding from the vagina.   Your pelvic pain increases.   You feel light-headed or faint.   You have chills.   You have pain with urination or blood in your urine.   You have uncontrolled diarrhea or vomiting.   You have a fever or  persistent symptoms for more than 3 days.  You have a fever and your symptoms suddenly get worse.   You are being physically or sexually abused.  MAKE SURE YOU:  Understand these instructions.  Will watch your condition.  Will get help if you are not doing well or get worse. Document Released: 05/13/2004 Document Revised: 10/31/2013 Document Reviewed: 10/06/2011 [facility] Patient Information 2015 [place], [place]. This information is not intended to replace advice given to you by your health care provider. Make sure you discuss any questions you have with your health care provider.

## 2014-09-12 NOTE — Progress Notes (Signed)
Patient ID: Cynthia Wilson, female   DOB: 29-Sep-1965, 49 y.o.   MRN: [id number]   HPI  Patient presents today for left lower quadrant pain and back pain.  Patient explains that last Wednesday, 6 days ago, she developed crampy left lower quadrant pain/pelvic pain which rates a 5 out of 10 in intensity. She states that the pain then began to radiate to her back over the last couple of days spurring her to come to the clinic. She states that the day after it started she had a small amount of vaginal spotting on the toilet paper after using the restroom.  She is a history of partial hysterectomy removing her uterus and right ovary. She also has a history of left ovarian cyst removal.  She states that this is a similar pain that she had developed 2 years ago and she had a large ovarian cyst removed. She denies fever, chills, change in oral intake, nausea, vomiting, or diarrhea. She is stooling more regularly than usual but only at one stool per day.  She states that since her hysterectomy she's continued to have PMS symptoms on a regular schedule but that this is similar but more severe and more prolonged than usual. She also usually does not have any back pain with these symptoms.  Smoking status noted ROS: Per HPI  Objective: BP 140/87 mmHg  Pulse 83  Temp(Src) 98.1 F (36.7 C) (Oral)  Ht 5\' 6"  (1.676 m)  Wt 240 lb (108.863 kg)  BMI 38.76 kg/m2 Gen: NAD, alert, cooperative with exam HEENT: NCAT CV: RRR, prominent S2 but no audible murmur Resp: CTABL, no wheezes, non-labored Abd: Soft, nontender to palpation throughout except for an extreme left lower quadrant/pelvis, positive BS Ext: No edema, warm Neuro: Alert and oriented, No gross deficits Musculoskeletal: Full range of motion with left hip with no reproduction of pain  GU: Normal-appearing perineum, vaginal walls pink and well rugated, no abnormalities are bleeding seen in the vagina, cervix surgically absent Bimanual exam with  tenderness to palpation of the left lower quadrant, no fullness  Assessment and plan:  Pelvic pain in female extreme left lower quadrant pain versus pelvic pain of unclear etiology Sounds consistent with possible ovarian cyst versus adhesion related pain Will start workup with labs and pelvic ultrasound. After discussion with her think it's unlikely to be in her GI system or musculoskeletal pain.      Orders Placed This Encounter  Procedures  . US Pelvis Complete    Standing Status: Future     Number of Occurrences:      Standing Expiration Date: 11/12/2015    Order Specific Question:  Reason for Exam (SYMPTOM  OR DIAGNOSIS REQUIRED)    Answer:  Left sided pelvic pain    Order Specific Question:  Preferred imaging location?    Answer:  [facility] [place]  . Comprehensive metabolic panel  . CBC with Differential    Meds ordered this encounter  Medications  . traMADol (ULTRAM) 50 MG tablet    Sig: Take 1 tablet (50 mg total) by mouth every 8 (eight) hours as needed.    Dispense:  45 tablet    Refill:  0

## 2014-09-13 LAB — CBC WITH DIFFERENTIAL/PLATELET
BASOS ABS: 0 10*3/uL (ref 0.0–0.1)
BASOS PCT: 0 % (ref 0–1)
EOS ABS: 0.2 10*3/uL (ref 0.0–0.7)
Eosinophils Relative: 3 % (ref 0–5)
HEMATOCRIT: 36.8 % (ref 36.0–46.0)
Hemoglobin: 11.9 g/dL — ABNORMAL LOW (ref 12.0–15.0)
Lymphocytes Relative: 49 % — ABNORMAL HIGH (ref 12–46)
Lymphs Abs: 2.7 10*3/uL (ref 0.7–4.0)
MCH: 25.2 pg — ABNORMAL LOW (ref 26.0–34.0)
MCHC: 32.3 g/dL (ref 30.0–36.0)
MCV: 77.8 fL — ABNORMAL LOW (ref 78.0–100.0)
MONOS PCT: 11 % (ref 3–12)
MPV: 8.7 fL (ref 8.6–12.4)
Monocytes Absolute: 0.6 10*3/uL (ref 0.1–1.0)
NEUTROS ABS: 2 10*3/uL (ref 1.7–7.7)
NEUTROS PCT: 37 % — AB (ref 43–77)
PLATELETS: 383 10*3/uL (ref 150–400)
RBC: 4.73 MIL/uL (ref 3.87–5.11)
RDW: 14.6 % (ref 11.5–15.5)
WBC: 5.5 10*3/uL (ref 4.0–10.5)

## 2014-09-13 LAB — COMPREHENSIVE METABOLIC PANEL
ALBUMIN: 3.8 g/dL (ref 3.5–5.2)
ALT: 13 U/L (ref 0–35)
AST: 18 U/L (ref 0–37)
Alkaline Phosphatase: 58 U/L (ref 39–117)
BUN: 11 mg/dL (ref 6–23)
CO2: 30 meq/L (ref 19–32)
Calcium: 8.8 mg/dL (ref 8.4–10.5)
Chloride: 99 mEq/L (ref 96–112)
Creat: 0.77 mg/dL (ref 0.50–1.10)
Glucose, Bld: 93 mg/dL (ref 70–99)
Potassium: 3.6 mEq/L (ref 3.5–5.3)
SODIUM: 138 meq/L (ref 135–145)
Total Bilirubin: 0.4 mg/dL (ref 0.2–1.2)
Total Protein: 6.4 g/dL (ref 6.0–8.3)

## 2014-09-14 ENCOUNTER — Other Ambulatory Visit: Payer: Self-pay | Admitting: Family Medicine

## 2014-09-14 ENCOUNTER — Ambulatory Visit (HOSPITAL_COMMUNITY)
Admission: RE | Admit: 2014-09-14 | Discharge: 2014-09-14 | Disposition: A | Discharge status: Home / Self Care | Payer: Managed Care, Other (non HMO) | Source: Ambulatory Visit | Attending: Family Medicine | Admitting: Family Medicine

## 2014-09-14 DIAGNOSIS — I10 Essential (primary) hypertension: Secondary | ICD-10-CM | POA: Insufficient documentation

## 2014-09-14 DIAGNOSIS — E119 Type 2 diabetes mellitus without complications: Secondary | ICD-10-CM | POA: Insufficient documentation

## 2014-09-14 DIAGNOSIS — N949 Unspecified condition associated with female genital organs and menstrual cycle: Secondary | ICD-10-CM | POA: Insufficient documentation

## 2014-09-14 DIAGNOSIS — R1032 Left lower quadrant pain: Secondary | ICD-10-CM | POA: Diagnosis present

## 2014-09-14 DIAGNOSIS — R102 Pelvic and perineal pain: Secondary | ICD-10-CM

## 2014-09-15 ENCOUNTER — Ambulatory Visit (HOSPITAL_COMMUNITY): Payer: Managed Care, Other (non HMO)

## 2014-09-15 ENCOUNTER — Telehealth: Payer: Self-pay | Admitting: Family Medicine

## 2014-09-15 DIAGNOSIS — R102 Pelvic and perineal pain: Secondary | ICD-10-CM

## 2014-09-15 NOTE — Telephone Encounter (Signed)
I called to discuss the patient's recent findings.  She has mild microcytic anemia which is likely iron deficiency anemia.  The rest of her labs were unremarkable  Her ultrasound of her pelvis had surgically absent uterus and ovaries which were no surprise, but did not show any acute findings.  Considering her pain and its lightness to her previous pain associated with ovarian cyst, but had concern for pelvic adhesions at that time, I will refer her back to OB/GYN to see if they believe this has anything to do with pelvic adhesions and if there is any surgical options for her pain.

## 2014-10-03 ENCOUNTER — Other Ambulatory Visit: Payer: Self-pay | Admitting: Family Medicine

## 2014-10-13 ENCOUNTER — Other Ambulatory Visit: Payer: Self-pay

## 2014-10-13 DIAGNOSIS — Z1231 Encounter for screening mammogram for malignant neoplasm of breast: Secondary | ICD-10-CM

## 2014-11-08 ENCOUNTER — Emergency Department (INDEPENDENT_AMBULATORY_CARE_PROVIDER_SITE_OTHER)
Admission: EM | Admit: 2014-11-08 | Discharge: 2014-11-08 | Disposition: A | Discharge status: Home / Self Care | Payer: Managed Care, Other (non HMO) | Source: Home / Self Care | Attending: Emergency Medicine | Admitting: Emergency Medicine

## 2014-11-08 ENCOUNTER — Encounter (HOSPITAL_COMMUNITY): Payer: Self-pay | Admitting: Emergency Medicine

## 2014-11-08 DIAGNOSIS — H9202 Otalgia, left ear: Secondary | ICD-10-CM

## 2014-11-08 DIAGNOSIS — J329 Chronic sinusitis, unspecified: Secondary | ICD-10-CM

## 2014-11-08 DIAGNOSIS — R0982 Postnasal drip: Secondary | ICD-10-CM

## 2014-11-08 MED ORDER — IPRATROPIUM BROMIDE 0.06 % NA SOLN: 2.0000 | Freq: Four times a day (QID) | NASAL | Status: DC

## 2014-11-08 NOTE — ED Provider Notes (Signed)
CSN: [id number]     Arrival date & time 11/08/14  1751 History   First MD Initiated Contact with Patient 11/08/14 1820     Chief Complaint  Patient presents with  . Otalgia   (Consider location/radiation/quality/duration/timing/severity/associated sxs/prior Treatment) Patient is a 49 y.o. female presenting with ear pain. The history is provided by the patient.  Otalgia Location:  Left Quality:  Sore Severity:  Mild Onset quality:  Gradual Duration:  3 days Chronicity:  New Associated symptoms: congestion and rhinorrhea   Associated symptoms: no fever, no headaches, no hearing loss and no sore throat   Associated symptoms comment:  Post nasal drainage Risk factors: no chronic ear infection     Past Medical History  Diagnosis Date  . GERD (gastroesophageal reflux disease)   . Hypertension     under control with med., has been on med. x 5 yr.  . Non-insulin dependent type 2 diabetes mellitus   . History of osteoarthritis     left knee  . Wears partial dentures     upper  . Immature cataract   . Medial meniscus tear 09/2013    right knee   Past Surgical History  Procedure Laterality Date  . Knee arthroscopy Left 2011  . Total knee arthroplasty Left 02/11/2013    Procedure: LEFT TOTAL KNEE ARTHROPLASTY;  Surgeon: Alta Corning, MD;  Location: [facility];  Service: Orthopedics;  Laterality: Left;  NO COMPUTER  . Salpingoophorectomy Left 04/21/2013    Procedure: SALPINGO OOPHORECTOMY;  Surgeon: Lavonia Drafts, MD;  Location: [facility];  Service: Gynecology;  Laterality: Left;  . Laparoscopy N/A 04/21/2013    Procedure: LAPAROSCOPY DIAGNOSTIC;  Surgeon: Lavonia Drafts, MD;  Location: [facility];  Service: Gynecology;  Laterality: N/A;  . Laparotomy N/A 04/21/2013    Procedure: LAPAROTOMY;  Surgeon: Lavonia Drafts, MD;  Location: [facility];  Service: Gynecology;  Laterality: N/A;  . Total abdominal hysterectomy  08/28/2005  . Lysis of adhesion  08/28/2005; 03/25/2007;  04/21/2013  . Unilateral salpingectomy Right 08/28/2005  . Cyst excision  03/25/2007    resection peritoneal inclusion cyst  . Umbilical hernia repair  1984  . 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 Diabetes Mother   . Hypertension Mother   . Hyperlipidemia Mother   . Emphysema Father    History  Substance Use Topics  . Smoking status: Never Smoker   . Smokeless tobacco: Never Used  . Alcohol Use: No   OB History    Gravida Para Term Preterm AB TAB SAB Ectopic Multiple Living   6 4 4  2  2   4      Review of Systems  Constitutional: Negative for fever and chills.  HENT: Positive for congestion, ear pain, postnasal drip, rhinorrhea and sinus pressure. Negative for dental problem, hearing loss, sore throat and trouble swallowing.   Eyes: Negative.   Respiratory: Negative.   Cardiovascular: Negative.   Skin: Negative.   Neurological: Negative for dizziness, light-headedness and headaches.    Allergies  Iohexol and Other  Home Medications   Prior to Admission medications   Medication Sig Start Date End Date Taking? Authorizing Provider  amoxicillin-clavulanate (AUGMENTIN) 875-125 MG per tablet Take 1 tablet by mouth 2 (two) times daily.  7-10 days 07/25/14   Hilton Sinclair, MD  hydrochlorothiazide (HYDRODIURIL) 25 MG tablet TAKE 1 TABLET (25 MG TOTAL) BY MOUTH DAILY. 06/29/14   Kinnie Feil, MD  ipratropium (ATROVENT) 0.06 % nasal spray Place 2 sprays into both nostrils 4 (four) times daily. 11/08/14   Audelia Hives Kemia Wendel, PA  loratadine (CLARITIN) 10 MG tablet Take 10 mg by mouth daily as needed for allergies.     Historical Provider, MD  metFORMIN (GLUCOPHAGE-XR) 500 MG 24 hr tablet TAKE 1 TABLET (500 MG  TOTAL) BY MOUTH DAILY WITH BREAKFAST. 06/05/14   Kinnie Feil, MD  omeprazole (PRILOSEC) 20 MG capsule TAKE 1 CAPSULE (20 MG TOTAL) BY MOUTH DAILY. FOR HEARTBURN 10/04/14   Kinnie Feil, MD  phentermine 37.5 MG capsule Take 37.5 mg by mouth every morning.    Historical Provider, MD  traMADol (ULTRAM) 50 MG tablet Take 1 tablet (50 mg total) by mouth every 8 (eight) hours as needed. 09/12/14   Timmothy Euler, MD   BP 117/87 mmHg  Pulse 76  Temp(Src) 98.3 F (36.8 C) (Oral)  Resp 18  SpO2 99% Physical Exam  Constitutional: She is oriented to person, place, and time. She appears well-developed and well-nourished. No distress.  HENT:  Head: Normocephalic and atraumatic.  Right Ear: Hearing, tympanic membrane, external ear and ear canal normal.  Left Ear: Hearing, tympanic membrane, external ear and ear canal normal. No drainage, swelling or tenderness. No mastoid tenderness. Tympanic membrane is not injected, not perforated, not erythematous, not retracted and not bulging. Tympanic membrane mobility is normal.  No middle ear effusion. No hemotympanum. No decreased hearing is noted.  Ears:  Outlined area is area of reported discomfort. This area on exam today is without significant clinical finding or abnormality. No pain reported or reproduced during exam.   Eyes: Conjunctivae are normal.  Neck: Normal range of motion. Neck supple.  Cardiovascular: Normal rate, regular rhythm and normal heart sounds.   Pulmonary/Chest: Effort normal and breath sounds normal.  Musculoskeletal: Normal range of motion.  Lymphadenopathy:    She has no cervical adenopathy.  Neurological: She is alert and oriented to person, place, and time.  Skin: Skin is warm and dry.  Psychiatric: She has a normal mood and affect. Her behavior is normal.  Nursing note and vitals reviewed.   ED Course  Procedures (including critical care time) Labs Review Labs Reviewed - No data to display  Imaging Review No  results found.   MDM   1. Otalgia of left ear   2. Post-nasal drainage   Exam and vital signs unremarkable. Atrovent nasal spray as directed Follow up PCP if no improvement   Lutricia Feil, [place] 11/08/14 1850

## 2014-11-08 NOTE — Discharge Instructions (Signed)
Otalgia  The most common reason for this in children is an infection of the middle ear. Pain from the middle ear is usually caused by a build-up of fluid and pressure behind the eardrum. Pain from an earache can be sharp, dull, or burning. The pain may be temporary or constant. The middle ear is connected to the nasal passages by a short narrow tube called the Eustachian tube. The Eustachian tube allows fluid to drain out of the middle ear, and helps keep the pressure in your ear equalized.  CAUSES   A cold or allergy can block the Eustachian tube with inflammation and the build-up of secretions. This is especially likely in small children, because their Eustachian tube is shorter and more horizontal. When the Eustachian tube closes, the normal flow of fluid from the middle ear is stopped. Fluid can accumulate and cause stuffiness, pain, hearing loss, and an ear infection if germs start growing in this area.  SYMPTOMS   The symptoms of an ear infection may include fever, ear pain, fussiness, increased crying, and irritability. Many children will have temporary and minor hearing loss during and right after an ear infection. Permanent hearing loss is rare, but the risk increases the more infections a child has. Other causes of ear pain include retained water in the outer ear canal from swimming and bathing.  Ear pain in adults is less likely to be from an ear infection. Ear pain may be referred from other locations. Referred pain may be from the joint between your jaw and the skull. It may also come from a tooth problem or problems in the neck. Other causes of ear pain include:   A foreign body in the ear.   Outer ear infection.   Sinus infections.   Impacted ear wax.   Ear injury.   Arthritis of the jaw or TMJ problems.   Middle ear infection.   Tooth infections.   Sore throat with pain to the ears.  DIAGNOSIS   Your caregiver can usually make the diagnosis by examining you. Sometimes other special studies,  including x-rays and lab work may be necessary.  TREATMENT    If antibiotics were prescribed, use them as directed and finish them even if you or your child's symptoms seem to be improved.   Sometimes PE tubes are needed in children. These are little plastic tubes which are put into the eardrum during a simple surgical procedure. They allow fluid to drain easier and allow the pressure in the middle ear to equalize. This helps relieve the ear pain caused by pressure changes.  HOME CARE INSTRUCTIONS    Only take over-the-counter or prescription medicines for pain, discomfort, or fever as directed by your caregiver. DO NOT GIVE CHILDREN ASPIRIN because of the association of Reye's Syndrome in children taking aspirin.   Use a cold pack applied to the outer ear for 15-20 minutes, 03-04 times per day or as needed may reduce pain. Do not apply ice directly to the skin. You may cause frost bite.   Over-the-counter ear drops used as directed may be effective. Your caregiver may sometimes prescribe ear drops.   Resting in an upright position may help reduce pressure in the middle ear and relieve pain.   Ear pain caused by rapidly descending from high altitudes can be relieved by swallowing or chewing gum. Allowing infants to suck on a bottle during airplane travel can help.   Do not smoke in the house or near children. If you are   unable to quit smoking, smoke outside.   Control allergies.  SEEK IMMEDIATE MEDICAL CARE IF:    You or your child are becoming sicker.   Pain or fever relief is not obtained with medicine.   You or your child's symptoms (pain, fever, or irritability) do not improve within 24 to 48 hours or as instructed.   Severe pain suddenly stops hurting. This may indicate a ruptured eardrum.   You or your children develop new problems such as severe headaches, stiff neck, difficulty swallowing, or swelling of the face or around the ear.  Document Released: 02/01/2004 Document Revised: 09/08/2011  Document Reviewed: 06/07/2008  Exit[employer] Patient Information 2015 [employer]. This information is not intended to replace advice given to you by your health care provider. Make sure you discuss any questions you have with your health care provider.

## 2014-11-08 NOTE — ED Notes (Signed)
Pt states that her left ear has been hurting for 3 days along with post nasal drip

## 2014-11-15 ENCOUNTER — Encounter: Payer: Self-pay | Admitting: Obstetrics & Gynecology

## 2014-11-15 ENCOUNTER — Ambulatory Visit (INDEPENDENT_AMBULATORY_CARE_PROVIDER_SITE_OTHER): Payer: Managed Care, Other (non HMO) | Admitting: Obstetrics & Gynecology

## 2014-11-15 VITALS — BP 141/87 | HR 84 | Ht 66.0 in | Wt 245.3 lb

## 2014-11-15 DIAGNOSIS — R1030 Lower abdominal pain, unspecified: Secondary | ICD-10-CM | POA: Diagnosis not present

## 2014-11-15 NOTE — Patient Instructions (Signed)

## 2014-11-15 NOTE — Progress Notes (Signed)
Subjective:     Patient ID: Cynthia Wilson, female   DOB: 04-03-1966, 49 y.o.   MRN: [id number]  HPI Pt reports that in  Mar she had severe abd/pelvic pain.  She reports that it has been getting better but, occurs ~3x/day.  She has been taking Motrin for pain she feels that it helps at night.  She reports that the pain is worse with walking. She has been taking exercise classes and does NOT report worsening pain with working out on the [employer] or other classes.  She reports that the pain is the same pain that she had prev when she had pain in her ovary.  She reports that she is having 1-2 stools per day.  The area of her abd that she points to is to the left of the midline above the mons pubis.        Review of Systems     Objective:   Physical Exam BP 141/87 mmHg  Pulse 84  Ht 5\' 6"  (1.676 m)  Wt 245 lb 4.8 oz (111.267 kg)  BMI 39.61 kg/m2 Pt in NAD Lungs: CTA CV: RRR Abd: soft, ND, there is point tenderness with extending the abd. GU: EGBUS: no lesions Vagina: no blood in vault; no masses Adnexa: no masses; non tender         09/14/2014 CLINICAL DATA: LEFT-sided pelvic pain, LEFT lower quadrant pain for 1 week, history type 2 diabetes, hypertension  EXAM: TRANSABDOMINAL AND TRANSVAGINAL ULTRASOUND OF PELVIS  TECHNIQUE: Both transabdominal and transvaginal ultrasound examinations of the pelvis were performed. Transabdominal technique was performed for global imaging of the pelvis including uterus, ovaries, adnexal regions, and pelvic cul-de-sac. It was necessary to proceed with endovaginal exam following the transabdominal exam to visualize the endometrium.  COMPARISON: None  FINDINGS: Uterus  Surgically absent  Endometrium  N/A  Right ovary  Surgically absent  Left ovary  Measurements: 2.0 x 1.4 x 1.1 cm. Normal morphology without mass or cyst. Blood flow present within LEFT ovary on color Doppler imaging.  Other  findings  No free fluid or adnexal masses.  IMPRESSION: Surgical absence of uterus and RIGHT ovary.  Normal LEFT ovary.  No acute abnormalities.   Electronically Signed  By: Lavonia Dana M.D.  On: 09/14/2014 16:57           Vitals     Height Weight BMI (Calculated)    5\' 6"  (1.676 m) 240 lb (108.863 kg) 38.8      Interpretation Summary     CLINICAL DATA: LEFT-sided pelvic pain, LEFT lower quadrant pain for 1 week, history type 2 diabetes, hypertension  EXAM: TRANSABDOMINAL AND TRANSVAGINAL ULTRASOUND OF PELVIS  TECHNIQUE: Both transabdominal and transvaginal ultrasound examinations of the pelvis were performed. Transabdominal technique was performed for global imaging of the pelvis including uterus, ovaries, adnexal regions, and pelvic cul-de-sac. It was necessary to proceed with endovaginal exam following the transabdominal exam to visualize the endometrium.  COMPARISON: None  FINDINGS: Uterus  Surgically absent  Endometrium  N/A  Right ovary  Surgically absent  Left ovary  Measurements: 2.0 x 1.4 x 1.1 cm. Normal morphology without mass or cyst. Blood flow present within LEFT ovary on color Doppler imaging.  Other findings  No free fluid or adnexal masses.  IMPRESSION: Surgical absence of uterus and RIGHT ovary.  Normal LEFT ovary.  No acute abnormalities.        Assessment:     abd pain - suspect [place]     Plan:  Motrin 800mg  q 8 hours prn [place] to affected area prn F/u via telephone in 1 month with progress

## 2014-11-16 ENCOUNTER — Encounter: Payer: Self-pay | Admitting: Gastroenterology

## 2014-11-24 ENCOUNTER — Ambulatory Visit
Admission: RE | Admit: 2014-11-24 | Discharge: 2014-11-24 | Disposition: A | Discharge status: Home / Self Care | Payer: Managed Care, Other (non HMO) | Source: Ambulatory Visit

## 2014-11-24 DIAGNOSIS — Z1231 Encounter for screening mammogram for malignant neoplasm of breast: Secondary | ICD-10-CM

## 2014-11-28 ENCOUNTER — Encounter: Payer: Self-pay | Admitting: Family Medicine

## 2014-12-17 ENCOUNTER — Other Ambulatory Visit: Payer: Self-pay | Admitting: Family Medicine

## 2014-12-18 ENCOUNTER — Ambulatory Visit (INDEPENDENT_AMBULATORY_CARE_PROVIDER_SITE_OTHER): Payer: Managed Care, Other (non HMO) | Admitting: Family Medicine

## 2014-12-18 ENCOUNTER — Encounter: Payer: Self-pay | Admitting: Family Medicine

## 2014-12-18 VITALS — BP 142/79 | HR 94 | Temp 98.7°F | Wt 254.3 lb

## 2014-12-18 DIAGNOSIS — M545 Low back pain: Secondary | ICD-10-CM

## 2014-12-18 MED ORDER — MELOXICAM 15 MG PO TABS: 15.0000 mg | ORAL_TABLET | Freq: Every day | ORAL | Status: DC

## 2014-12-18 MED ORDER — CYCLOBENZAPRINE HCL 5 MG PO TABS: 5.0000 mg | ORAL_TABLET | Freq: Three times a day (TID) | ORAL | Status: DC | PRN

## 2014-12-18 NOTE — Patient Instructions (Signed)
For your back: -flexeril 5mg  three times a day as needed (muscle relaxer) - use caution because this might make you sleepy -meloxicam 7.5mg  daily for 7 days, then daily as needed - no ibuprofen, aleve, etc while on this medicine -get xrays -Try ice or heating pad -See exercises below  Follow up with Dr. Gwendlyn Deutscher in 3-4 weeks  Be well, Dr. Ardelia Mems

## 2014-12-18 NOTE — Progress Notes (Signed)
Patient ID: Cynthia Wilson, female   DOB: 1965/12/26, 49 y.o.   MRN: [id number]  HPI:  Pt presents for a same day appointment to discuss back pain.  Pain located in L lower lumbar region. Painful with sitting or standing long periods of time. Denies having fever, saddle anesthesia, lower extremity weakness, or problems with stooling or urination. Ibuprofen 800mg  has helped pain and helped her sleep. Pain 3 mos in duration, since March 2016. Was seen here at [facility] and also at [facility] clinic and deemed to be back-related rather than pelvic organ related per patient. No injury to the back.   Also has hx of wetness in ears while laying down. Uses mucinex and claritin. Wants me to look in ear canals.  ROS: See HPI  Roberts: hx DM, GERD, HTN  PHYSICAL EXAM: BP 142/79 mmHg  Pulse 94  Temp(Src) 98.7 F (37.1 C)  Wt 254 lb 4.8 oz (115.35 kg) Gen: NAD HEENT: NCAT. bilat TM's normal without any abnormality to ear canal Back: L posterior paraspinous muscles TTP, some potential midline tenderness as well. Full strength bilat legs (can walk on toes, heels, perform deep squat).  ASSESSMENT/PLAN:  1. Low back pain: likely MSK in origin but with duration, warrants at least plain films to evaluate. - complete lumbar xray - trial of mobic & flexeril for pain relief - given handout on exercises for low back - ice or heat PRN - f/u with PCP in 3-4 weeks for further eval. May benefit from formal physical therapy or MRI if xrays unrevealing.  2. Ear canals -  normal in appearance. F/u with PCP as this was not the focus of our visit today.  FOLLOW UP: F/u in 3-4 weeks for back pain  [place] J. Ardelia Mems, [place]

## 2014-12-19 ENCOUNTER — Ambulatory Visit
Admission: RE | Admit: 2014-12-19 | Discharge: 2014-12-19 | Disposition: A | Discharge status: Home / Self Care | Payer: Managed Care, Other (non HMO) | Source: Ambulatory Visit | Attending: Family Medicine | Admitting: Family Medicine

## 2014-12-19 DIAGNOSIS — M545 Low back pain: Secondary | ICD-10-CM

## 2014-12-20 ENCOUNTER — Encounter: Payer: Self-pay | Admitting: Family Medicine

## 2014-12-21 ENCOUNTER — Telehealth: Payer: Self-pay | Admitting: Family Medicine

## 2014-12-21 NOTE — Telephone Encounter (Signed)
Will forward to Dr. Ardelia Mems who saw patient for this problem. Cynthia Wilson,CMA

## 2014-12-21 NOTE — Telephone Encounter (Signed)
Pt is calling because she had xrays done on Tuesday and would like to know the results if they are in. Thank you, Fonda Kinder, ASA

## 2014-12-22 NOTE — Telephone Encounter (Signed)
Returned call to patient and explained results - xrays showed arthritis but no cancers or any bad findings. Recommend she f/u with PCP to discuss what to do further. Pt appreciative of call.  Leeanne Rio, MD

## 2014-12-29 ENCOUNTER — Encounter: Payer: Self-pay | Admitting: Family Medicine

## 2014-12-29 ENCOUNTER — Ambulatory Visit (INDEPENDENT_AMBULATORY_CARE_PROVIDER_SITE_OTHER): Payer: Managed Care, Other (non HMO) | Admitting: Family Medicine

## 2014-12-29 VITALS — BP 137/77 | HR 77 | Temp 99.1°F | Ht 66.0 in | Wt 251.4 lb

## 2014-12-29 DIAGNOSIS — I10 Essential (primary) hypertension: Secondary | ICD-10-CM | POA: Diagnosis not present

## 2014-12-29 DIAGNOSIS — M47816 Spondylosis without myelopathy or radiculopathy, lumbar region: Secondary | ICD-10-CM | POA: Insufficient documentation

## 2014-12-29 DIAGNOSIS — E119 Type 2 diabetes mellitus without complications: Secondary | ICD-10-CM | POA: Diagnosis not present

## 2014-12-29 LAB — POCT GLYCOSYLATED HEMOGLOBIN (HGB A1C): HEMOGLOBIN A1C: 6

## 2014-12-29 NOTE — Progress Notes (Signed)
Subjective:     Patient ID: Cynthia Wilson, female   DOB: 20-Jun-1966, 49 y.o.   MRN: [id number]  HPI  Back pain: here to follow up with her back pain which she has for the last 4-6 months. Pain is worse with prolonged sitting and prolonged standing, no previous back injury. Pain is about 4/10 in severity today, but yesterday evening it was about 8-10/10 in severity. She had an xray recently and she is here to follow up with result. DM2: Still on Metformin 500mg  qd, she is asking if she still need to be on this medication. She has been exercising and watching her diet. Denies any other concern. NLG:XQJJHERDE with HCTZ, denies any hypotensive episodes.  Current Outpatient Prescriptions on File Prior to Visit  Medication Sig Dispense Refill  . cyclobenzaprine (FLEXERIL) 5 MG tablet Take 1 tablet (5 mg total) by mouth 3 (three) times daily as needed for muscle spasms. 30 tablet 1  . hydrochlorothiazide (HYDRODIURIL) 25 MG tablet TAKE 1 TABLET (25 MG TOTAL) BY MOUTH DAILY. 30 tablet 4  . loratadine (CLARITIN) 10 MG tablet Take 10 mg by mouth daily as needed for allergies.     . meloxicam (MOBIC) 15 MG tablet Take 1 tablet (15 mg total) by mouth daily. x 7 days then daily as needed 30 tablet 0  . metFORMIN (GLUCOPHAGE-XR) 500 MG 24 hr tablet TAKE 1 TABLET (500 MG TOTAL) BY MOUTH DAILY WITH BREAKFAST. 30 tablet 5  . omeprazole (PRILOSEC) 20 MG capsule TAKE 1 CAPSULE (20 MG TOTAL) BY MOUTH DAILY. FOR HEARTBURN (Patient not taking: Reported on 12/29/2014) 30 capsule 2   No current facility-administered medications on file prior to visit.   Past Medical History  Diagnosis Date  . GERD (gastroesophageal reflux disease)   . Hypertension     under control with med., has been on med. x 5 yr.  . Non-insulin dependent type 2 diabetes mellitus   . History of osteoarthritis     left knee  . Wears partial dentures     upper  . Immature cataract   . Medial meniscus tear 09/2013    right knee       Review of Systems  Respiratory: Negative.   Cardiovascular: Negative.   Gastrointestinal: Negative.   Musculoskeletal: Positive for back pain.  All other systems reviewed and are negative.      Filed Vitals:   12/29/14 0920  BP: 137/77  Pulse: 77  Temp: 99.1 F (37.3 C)  TempSrc: Oral  Height: 5\' 6"  (1.676 m)  Weight: 251 lb 6 oz (114.023 kg)    Objective:   Physical Exam  Constitutional: She appears well-developed. No distress.  Cardiovascular: Normal rate, regular rhythm, normal heart sounds and intact distal pulses.   No murmur heard. Pulmonary/Chest: Effort normal and breath sounds normal. No respiratory distress. She has no wheezes.  Abdominal: Soft. Bowel sounds are normal. She exhibits no distension and no mass. There is no tenderness.  Musculoskeletal: Normal range of motion. She exhibits no edema.       Lumbar back: Normal.  Neurological: She has normal strength. No cranial nerve deficit or sensory deficit.  Nursing note and vitals reviewed.      Assessment:     Lumbar DJD DM2 HTN     Plan:     Check problem list.

## 2014-12-29 NOTE — Patient Instructions (Signed)
It was nice seeing you today, I am sorry about your back pain. As discussed with you, you do have arthritis in your back causing the pain. Please continue Mobic and I will refer you to physical therapy to help improve your range of motion and reduce pain. Call us soon if there is no improvement with treatment in the next 4 wks.

## 2014-12-29 NOTE — Assessment & Plan Note (Addendum)
A1C checked today. Previous A1C had been good since 2014. If repeat test is fine, I will contact her about D/C Metformin. She agreed with plan.  NB: A1C= 6.0 slight increase from last.                  I called patient and discussed result with her.                  I recommended talking half of her 500 mg Metformin daily for the next 3 months.                  If A1C remains low on this dose we will get her off med altogether.                  She agreed with plan.

## 2014-12-29 NOTE — Assessment & Plan Note (Signed)
Xray reviewed and discussed with patient. Continue Mobic. PT referral done. F/U prn.

## 2014-12-29 NOTE — Assessment & Plan Note (Signed)
Medication review done. BP looks good on HCTZ 25 mg qd. Continue current dose.

## 2015-01-22 ENCOUNTER — Ambulatory Visit: Payer: Managed Care, Other (non HMO) | Attending: Family Medicine | Admitting: Physical Therapy

## 2015-01-22 DIAGNOSIS — M545 Low back pain, unspecified: Secondary | ICD-10-CM

## 2015-01-22 DIAGNOSIS — M533 Sacrococcygeal disorders, not elsewhere classified: Secondary | ICD-10-CM | POA: Diagnosis present

## 2015-01-22 DIAGNOSIS — R293 Abnormal posture: Secondary | ICD-10-CM | POA: Diagnosis not present

## 2015-01-22 DIAGNOSIS — M47896 Other spondylosis, lumbar region: Secondary | ICD-10-CM | POA: Diagnosis present

## 2015-01-22 NOTE — Therapy (Signed)
[place], [place], [address] Phone: [phone]   Fax:  [phone]  Physical Therapy Evaluation  Patient Details  Name: Cynthia Wilson MRN: [id number] Date of Birth: 11-21-1965 Referring Provider:  Kinnie Feil, MD  Encounter Date: 01/22/2015      PT End of Session - 01/22/15 1753    Visit Number 1   Number of Visits 6   Date for PT Re-Evaluation 03/05/15   Authorization Type [employer]   Authorization Time Period 03-05-15   PT Start Time 0430   PT Stop Time 0523   PT Time Calculation (min) 53 min   Activity Tolerance Patient tolerated treatment well   Behavior During Therapy [facility] for tasks assessed/performed      Past Medical History  Diagnosis Date  . GERD (gastroesophageal reflux disease)   . Hypertension     under control with med., has been on med. x 5 yr.  . Non-insulin dependent type 2 diabetes mellitus   . History of osteoarthritis     left knee  . Wears partial dentures     upper  . Immature cataract   . Medial meniscus tear 09/2013    right knee    Past Surgical History  Procedure Laterality Date  . Knee arthroscopy Left 2011  . Total knee arthroplasty Left 02/11/2013    Procedure: LEFT TOTAL KNEE ARTHROPLASTY;  Surgeon: Alta Corning, MD;  Location: [place];  Service: Orthopedics;  Laterality: Left;  NO COMPUTER  . Salpingoophorectomy Left 04/21/2013    Procedure: SALPINGO OOPHORECTOMY;  Surgeon: Lavonia Drafts, MD;  Location: [place] [facility];  Service: Gynecology;  Laterality: Left;  . Laparoscopy N/A 04/21/2013    Procedure: LAPAROSCOPY DIAGNOSTIC;  Surgeon: Lavonia Drafts, MD;  Location: [place] [facility];  Service: Gynecology;  Laterality: N/A;  . Laparotomy N/A 04/21/2013    Procedure: LAPAROTOMY;  Surgeon: Lavonia Drafts, MD;  Location: [place] [place] [facility];  Service: Gynecology;  Laterality: N/A;  . Total abdominal hysterectomy  08/28/2005  . Lysis of adhesion  08/28/2005; 03/25/2007;  04/21/2013  . Unilateral salpingectomy Right 08/28/2005  . Cyst excision  03/25/2007    resection peritoneal inclusion cyst  . Umbilical hernia repair  1984  . Knee arthroscopy Right 10/21/2013    Procedure: ARTHROSCOPY RIGHT KNEE;  Surgeon: Alta Corning, MD;  Location: [place];  Service: Orthopedics;  Laterality: Right;  medial , lateral and patella femoral chondromalsia and medial plica    There were no vitals filed for this visit.  Visit Diagnosis:  Abnormal posture  Other osteoarthritis of spine, lumbar region  Left-sided low back pain without sciatica  Sacral pain      Subjective Assessment - 01/22/15 1636    Subjective Pt reports pain started in left abdomen and now is on left low back(points to Left SI) with no known incident.  Pain began in March 2016 .   Limitations Standing   How long can you stand comfortably? 1 hour before pain   Patient Stated Goals return to gym and be able to stand and shop for more than 1 hour   Currently in Pain? Yes   Pain Score 4    Pain Location Back   Pain Orientation Left   Pain Type Chronic pain   Pain Onset More than a month ago   Pain Frequency Intermittent   Aggravating Factors  Standing for longer than 1 hour, turning over in bed   Pain Relieving Factors medication(Mobix)  [facility] PT Assessment - 01/22/15 1639    Assessment   Medical Diagnosis Left sided back pain without sciatica   Onset Date/Surgical Date 09/13/14   Hand Dominance Right   Prior Therapy none  for R TKR but not back   Precautions   Precautions None   Restrictions   Weight Bearing Restrictions No   Balance Screen   Has the patient fallen in the past 6 months No   Has the patient had a decrease in activity level because of a fear of falling?  No   Is the patient reluctant to leave their home because of a fear of falling?  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   Cognition   Overall Cognitive Status Within Functional Limits for tasks assessed   Observation/Other Assessments   Observations Pt sits with left lean and compressed Right torso   Focus on Therapeutic Outcomes (FOTO)  intake 70%, limitation 30% predicted 26%   Posture/Postural Control   Posture/Postural Control Postural limitations   Postural Limitations Forward head;Rounded Shoulders;Decreased lumbar lordosis  Wt shift forward on toes   AROM   Lumbar Flexion 85   Lumbar Extension 11  pain with ext   Lumbar - Right Side Bend 25   Lumbar - Left Side Bend 15  pain on left SI   Lumbar - Right Rotation WNL   Lumbar - Left Rotation WNL   Strength   Overall Strength Comments Abdominals 2/5   Right Hip Flexion 4+/5   Right Hip Extension 3+/5   Right Hip ABduction 4/5   Left Hip Flexion 4+/5   Left Hip Extension 3+/5  unable to perform a bridge exercise with full AROM   Left Hip ABduction 4/5   Right Knee Flexion 5/5   Right Knee Extension 4+/5   Left Knee Flexion 5/5   Left Knee Extension 4+/5   Flexibility   Hamstrings bil 70 degrees  no pain on end range   Palpation   Palpation comment left PSIS and sacral border tender   Pelvic Dictraction   Findings Negative   Side  Left   Pelvic Compression   Findings Negative   Side Left   Sacral thrust    Findings Positive   Side Left   Comments mild pain with thrust                    [facility] Adult PT Treatment/Exercise - 01/22/15 1639    Posture/Postural Control   Posture Comments pelvic level higher on Right than left   Lumbar Exercises: Stretches   Quadruped Mid Back Stretch --  forward   Prone Mid Back Stretch 3 reps;30 seconds   Prone Mid Back Stretch Limitations with relief in low back   Lumbar  Exercises: Sidelying   Other Sidelying Lumbar Exercises left sidelying Quadratus lumborum stretch. 3 min   Manual Therapy   Myofascial Release Right Qaudratus lumborum in left sidelying                PT Education - 01/22/15 1730    Education provided Yes   Education Details Pt educated on initial posture and body mechanics and given initial stretching program for R quadruatus  Lumborum.  Explanation of findings of eval and plan   Person(s) Educated Patient   Methods Explanation;Demonstration;Verbal cues;Handout   Comprehension Verbalized understanding;Returned demonstration          PT Short Term Goals - 01/22/15 1809    PT SHORT TERM GOAL #1   Title STG=LTG           PT Long Term Goals - 01/22/15 1809    PT LONG TERM GOAL #1   Title Pt will be independent with advance HEP for Core strenght   Time 6   Period Weeks   Status New   PT LONG TERM GOAL #2   Title DEmonstrate and verbalize techniques to reduce the risk of re-injury including lifting, Posture, body mechanics   Time 6   Period Weeks   Status New   PT LONG TERM GOAL #3   Title Pt will tolerate standing and walking for 1 -2 hours in order to shop with minimal back pain 1/10 or less   Time 6   Period Weeks   Status New   PT LONG TERM GOAL #4   Title Pt will be 0/10 with all functional activities   Time 6   Period Weeks   Status New   PT LONG TERM GOAL #5   Title FOTO will improve from 30 % limitation to at least 26% limitation to  indicate improved functional mobility   Time 6   Period Weeks   Status New               Plan - 01/22/15 1754    Clinical Impression Statement 49 yo female with intermittent back pain since mid March 2016 with no incident.  Pt with tenderness to palpation on Left PSIS and left border of sacrum. Pt with elevated pelvic level R>L causing shearing force on Left S-I. Pt responds will to  R Quadratus Lumborum stretch in Left sidelying and Myofascial release.  Pain  initially 4/10 to 2/10 at end of treatment.  Pt with decreased AROM of Lumbar spine with pain, Core weakness  with abdomen 2/5 and hip extension bil 3+5.  Pt with abnormal posture, core weakness and excessive lordotic posture.  Pt will benefit from learning a core HEP 1 x week for 6 weeks and shoulde be able to return to gym and work activites as PLOF   Pt will benefit from skilled therapeutic intervention in order to improve on the following deficits Decreased range of motion;Decreased strength;Hypomobility;Increased fascial restricitons;Improper body mechanics;Obesity;Pain;Postural dysfunction   Rehab Potential Good   PT Frequency 1x / week   PT Duration 6 weeks   PT Treatment/Interventions ADLs/Self Care Home Management;Cryotherapy;Electrical Stimulation;Moist Heat;Ultrasound;Neuromuscular re-education;Therapeutic exercise;Functional mobility training;Patient/family education;Manual techniques;Dry needling   PT Next Visit Plan educate on Core Abdominal 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 DJD (degenerative joint disease), lumbar 12/29/2014  . Adnexal mass 04/21/2013  . 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 Osteoarthritis of left knee 02/11/2013  . Heart murmur 02/01/2013  . Diabetes Mellitus 06/17/2012  . Vertigo 02/07/2011  . KNEE PAIN, LEFT, CHRONIC 07/30/2009  . GERD 01/17/2009  . Goiter 08/27/2006  . Obesity 08/27/2006  . HYPERTENSION, BENIGN SYSTEMIC 08/27/2006     Voncille Lo, PT 01/22/2015  6:17 PM Phone: [phone] Fax: [place] [facility] [address] [place], [place], [address] Phone: [phone]   Fax:  [phone]

## 2015-01-22 NOTE — Patient Instructions (Addendum)
Posture Tips DO: - stand tall and erect - keep chin tucked in - keep head and shoulders in alignment - check posture regularly in mirror or large window - pull head back against headrest in car seat;  Change your position often.  Sit with lumbar support. DON'T: - slouch or slump while watching TV or reading - sit, stand or lie in one position  for too long;  Sitting is especially hard on the spine so if you sit at a desk/use the computer, then stand up often!   Copyright  VHI. All rights reserved.  Posture - Standing   Good posture is important. Avoid slouching and forward head thrust. Maintain curve in low back and align ears over shoul- ders, hips over ankles.  Pull your belly button in toward your back bone. Remember to stand with even weight in toes and heels.  Lift ribs, not military style.  Golden string to the sky with chin tuck.  Elongate spine   Copyright  VHI. All rights reserved.  Posture - Sitting   Sit upright, head facing forward. Try using a roll to support lower back. Keep shoulders relaxed, and avoid rounded back. Keep hips level with knees. Avoid crossing legs for long periods.  Sit on sit bones not tail bone.  Use a lumbar support and sit all the way back into chair.  Do quadratus lumborum stretch in Right sidelying for at least 3 minutes 1-2 times a day.  Do as shown in clinic with pillow underneath. As well as prone child's pose as shown in clinic.  Pt given handouts to take home for exercise.  Posture sent by [place] [employer]  Pt left without HEP/posture  Voncille Lo, PT 01/22/2015 5:12 PM Phone: [phone] Fax: [phone]    Copyright  VHI. All rights reserved.

## 2015-02-07 ENCOUNTER — Ambulatory Visit: Payer: Managed Care, Other (non HMO) | Attending: Family Medicine | Admitting: Physical Therapy

## 2015-02-07 DIAGNOSIS — R293 Abnormal posture: Secondary | ICD-10-CM | POA: Insufficient documentation

## 2015-02-07 DIAGNOSIS — M533 Sacrococcygeal disorders, not elsewhere classified: Secondary | ICD-10-CM | POA: Diagnosis present

## 2015-02-07 DIAGNOSIS — M545 Low back pain, unspecified: Secondary | ICD-10-CM

## 2015-02-07 NOTE — Therapy (Signed)
[place], [place], [address] Phone: [phone]   Fax:  [phone]  Physical Therapy Treatment  Patient Details  Name: Cynthia Wilson MRN: [id number] Date of Birth: 01-16-1966 Referring Provider:  Kinnie Feil, MD  Encounter Date: 02/07/2015      PT End of Session - 02/07/15 1708    Visit Number 2   Number of Visits 6   Date for PT Re-Evaluation 03/05/15   PT Start Time 2951   PT Stop Time 8841   PT Time Calculation (min) 45 min   Activity Tolerance Patient tolerated treatment well;No increased pain   Behavior During Therapy [facility] for tasks assessed/performed      Past Medical History  Diagnosis Date  . GERD (gastroesophageal reflux disease)   . Hypertension     under control with med., has been on med. x 5 yr.  . Non-insulin dependent type 2 diabetes mellitus   . History of osteoarthritis     left knee  . Wears partial dentures     upper  . Immature cataract   . Medial meniscus tear 09/2013    right knee    Past Surgical History  Procedure Laterality Date  . Knee arthroscopy Left 2011  . Total knee arthroplasty Left 02/11/2013    Procedure: LEFT TOTAL KNEE ARTHROPLASTY;  Surgeon: Alta Corning, MD;  Location: [place];  Service: Orthopedics;  Laterality: Left;  NO COMPUTER  . Salpingoophorectomy Left 04/21/2013    Procedure: SALPINGO OOPHORECTOMY;  Surgeon: Lavonia Drafts, MD;  Location: [place] [facility];  Service: Gynecology;  Laterality: Left;  . Laparoscopy N/A 04/21/2013    Procedure: LAPAROSCOPY DIAGNOSTIC;  Surgeon: Lavonia Drafts, MD;  Location: [place] [facility];  Service: Gynecology;  Laterality: N/A;  . Laparotomy N/A 04/21/2013    Procedure: LAPAROTOMY;  Surgeon: Lavonia Drafts, MD;  Location: [place] [facility];  Service: Gynecology;  Laterality: N/A;  . Total abdominal hysterectomy  08/28/2005  . Lysis of adhesion  08/28/2005; 03/25/2007; 04/21/2013  . Unilateral salpingectomy Right  08/28/2005  . Cyst excision  03/25/2007    resection peritoneal inclusion cyst  . Umbilical hernia repair  1984  . Knee arthroscopy Right 10/21/2013    Procedure: ARTHROSCOPY RIGHT KNEE;  Surgeon: Alta Corning, MD;  Location: [place];  Service: Orthopedics;  Laterality: Right;  medial , lateral and patella femoral chondromalsia and medial plica    There were no vitals filed for this visit.  Visit Diagnosis:  Abnormal posture  Left-sided low back pain without sciatica  Sacral pain      Subjective Assessment - 02/07/15 1554    Subjective Child;'s pose hurts total knee replacement.   Friday and Sat  night pain kept her up for the first time it woke her up.  She was lying on her Lt side.   8/10,  Daytimes were OK.  RT side pain was new.    Pain Score 4    Pain Location Back   Pain Orientation Left   Pain Frequency Constant   Aggravating Factors  comes at night , standing and walking longer.  Sometimes turning over in bed painful   Pain Relieving Factors Medication ibuprophen, heating pad.   Multiple Pain Sites No                         [facility] Adult PT Treatment/Exercise - 02/07/15 1559    Lumbar Exercises: Stretches   Passive Hamstring Stretch 3  reps;30 seconds   Single Knee to Chest Stretch 5 reps  X5, also prformed 5 X more at end of session   Lumbar Exercises: Supine   Ab Set 5 reps;5 seconds  cued breathing   Bent Knee Raise 5 reps  with abdominal bracing.   Bridge 10 reps;1 second   Bridge Limitations cued to move knees away helpful   Other Supine Lumbar Exercises scissors    Lumbar Exercises: Prone   Other Prone Lumbar Exercises multifitus press, press with knee flexion, press with lSLR too hard 2 reps,  Presses 5 reps both.   Manual Therapy   Myofascial Release Non tender today                PT Education - 02/07/15 1707    Education provided Yes   Education Details scissors Level 2 and 1   Person(s) Educated Patient    Methods Explanation;Demonstration;Tactile cues;Verbal cues;Handout   Comprehension Verbalized understanding;Returned demonstration          PT Short Term Goals - 01/22/15 1809    PT SHORT TERM GOAL #1   Title STG=LTG           PT Long Term Goals - 01/22/15 1809    PT LONG TERM GOAL #1   Title Pt will be independent with advance HEP for Core strenght   Time 6   Period Weeks   Status New   PT LONG TERM GOAL #2   Title DEmonstrate and verbalize techniques to reduce the risk of re-injury including lifting, Posture, body mechanics   Time 6   Period Weeks   Status New   PT LONG TERM GOAL #3   Title Pt will tolerate standing and walking for 1 -2 hours in order to shop with minimal back pain 1/10 or less   Time 6   Period Weeks   Status New   PT LONG TERM GOAL #4   Title Pt will be 0/10 with all functional activities   Time 6   Period Weeks   Status New   PT LONG TERM GOAL #5   Title FOTO will improve from 30 % limitation to at least 26% limitation to  indicate improved functional mobility   Time 6   Period Weeks   Status New               Plan - 02/07/15 1709    Clinical Impression Statement No pain at end of session.  Able to progress home exercises (scissors) goal.  Bridges difficult.     PT Home Exercise Plan scissor review, hip strengthening.  Stretching   Consulted and Agree with Plan of Care Patient        Problem List Patient Active Problem List   Diagnosis Date Noted  . DJD (degenerative joint disease), lumbar 12/29/2014  . Adnexal mass 04/21/2013  . 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 Osteoarthritis of left knee 02/11/2013  . Heart murmur 02/01/2013  . Diabetes Mellitus 06/17/2012  . Vertigo 02/07/2011  . KNEE PAIN, LEFT, CHRONIC 07/30/2009  . GERD 01/17/2009  . Goiter 08/27/2006  . Obesity 08/27/2006  . HYPERTENSION, BENIGN SYSTEMIC 08/27/2006    [facility] 02/07/2015, 5:12 PM  [facility] [address] [place], [place], [address] Phone: [phone]   Fax:  [phone]    Melvenia Needles, PTA 02/07/2015 5:12 PM Phone: [phone] Fax: [phone]

## 2015-02-14 ENCOUNTER — Ambulatory Visit: Payer: Managed Care, Other (non HMO) | Admitting: Physical Therapy

## 2015-02-21 ENCOUNTER — Ambulatory Visit: Payer: Managed Care, Other (non HMO) | Admitting: Physical Therapy

## 2015-02-21 DIAGNOSIS — R293 Abnormal posture: Secondary | ICD-10-CM | POA: Diagnosis not present

## 2015-02-21 NOTE — Therapy (Addendum)
[place], [place], [address] Phone: [phone]   Fax:  [phone]  Physical Therapy Treatment/Discharge Note  Patient Details  Name: Cynthia Wilson MRN: [id number] Date of Birth: [birth year] Referring Provider:  Kinnie Feil, MD  Encounter Date: 02/21/2015      PT End of Session - 02/21/15 1755    Visit Number 3   Number of Visits 6   Date for PT Re-Evaluation 03/05/15   PT Start Time 2683   PT Stop Time 4196   PT Time Calculation (min) 43 min   Activity Tolerance Patient tolerated treatment well;No increased pain   Behavior During Therapy [facility] for tasks assessed/performed      Past Medical History  Diagnosis Date  . GERD (gastroesophageal reflux disease)   . Hypertension     under control with med., has been on med. x 5 yr.  . Non-insulin dependent type 2 diabetes mellitus   . History of osteoarthritis     left knee  . Wears partial dentures     upper  . Immature cataract   . Medial meniscus tear 09/2013    right knee    Past Surgical History  Procedure Laterality Date  . Knee arthroscopy Left 2011  . Total knee arthroplasty Left 02/11/2013    Procedure: LEFT TOTAL KNEE ARTHROPLASTY;  Surgeon: Alta Corning, MD;  Location: [place];  Service: Orthopedics;  Laterality: Left;  NO COMPUTER  . Salpingoophorectomy Left 04/21/2013    Procedure: SALPINGO OOPHORECTOMY;  Surgeon: Lavonia Drafts, MD;  Location: [place] [facility];  Service: Gynecology;  Laterality: Left;  . Laparoscopy N/A 04/21/2013    Procedure: LAPAROSCOPY DIAGNOSTIC;  Surgeon: Lavonia Drafts, MD;  Location: [place] [facility];  Service: Gynecology;  Laterality: N/A;  . Laparotomy N/A 04/21/2013    Procedure: LAPAROTOMY;  Surgeon: Lavonia Drafts, MD;  Location: [place] [facility];  Service: Gynecology;  Laterality: N/A;  . Total abdominal hysterectomy  08/28/2005  . Lysis of adhesion  08/28/2005; 03/25/2007; 04/21/2013  . Unilateral  salpingectomy Right 08/28/2005  . Cyst excision  03/25/2007    resection peritoneal inclusion cyst  . Umbilical hernia repair  1984  . Knee arthroscopy Right 10/21/2013    Procedure: ARTHROSCOPY RIGHT KNEE;  Surgeon: Alta Corning, MD;  Location: [place];  Service: Orthopedics;  Laterality: Right;  medial , lateral and patella femoral chondromalsia and medial plica    There were no vitals filed for this visit.  Visit Diagnosis:  Abnormal posture      Subjective Assessment - 02/21/15 1747    Subjective No pain.  Exercises OK.   Currently in Pain? No/denies                         [facility] Adult PT Treatment/Exercise - 02/21/15 1552    Lumbar Exercises: Stretches   Pelvic Tilt Limitations 10 reps   Lumbar Exercises: Standing   Other Standing Lumbar Exercises posterior pelvic tilt  to decrease lumbar loading.   Lumbar Exercises: Supine   Ab Set 5 reps;5 seconds   Bridge 10 reps  able to lift higher   Other Supine Lumbar Exercises scissors Levels 1 and 2 cues   Other Supine Lumbar Exercises reformer foot work, foot, heels with shoulder width, hip IR/ Hip ER cues     closly monitored, cued as needed.     Lumbar Exercises: Sidelying   Clam --  10 each side, minor cues initially  Hip Abduction 10 reps  both   Lumbar Exercises: Prone   Other Prone Lumbar Exercises multifitus 5 reps, wuith knee flexion 5 reps.                  PT Short Term Goals - 01/22/15 1809    PT SHORT TERM GOAL #1   Title STG=LTG           PT Long Term Goals - 02/21/15 1800    PT LONG TERM GOAL #1   Title Pt will be independent with advance HEP for Core strenght   Baseline independent with exercises issued so far.   Time 6   Period Weeks   Status On-going   PT LONG TERM GOAL #2   Title DEmonstrate and verbalize techniques to reduce the risk of re-injury including lifting, Posture, body mechanics   Time 6   Period Weeks   Status On-going   PT LONG TERM  GOAL #3   Time 6   Period Weeks   Status Unable to assess   PT LONG TERM GOAL #4   Title Pt will be 0/10 with all functional activities   Baseline No pain today   Time 6   Period Weeks   Status On-going   PT LONG TERM GOAL #5   Title FOTO will improve from 30 % limitation to at least 26% limitation to  indicate improved functional mobility   Time 6   Period Weeks   Status Unable to assess               Plan - 02/21/15 1755    Clinical Impression Statement No pain at end of session or during.  Bridges - lifting higher.   PT Next Visit Plan try pallof press, wall push ups and leg press if still painfree.  Issue posture handout and review.  Home walking program?   PT Home Exercise Plan continue initial home exercises.   Consulted and Agree with Plan of Care Patient        Problem List Patient Active Problem List   Diagnosis Date Noted  . DJD (degenerative joint disease), lumbar 12/29/2014  . Adnexal mass 04/21/2013  . 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 Osteoarthritis of left knee 02/11/2013  . Heart murmur 02/01/2013  . Diabetes Mellitus 06/17/2012  . Vertigo 02/07/2011  . KNEE PAIN, LEFT, CHRONIC 07/30/2009  . GERD 01/17/2009  . Goiter 08/27/2006  . Obesity 08/27/2006  . HYPERTENSION, BENIGN SYSTEMIC 08/27/2006    [facility] 02/21/2015, 6:02 PM  [facility] [address] [place], [place], [address] Phone: [phone]   Fax:  [phone]     Melvenia Needles, PTA 02/21/2015 6:02 PM Phone: [phone] Fax: [phone]  PHYSICAL THERAPY DISCHARGE SUMMARY  Visits from Start of Care: 3  Current functional level related to goals / functional outcomes: As above   Remaining deficits: unknown   Education / Equipment: Initial HEP Plan: Patient agrees to discharge.  Patient goals were partially met. Patient is being discharged due to the patient's request.  ????? Pt called to cancel  all further appt because she feels she does not need any further PT        Voncille Lo, PT 04/30/2015 5:54 PM Phone: [phone] Fax: [phone]

## 2015-02-28 ENCOUNTER — Ambulatory Visit: Payer: Managed Care, Other (non HMO) | Admitting: Physical Therapy

## 2015-03-07 ENCOUNTER — Encounter: Payer: Managed Care, Other (non HMO) | Admitting: Physical Therapy

## 2015-03-19 ENCOUNTER — Encounter: Payer: Managed Care, Other (non HMO) | Admitting: Physical Therapy

## 2015-03-21 ENCOUNTER — Encounter: Payer: Managed Care, Other (non HMO) | Admitting: Physical Therapy

## 2015-03-26 ENCOUNTER — Encounter: Payer: Managed Care, Other (non HMO) | Admitting: Physical Therapy

## 2015-05-25 ENCOUNTER — Other Ambulatory Visit: Payer: Self-pay | Admitting: Family Medicine

## 2015-07-03 ENCOUNTER — Encounter: Payer: Self-pay | Admitting: Family Medicine

## 2015-07-03 ENCOUNTER — Ambulatory Visit (INDEPENDENT_AMBULATORY_CARE_PROVIDER_SITE_OTHER): Payer: Managed Care, Other (non HMO) | Admitting: Family Medicine

## 2015-07-03 VITALS — BP 140/73 | HR 84 | Temp 98.4°F | Wt 256.1 lb

## 2015-07-03 DIAGNOSIS — M47816 Spondylosis without myelopathy or radiculopathy, lumbar region: Secondary | ICD-10-CM | POA: Diagnosis not present

## 2015-07-03 DIAGNOSIS — J069 Acute upper respiratory infection, unspecified: Secondary | ICD-10-CM | POA: Insufficient documentation

## 2015-07-03 DIAGNOSIS — I10 Essential (primary) hypertension: Secondary | ICD-10-CM

## 2015-07-03 DIAGNOSIS — E669 Obesity, unspecified: Secondary | ICD-10-CM

## 2015-07-03 DIAGNOSIS — E119 Type 2 diabetes mellitus without complications: Secondary | ICD-10-CM | POA: Diagnosis not present

## 2015-07-03 LAB — POCT GLYCOSYLATED HEMOGLOBIN (HGB A1C): HEMOGLOBIN A1C: 6

## 2015-07-03 MED ORDER — HYDROCODONE-HOMATROPINE 5-1.5 MG/5ML PO SYRP: 5.0000 mL | ORAL_SOLUTION | Freq: Three times a day (TID) | ORAL | Status: DC | PRN

## 2015-07-03 NOTE — Assessment & Plan Note (Signed)
BP looks ok. She is compliant with HCTZ. Denies previous use of ACE1 although she is diabetic. Urine microalbumin checked today. I will consider switching to to ACEi from HCTZ. Patient agreed with plan.

## 2015-07-03 NOTE — Assessment & Plan Note (Signed)
S/P PT. My acute change or worsening of her symptoms. No neurologic deficit. Plan to continue Tylenol as needed for pain since it helps. F/U as needed. Consider orthopedic referral if worsening.

## 2015-07-03 NOTE — Assessment & Plan Note (Signed)
She gained 5lbs since last visit. Diet and exercise counseling done. Weight loss will help a lot with her DM/HTN and even back pain. I will reassess her in 3-4 months as she continues to work on weight loss. Nutritionist referral recommended but she declined.

## 2015-07-03 NOTE — Assessment & Plan Note (Signed)
Likely viral. Hycodan prescribed prn cough and congestion. Return precaution discussed.

## 2015-07-03 NOTE — Patient Instructions (Signed)

## 2015-07-03 NOTE — Assessment & Plan Note (Addendum)
A1C checked today was 6. Patient contacted with result and instruction after she left. She is advised to stop Metformin 250 mg qd. Diet and exercise counseling done for weight loss. F/U in 3-4 months for A1C check. If worsening,I will place her back on metformin. Foot exam completed today. She stated she will schedule her eye exam as soon as possible. Pneumovax recommended but she declined.  She mentioned she got flu shot at work.

## 2015-07-03 NOTE — Progress Notes (Signed)
Subjective:     Patient ID: Cynthia Wilson, female   DOB: 01/23/1966, 50 y.o.   MRN: [id number]  HPI DM/HTN: here for follow up. She is currently on metformin 250 mg XL qd for about 4 months now. She has been compliant with her medication although not so compliant with diet and exercise. She is on HCTZ for her HTN and not on ACEi. No other concern today. She will like to know if she can stop her Metformin altogether.  Back pain:Still having low back pain, initially on the left but now on the right side. She uses Tylenol arthritis as needed which helps her sleep at night. Sitting still for a long time makes it hurt more. She had PT already. No other concern. Sore throat/Cough: C/O cough and sore throat for 4 days. She denies any fever. She produces yellowish sputum, no obvious blood but at times brownish in color. She denies chest pain, her nose is congested affecting her breathing. There is associated headache. No change in vision with her headache. She uses Nyquil prn cough and mucinex for congestion with no improvement. She started Afrin yesterday. She uses humidifier at home as well.  Weight management:Patient has not been doing well with her diet and exercise. She is here for follow up.  Current Outpatient Prescriptions on File Prior to Visit  Medication Sig Dispense Refill  . hydrochlorothiazide (HYDRODIURIL) 25 MG tablet TAKE 1 TABLET BY MOUTH EVERY DAY 30 tablet 4  . metFORMIN (GLUCOPHAGE-XR) 500 MG 24 hr tablet TAKE 1 TABLET (500 MG TOTAL) BY MOUTH DAILY WITH BREAKFAST. (Patient taking differently: TAKE 1/2 TABLET (500 MG TOTAL) BY MOUTH DAILY WITH BREAKFAST.) 30 tablet 5  . omeprazole (PRILOSEC) 20 MG capsule TAKE 1 CAPSULE (20 MG TOTAL) BY MOUTH DAILY. FOR HEARTBURN (Patient not taking: Reported on 12/29/2014) 30 capsule 2   No current facility-administered medications on file prior to visit.   Past Medical History  Diagnosis Date  . GERD (gastroesophageal reflux disease)   .  Hypertension     under control with med., has been on med. x 5 yr.  . Non-insulin dependent type 2 diabetes mellitus ([place])   . History of osteoarthritis     left knee  . Wears partial dentures     upper  . Immature cataract   . Medial meniscus tear 09/2013    right knee     Review of Systems  Respiratory: Positive for cough. Negative for chest tightness and wheezing.   Cardiovascular: Negative.   Gastrointestinal: Negative.   Genitourinary: Negative.   All other systems reviewed and are negative.  Filed Vitals:   07/03/15 0937  BP: 140/73  Pulse: 84  Temp: 98.4 F (36.9 C)  TempSrc: Oral  Weight: 256 lb 1.6 oz (116.166 kg)       Objective:   Physical Exam  Constitutional: She is oriented to person, place, and time. She appears well-developed. No distress.  HENT:  Head: Normocephalic.  Right Ear: External ear normal.  Left Ear: External ear normal.  Mouth/Throat: Oropharynx is clear and moist. No oropharyngeal exudate.  Eyes: Conjunctivae are normal. Pupils are equal, round, and reactive to light. Right eye exhibits no discharge. Left eye exhibits no discharge.  Neck: Neck supple.  Cardiovascular: Normal rate, regular rhythm, normal heart sounds and intact distal pulses.   No murmur heard. Pulmonary/Chest: Effort normal and breath sounds normal. No respiratory distress. She has no wheezes.  Abdominal: Soft. Bowel sounds are normal. She exhibits no distension  and no mass. There is no tenderness.  Musculoskeletal: Normal range of motion. She exhibits no edema.  Sensory exam of the foot is normal, tested with the monofilament. Good pulses, no lesions or ulcers, good peripheral pulses.   Neurological: She is alert and oriented to person, place, and time. No cranial nerve deficit.  Skin: Skin is warm.  Nursing note and vitals reviewed.      Assessment:     DM2 HTN DJD lower spine Obesity     Plan:     Check problem list.

## 2015-07-04 ENCOUNTER — Encounter: Payer: Self-pay | Admitting: Family Medicine

## 2015-07-04 LAB — MICROALBUMIN / CREATININE URINE RATIO
CREATININE, URINE: 155 mg/dL (ref 20–320)
MICROALB UR: 0.6 mg/dL
Microalb Creat Ratio: 4 mcg/mg creat (ref <30)

## 2015-07-26 ENCOUNTER — Ambulatory Visit (INDEPENDENT_AMBULATORY_CARE_PROVIDER_SITE_OTHER): Payer: Managed Care, Other (non HMO) | Admitting: Internal Medicine

## 2015-07-26 ENCOUNTER — Encounter: Payer: Self-pay | Admitting: Internal Medicine

## 2015-07-26 VITALS — BP 118/78 | HR 96 | Temp 98.2°F | Resp 12 | Wt 258.0 lb

## 2015-07-26 DIAGNOSIS — E0590 Thyrotoxicosis, unspecified without thyrotoxic crisis or storm: Secondary | ICD-10-CM | POA: Insufficient documentation

## 2015-07-26 DIAGNOSIS — E042 Nontoxic multinodular goiter: Secondary | ICD-10-CM | POA: Diagnosis not present

## 2015-07-26 NOTE — Progress Notes (Signed)
Patient ID: Cynthia Wilson, female   DOB: 1966/01/22, 50 y.o.   MRN: [id number]   HPI  Cynthia Wilson is a 49 y.o.-year-old female, referred by her PCP, Dr. Gwendlyn Deutscher, in consultation for subclinical thyrotoxicosis and goiter.  She has MNG since 2004.  I reviewed pt's thyroid tests: Lab Results  Component Value Date   TSH 0.69 07/25/2014   TSH 0.268* 07/07/2014   TSH 0.523 03/09/2012   FREET4 0.77 07/25/2014   FREET4 1.00 07/07/2014  TPO Abs were normal, at 1.  She had a Thyroid scan in 2004: 1. NORMAL RIGHT LOBE OF THE THYROID. 2. THREE COLD NODULES OF THE LEFT LOBE.   She had a thyroid U/S (04/08/2003): 1. DOMINANT 3.3 CM IN DIAMETER COMPLEX MASS CONTAINING CYSTIC AND SOLID COMPONENTS INVOLVING THE MAJORITY OF THE MID AND LOWER POLE OF THE LEFT LOBE OF THYROID CORRESPONDING TO THE COLD NODULE SEEN IN THIS REGION ON NUCLEAR MEDICINE STUDY.  2. THERE IS A SECOND 1.6 CM SIZE COMPLEX NODULE ASSOCIATED IN THE LOWER POLE OF THE LEFT LOBE OF THYROID. 3.SMALLER (6 AND 8 MM IN SIZE) NODULES WITHIN THE MID RIGHT LOBE OF THE THYROID.  L Thyroid nodule FNA (05/10/2003): STATEMENT OF SPECIMEN ADEQUACY: Satisfactory for evaluation.  INTERPRETATION(S): LEFT THYROID NODULE, FINE NEEDLE ASPIRATION, THIN PREP, SMEARS AND CELL BLOCK: SCANT   FOLLICULAR EPITHELIUM, COLLOID AND PIGMENTED MACROPHAGES. SEE COMMENT.  COMMENT The morphologic features are most consistent with a colloid nodule.   Pt had a thyroid U/S at [facility] (05/29/2014):   Several R nodules:1.4 cm (heterogeneous) and 1.1 cm (hypoechoic), vascular, nodules and 3 subcm nodules  3 L nodules: 2.1 cm (hypervascular), the rest smaller: 1.5 and 0.7 cm hypoechoic nodules.  Pt denies feeling nodules in neck, hoarseness, dysphagia/odynophagia, SOB with lying down; she denies: - fatigue - excessive sweating/heat intolerance - tremors - anxiety - palpitations - hyperdefecation - weight loss  Pt does not have a  FH of thyroid ds. No FH of thyroid cancer. No h/o radiation tx to head or neck.  No seaweed or kelp, no recent contrast studies. No steroid use. No herbal supplements. On Biotin  - took it this am, started to take this last week.  I reviewed her chart and she also has a history of HTN,  DM2, GERD.  ROS: Constitutional: no weight gain/loss, no fatigue, no subjective hyperthermia/hypothermia Eyes: no blurry vision, no xerophthalmia ENT: no sore throat, no nodules palpated in throat, no dysphagia/odynophagia, no hoarseness Cardiovascular: no CP/SOB/palpitations/leg swelling Respiratory: no cough/SOB Gastrointestinal: no N/V/D/C/heartburn Musculoskeletal: no muscle/joint aches Skin: no rashes Neurological: no tremors/numbness/tingling/dizziness  I reviewed pt's medications, allergies, PMH, social hx, family hx, and changes were documented in the history of present illness. Otherwise, unchanged from my initial visit note. She stopped Metformin as HbA1c has been at target.  Past Medical History  Diagnosis Date  . GERD (gastroesophageal reflux disease)   . Hypertension     under control with med., has been on med. x 5 yr.  . Non-insulin dependent type 2 diabetes mellitus ([place])   . History of osteoarthritis     left knee  . Wears partial dentures     upper  . Immature cataract   . Medial meniscus tear 09/2013    right knee   Past Surgical History  Procedure Laterality Date  . Knee arthroscopy Left 2011  . Total knee arthroplasty Left 02/11/2013    Procedure: LEFT TOTAL KNEE ARTHROPLASTY;  Surgeon: Alta Corning, MD;  Location: [facility];  Service: Orthopedics;  Laterality: Left;  NO COMPUTER  . Salpingoophorectomy Left 04/21/2013    Procedure: SALPINGO OOPHORECTOMY;  Surgeon: Lavonia Drafts, MD;  Location: [facility];  Service: Gynecology;  Laterality: Left;  . Laparoscopy N/A 04/21/2013    Procedure: LAPAROSCOPY DIAGNOSTIC;  Surgeon: Lavonia Drafts, MD;  Location: [facility];   Service: Gynecology;  Laterality: N/A;  . Laparotomy N/A 04/21/2013    Procedure: LAPAROTOMY;  Surgeon: Lavonia Drafts, MD;  Location: [facility];  Service: Gynecology;  Laterality: N/A;  . Total abdominal hysterectomy  08/28/2005  . Lysis of adhesion  08/28/2005; 03/25/2007; 04/21/2013  . Unilateral salpingectomy Right 08/28/2005  . Cyst excision  03/25/2007    resection peritoneal inclusion cyst  . Umbilical hernia repair  1984  . Knee arthroscopy Right 10/21/2013    Procedure: ARTHROSCOPY RIGHT KNEE;  Surgeon: Alta Corning, MD;  Location: [facility];  Service: Orthopedics;  Laterality: Right;  medial , lateral and patella femoral chondromalsia and medial plica   History   Social History  . Marital Status: Married    Spouse Name: N/A    Number of Children: 4   Occupational History  .  Museum/gallery conservator    Social History Main Topics  . Smoking status: Never Smoker   . Smokeless tobacco: Never Used  . Alcohol Use: No  . Drug Use: No  . Sexual Activity: Yes    Birth Control/ Protection: Surgical   Social History Narrative   Lives with husband and 77 year old son.     Current Outpatient Prescriptions on File Prior to Visit  Medication Sig Dispense Refill  . hydrochlorothiazide (HYDRODIURIL) 25 MG tablet TAKE 1 TABLET BY MOUTH EVERY DAY 30 tablet 4  . omeprazole (PRILOSEC) 20 MG capsule TAKE 1 CAPSULE (20 MG TOTAL) BY MOUTH DAILY. FOR HEARTBURN (Patient not taking: Reported on 12/29/2014) 30 capsule 2   No current facility-administered medications on file prior to visit.   Allergies  Allergen Reactions  . Iohexol Hives        . Other Rash    COBAN   Family History  Problem Relation Age of Onset  . Diabetes Mother   . Hypertension Mother   . Hyperlipidemia Mother   . Emphysema Father    PE: BP 118/78 mmHg  Pulse 96  Temp(Src) 98.2 F (36.8 C) (Oral)  Resp 12  Wt 258 lb (117.028 kg)  SpO2 98% Body mass index is 41.66 kg/(m^2).  Wt Readings from  Last 3 Encounters:  07/26/15 258 lb (117.028 kg)  07/03/15 256 lb 1.6 oz (116.166 kg)  12/29/14 251 lb 6 oz (114.023 kg)   Constitutional: overweight, in NAD Eyes: PERRLA, EOMI ENT: moist mucous membranes, left thyroid fullness, no cervical lymphadenopathy Cardiovascular: RRR, No MRG Respiratory: CTA B Gastrointestinal: abdomen soft, NT, ND, BS+ Musculoskeletal: no deformities, strength intact in all 4 Skin: moist, warm, no rashes Neurological: no tremor with outstretched hands, DTR normal in all 4  ASSESSMENT: 1. Subclinical Thyrotoxicosis  2. Multinodular goiter - 2004 FNA of the left dominant nodule benign  PLAN:  1. Patient with mild subclinical thyrotoxicosis, with TFTs that have normalized at last visit. She appears euthyroid. - I suggested that we check the TSH, fT3 and fT4 today, but she has started Biotin, which can lower the TSH artificially >> will stop Biotin and have her back for labs in 5 days. - If the tests remain abnormal, we may need an uptake and scan to differentiate  between possible etiologies: Graves ds., toxic MNG, Thyroiditis  2.Multinodular goiter - Patient with a long history of thyroid nodules, s/p a thyroid scan in 2004 that showed 3 left thyroid nodules that appeared cold on the scan. She had an FNA of the dominant nodule in the left lobe, which at that time measures 3.3 cm and appears complex. The biopsy results were benign. She had a new ultrasound performed on 05/29/2014 at [facility] >> her thyroid was actually smaller compared to the size obtained in 2004. She had several smaller thyroid nodules bilaterally, with the largest one being 2.1 cm, hypervascular, in the left lobe. This appears to correspond to the 3.3 cm nodule seen in the left lobe in 2004. The fact that it was benign at that time and that it decreased in size are indicative of a benign nodule and we discussed at last visit about following it clinically and with another ultrasound  in 2 years if there are no compression signs. - RTC in 1 year, but she will call me before the visit so that I can order the ultrasound and we can review the images when she comes back.

## 2015-07-26 NOTE — Patient Instructions (Signed)
Please stop Biotin for 5 days, then come back for labs.  Please return in 1 year.  

## 2015-10-22 ENCOUNTER — Encounter: Payer: Self-pay | Admitting: *Deleted

## 2015-10-22 NOTE — Telephone Encounter (Signed)
-----   Message from Kinnie Feil, MD sent at 10/22/2015  8:41 AM EDT ----- Please contact patient to schedule follow up appointment with me for her DM management. Thank you.

## 2015-10-22 NOTE — Telephone Encounter (Signed)
LM for patient on cell to call back and schedule an appt. Jachelle Fluty,CMA

## 2015-10-27 ENCOUNTER — Other Ambulatory Visit: Payer: Self-pay | Admitting: Family Medicine

## 2015-10-30 ENCOUNTER — Encounter: Payer: Self-pay | Admitting: Family Medicine

## 2015-10-30 ENCOUNTER — Ambulatory Visit (INDEPENDENT_AMBULATORY_CARE_PROVIDER_SITE_OTHER): Payer: Managed Care, Other (non HMO) | Admitting: Family Medicine

## 2015-10-30 VITALS — BP 138/82 | HR 79 | Temp 98.7°F | Wt 255.0 lb

## 2015-10-30 DIAGNOSIS — I10 Essential (primary) hypertension: Secondary | ICD-10-CM

## 2015-10-30 DIAGNOSIS — R519 Headache, unspecified: Secondary | ICD-10-CM | POA: Insufficient documentation

## 2015-10-30 DIAGNOSIS — Z13220 Encounter for screening for lipoid disorders: Secondary | ICD-10-CM

## 2015-10-30 DIAGNOSIS — E119 Type 2 diabetes mellitus without complications: Secondary | ICD-10-CM | POA: Diagnosis not present

## 2015-10-30 DIAGNOSIS — R51 Headache: Secondary | ICD-10-CM | POA: Diagnosis not present

## 2015-10-30 HISTORY — DX: Headache, unspecified: R51.9

## 2015-10-30 LAB — LIPID PANEL
CHOL/HDL RATIO: 3.9 ratio (ref <=5.0)
Cholesterol: 160 mg/dL (ref 125–200)
HDL: 41 mg/dL — ABNORMAL LOW (ref >=46)
LDL CALC: 90 mg/dL (ref <130)
Triglycerides: 147 mg/dL (ref <150)
VLDL: 29 mg/dL (ref <30)

## 2015-10-30 LAB — BASIC METABOLIC PANEL
BUN: 15 mg/dL (ref 7–25)
CO2: 28 mmol/L (ref 20–31)
CREATININE: 1.02 mg/dL (ref 0.50–1.05)
Calcium: 9.1 mg/dL (ref 8.6–10.4)
Chloride: 102 mmol/L (ref 98–110)
Glucose, Bld: 94 mg/dL (ref 65–99)
Potassium: 4.2 mmol/L (ref 3.5–5.3)
Sodium: 140 mmol/L (ref 135–146)

## 2015-10-30 LAB — POCT GLYCOSYLATED HEMOGLOBIN (HGB A1C): HEMOGLOBIN A1C: 6.7

## 2015-10-30 MED ORDER — MONTELUKAST SODIUM 10 MG PO TABS: 10.0000 mg | ORAL_TABLET | Freq: Every day | ORAL | Status: DC

## 2015-10-30 MED ORDER — METFORMIN HCL ER 500 MG PO TB24: 500.0000 mg | ORAL_TABLET | Freq: Every day | ORAL | Status: DC

## 2015-10-30 MED ORDER — LISINOPRIL 2.5 MG PO TABS
2.5000 mg | ORAL_TABLET | Freq: Every day | ORAL | Status: DC
Start: 2015-10-30 — End: 2016-04-19

## 2015-10-30 NOTE — Assessment & Plan Note (Signed)
A1C worsened to 6.7. Patient restarted back on Metformin 500 mg qd XR. She recently joined [employer] and she has lost 3 lbs since last visit. She is also working on exercise in addition. F/U in 3 months for reassessment.

## 2015-10-30 NOTE — Patient Instructions (Signed)
It was nice seeing you today. Your A1C went up. I will like for [place] to start you back on Metformin. I also started you on low dose Lisinopril to protect your Kidney. Continue your HCTZ for BP as well. If your BP is dropping to low let me know. I will like to see you back in 2-3 months. Call me if you have any questions.

## 2015-10-30 NOTE — Assessment & Plan Note (Addendum)
BP looks good today. Continue HCTZ. I added Lisinopril 2.5 mg to her regimen for renal protection in a DM patient. Monitor BP at home. She is instructed to contact me soon if her BP is dropping too low. Will consider switching her to Lisinopril and d/c HCTZ altogether if that is the case. Bmet and FLP checked today (to screen for hyperlipidemia). She verbalized understanding.

## 2015-10-30 NOTE — Assessment & Plan Note (Signed)
No neurologic deficit. Continue Claritin. I also started her on Singulair q nightly. F/U as needed.

## 2015-10-30 NOTE — Progress Notes (Signed)
Subjective:     Patient ID: Cynthia Wilson, female   DOB: 16-Feb-1966, 50 y.o.   MRN: [id number]  HPI DM2: here for follow up. She has been off Metformin for about 6 months. On diet and exercise control. No new concern. She had eye exam done last month. [id number] with HCTZ 25 mg qd. No new concern. Headache: C/O headache which she attributed to her sinus problem. There is associated facial pressure and left ear pain radiating to the left side of her neck. She denies ear discharge, no hearing loss. No fever, no sick contact. This is a typical symptom for her during this season. She uses Claritin at home with no major improvement. Her headache sometimes triggers her vertigo which makes her feel nauseous, otherwise no vomiting.  Current Outpatient Prescriptions on File Prior to Visit  Medication Sig Dispense Refill  . hydrochlorothiazide (HYDRODIURIL) 25 MG tablet TAKE 1 TABLET BY MOUTH EVERY DAY 90 tablet 2  . omeprazole (PRILOSEC) 20 MG capsule Take 1 capsule (20 mg total) by mouth daily as needed (Heart burn). (Patient not taking: Reported on 10/30/2015) 30 capsule 2   No current facility-administered medications on file prior to visit.   Past Medical History  Diagnosis Date  . GERD (gastroesophageal reflux disease)   . Hypertension     under control with med., has been on med. x 5 yr.  . Non-insulin dependent type 2 diabetes mellitus ([place])   . History of osteoarthritis     left knee  . Wears partial dentures     upper  . Immature cataract   . Medial meniscus tear 09/2013    right knee   Filed Vitals:   10/30/15 0926  BP: 138/82  Pulse: 79  Temp: 98.7 F (37.1 C)  TempSrc: Oral  Weight: 255 lb (115.667 kg)  SpO2: 100%      Review of Systems  Constitutional: Negative for fever and fatigue.  HENT: Positive for ear pain. Negative for ear discharge.   Respiratory: Negative.   Cardiovascular: Negative.   Gastrointestinal: Positive for nausea. Negative for vomiting and  abdominal pain.  Genitourinary: Negative.   Neurological: Positive for headaches.  All other systems reviewed and are negative.      Objective:   Physical Exam  Constitutional: She is oriented to person, place, and time. She appears well-developed. No distress.  HENT:  Head: Normocephalic and atraumatic.  Right Ear: Tympanic membrane, external ear and ear canal normal.  Left Ear: Tympanic membrane, external ear and ear canal normal.  Nose: Right sinus exhibits no maxillary sinus tenderness and no frontal sinus tenderness. Left sinus exhibits no maxillary sinus tenderness and no frontal sinus tenderness.  Cardiovascular: Normal rate, regular rhythm and normal heart sounds.   No murmur heard. Pulmonary/Chest: Effort normal and breath sounds normal. No respiratory distress. She has no wheezes.  Abdominal: Soft. Bowel sounds are normal. She exhibits no distension and no mass. There is no tenderness.  Musculoskeletal: Normal range of motion. She exhibits no edema.  Neurological: She is alert and oriented to person, place, and time. No cranial nerve deficit.  Nursing note and vitals reviewed.      Assessment:     DM2 HTN Sinus HTN     Plan:     Check problem list.

## 2015-10-31 ENCOUNTER — Telehealth: Payer: Self-pay | Admitting: Family Medicine

## 2015-10-31 NOTE — Telephone Encounter (Signed)
HIPPA compliant message left.   NB: If she calls back let her know that are test result from yesterday was normal apart from the A1C which I already discussed with her during the visit yesterday.

## 2015-11-08 NOTE — Telephone Encounter (Signed)
Patient informed. 

## 2016-01-14 ENCOUNTER — Other Ambulatory Visit: Payer: Self-pay | Admitting: Family Medicine

## 2016-01-14 DIAGNOSIS — Z1231 Encounter for screening mammogram for malignant neoplasm of breast: Secondary | ICD-10-CM

## 2016-01-19 ENCOUNTER — Other Ambulatory Visit: Payer: Self-pay | Admitting: Family Medicine

## 2016-01-21 ENCOUNTER — Ambulatory Visit
Admission: RE | Admit: 2016-01-21 | Discharge: 2016-01-21 | Disposition: A | Discharge status: Home / Self Care | Payer: Managed Care, Other (non HMO) | Source: Ambulatory Visit | Attending: Family Medicine | Admitting: Family Medicine

## 2016-01-21 DIAGNOSIS — Z1231 Encounter for screening mammogram for malignant neoplasm of breast: Secondary | ICD-10-CM

## 2016-01-22 ENCOUNTER — Encounter: Payer: Self-pay | Admitting: Family Medicine

## 2016-02-15 ENCOUNTER — Ambulatory Visit (INDEPENDENT_AMBULATORY_CARE_PROVIDER_SITE_OTHER): Payer: Managed Care, Other (non HMO) | Admitting: Family Medicine

## 2016-02-15 ENCOUNTER — Encounter: Payer: Self-pay | Admitting: Family Medicine

## 2016-02-15 VITALS — BP 121/80 | HR 74 | Ht 66.0 in | Wt 241.0 lb

## 2016-02-15 DIAGNOSIS — E119 Type 2 diabetes mellitus without complications: Secondary | ICD-10-CM

## 2016-02-15 DIAGNOSIS — Z114 Encounter for screening for human immunodeficiency virus [HIV]: Secondary | ICD-10-CM | POA: Diagnosis not present

## 2016-02-15 DIAGNOSIS — E049 Nontoxic goiter, unspecified: Secondary | ICD-10-CM

## 2016-02-15 DIAGNOSIS — I10 Essential (primary) hypertension: Secondary | ICD-10-CM | POA: Diagnosis not present

## 2016-02-15 LAB — POCT GLYCOSYLATED HEMOGLOBIN (HGB A1C): HEMOGLOBIN A1C: 6.2

## 2016-02-15 NOTE — Assessment & Plan Note (Signed)
She stated she prefer to defer management to her endocrinologist. She will contact their office soon for follow up appointment. She is aware biopsy of her thyroid gland was recommended. All questions were answered.

## 2016-02-15 NOTE — Patient Instructions (Signed)
I am impressed about your weight loss. Congratulation. Please continue current regimen for your diabetes and blood pressure. Also do not relent on your exercise and diet control. I will see you back in 4 months.

## 2016-02-15 NOTE — Assessment & Plan Note (Signed)
A1C remains good. Continue current regimen.

## 2016-02-15 NOTE — Progress Notes (Signed)
Subjective:     Patient ID: Cynthia Wilson, female   DOB: 1966/06/03, 50 y.o.   MRN: [id number]  HPI HTN/DM2: Here for follow up. She has been compliant with her meds, diet and exercise. Denies any concern. Goitre:Denies symptoms of hypo or hyperthyroisidm. She stated she was meant to get biopsy of her thyroid through her endocrinologist but this was deferred based on the medication she was taking then. She stated she plan on call them for follow up appointment. HM: Here for routine health care.  Current Outpatient Prescriptions on File Prior to Visit  Medication Sig Dispense Refill  . hydrochlorothiazide (HYDRODIURIL) 25 MG tablet TAKE 1 TABLET BY MOUTH EVERY DAY 90 tablet 2  . lisinopril (PRINIVIL,ZESTRIL) 2.5 MG tablet Take 1 tablet (2.5 mg total) by mouth daily. 90 tablet 1  . loratadine (CLARITIN) 10 MG tablet Take 10 mg by mouth daily.    . metFORMIN (GLUCOPHAGE-XR) 500 MG 24 hr tablet Take 1 tablet (500 mg total) by mouth daily with breakfast. 90 tablet 2  . omeprazole (PRILOSEC) 20 MG capsule TAKE 1 CAPSULE (20 MG TOTAL) BY MOUTH DAILY AS NEEDED (HEART BURN). (Patient not taking: Reported on 02/15/2016) 30 capsule 2   No current facility-administered medications on file prior to visit.    Past Medical History:  Diagnosis Date  . GERD (gastroesophageal reflux disease)   . History of osteoarthritis    left knee  . Hypertension    under control with med., has been on med. x 5 yr.  . Immature cataract   . Medial meniscus tear 09/2013   right knee  . Non-insulin dependent type 2 diabetes mellitus ([place])   . Wears partial dentures    upper     Review of Systems  Respiratory: Negative.   Cardiovascular: Negative.   Gastrointestinal: Negative.   Genitourinary: Negative.   All other systems reviewed and are negative.  Vitals:   02/15/16 0849 02/15/16 0859  BP: 138/90 121/80  Pulse: 74   Weight: 241 lb (109.3 kg)   Height: 5\' 6"  (1.676 m)        Objective:   Physical  Exam  Constitutional: She appears well-developed. No distress.  Cardiovascular: Normal rate, regular rhythm and normal heart sounds.   No murmur heard. Pulmonary/Chest: Effort normal and breath sounds normal. No respiratory distress. She has no wheezes. She exhibits no tenderness.  Abdominal: Soft. Bowel sounds are normal. She exhibits no distension and no mass. There is no tenderness. There is no guarding.  Musculoskeletal: Normal range of motion. She exhibits no edema.  Nursing note and vitals reviewed.      Assessment:     DM2 HTN Go[facility] Health maintenance.    Plan:     Check problem list.  Note for her health maintenance I recommended pneumonia shot and PAP. She deferred both for now. She agreed on HIV screening which was done today.

## 2016-02-15 NOTE — Assessment & Plan Note (Signed)
BP ok. No changes for now to her meds.

## 2016-02-16 LAB — HIV ANTIBODY (ROUTINE TESTING W REFLEX): HIV: NONREACTIVE

## 2016-02-26 ENCOUNTER — Encounter: Payer: Self-pay | Admitting: Family Medicine

## 2016-02-26 ENCOUNTER — Ambulatory Visit (INDEPENDENT_AMBULATORY_CARE_PROVIDER_SITE_OTHER): Payer: Managed Care, Other (non HMO) | Admitting: Family Medicine

## 2016-02-26 VITALS — BP 152/85 | HR 80 | Temp 99.3°F | Resp 16 | Ht 66.0 in | Wt 236.6 lb

## 2016-02-26 DIAGNOSIS — H9209 Otalgia, unspecified ear: Secondary | ICD-10-CM

## 2016-02-26 DIAGNOSIS — J029 Acute pharyngitis, unspecified: Secondary | ICD-10-CM

## 2016-02-26 DIAGNOSIS — J069 Acute upper respiratory infection, unspecified: Secondary | ICD-10-CM | POA: Diagnosis not present

## 2016-02-26 LAB — POCT RAPID STREP A (OFFICE): Rapid Strep A Screen: NEGATIVE

## 2016-02-26 MED ORDER — GUAIFENESIN-CODEINE 100-10 MG/5ML PO SYRP: 5.0000 mL | ORAL_SOLUTION | Freq: Three times a day (TID) | ORAL | 0 refills | Status: DC | PRN

## 2016-02-26 NOTE — Patient Instructions (Signed)

## 2016-02-26 NOTE — Progress Notes (Signed)
Subjective:     Patient ID: Cynthia Wilson, female   DOB: [birth year], 50 y.o.   MRN: [id number]  Sore Throat   This is a new problem. The current episode started in the past 7 days (ST for 6 days). The problem has been unchanged. Neither side of throat is experiencing more pain than the other. Maximum temperature: Did not check her temp at home. She felt hot and cold at home. The pain is at a severity of 5/10. The pain is mild. Associated symptoms include congestion, coughing, ear pain and headaches. Pertinent negatives include no abdominal pain, diarrhea, ear discharge, shortness of breath, trouble swallowing or vomiting. Associated symptoms comments: Left ear ache. She started coughing two days ago. She has had exposure to strep. Exposure to: Exposure someone with strep at home. Treatments tried: Musinex, Alka Setlzer cough. The treatment provided moderate relief.  Cough  This is a new problem. The current episode started in the past 7 days (Started 2 days ago). The problem has been unchanged. The problem occurs constantly. The cough is non-productive. Associated symptoms include chest pain, ear pain, headaches and a sore throat. Pertinent negatives include no shortness of breath or wheezing. Associated symptoms comments: Chest pain only when she coughs. Difficulty breathing when her nose is stuffed up. Nothing aggravates the symptoms. She has tried OTC cough suppressant for the symptoms. The treatment provided mild relief. There is no history of asthma.   Current Outpatient Prescriptions on File Prior to Visit  Medication Sig Dispense Refill  . hydrochlorothiazide (HYDRODIURIL) 25 MG tablet TAKE 1 TABLET BY MOUTH EVERY DAY 90 tablet 2  . lisinopril (PRINIVIL,ZESTRIL) 2.5 MG tablet Take 1 tablet (2.5 mg total) by mouth daily. 90 tablet 1  . loratadine (CLARITIN) 10 MG tablet Take 10 mg by mouth daily.    . metFORMIN (GLUCOPHAGE-XR) 500 MG 24 hr tablet Take 1 tablet (500 mg total) by mouth daily with  breakfast. 90 tablet 2  . omeprazole (PRILOSEC) 20 MG capsule TAKE 1 CAPSULE (20 MG TOTAL) BY MOUTH DAILY AS NEEDED (HEART BURN). (Patient not taking: Reported on 02/15/2016) 30 capsule 2   No current facility-administered medications on file prior to visit.    Past Medical History:  Diagnosis Date  . GERD (gastroesophageal reflux disease)   . History of osteoarthritis    left knee  . Hypertension    under control with med., has been on med. x 5 yr.  . Immature cataract   . Medial meniscus tear 09/2013   right knee  . Non-insulin dependent type 2 diabetes mellitus (H[facility])   . Wears partial dentures    upper     Review of Systems  HENT: Positive for congestion, ear pain and sore throat. Negative for ear discharge and trouble swallowing.   Respiratory: Positive for cough. Negative for shortness of breath and wheezing.   Cardiovascular: Positive for chest pain.  Gastrointestinal: Negative for abdominal pain, diarrhea and vomiting.  Neurological: Positive for headaches.   Vitals:   02/26/16 1340  BP: (!) 152/85  Pulse: 80  Resp: 16  Temp: 99.3 F (37.4 C)  TempSrc: Oral  SpO2: 99%  Weight: 236 lb 9.6 oz (107.3 kg)  Height: 5\' 6"  (1.676 m)       Objective:   Physical Exam  Constitutional: She appears well-developed. No distress.  HENT:  Head: Normocephalic.  Right Ear: Tympanic membrane, external ear and ear canal normal. No drainage or tenderness.  Left Ear: Tympanic membrane, external ear and  ear canal normal. No drainage or tenderness.  Mouth/Throat: Uvula is midline, oropharynx is clear and moist and mucous membranes are normal.  Cardiovascular: Normal rate, regular rhythm and normal heart sounds.   No murmur heard. Pulmonary/Chest: Effort normal and breath sounds normal. No respiratory distress. She has no wheezes.  Lymphadenopathy:    She has no cervical adenopathy.    She has no axillary adenopathy.  Neurological: She is alert.       Assessment:      Pharyngitis   Cough Otalgia Plan:     Rapid strep Neg. Patient reassured this is likely viral illness. Her HEENT exam was otherwise normal. Cheratussin prescribed prn cough. Return precaution discussed. Tylenol as needed for pain.

## 2016-03-22 ENCOUNTER — Encounter (HOSPITAL_COMMUNITY): Payer: Self-pay | Admitting: Emergency Medicine

## 2016-03-22 ENCOUNTER — Ambulatory Visit (HOSPITAL_COMMUNITY)
Admission: EM | Admit: 2016-03-22 | Discharge: 2016-03-22 | Disposition: A | Discharge status: Home / Self Care | Payer: Managed Care, Other (non HMO) | Attending: Internal Medicine | Admitting: Internal Medicine

## 2016-03-22 DIAGNOSIS — J029 Acute pharyngitis, unspecified: Secondary | ICD-10-CM | POA: Diagnosis not present

## 2016-03-22 LAB — POCT RAPID STREP A: Streptococcus, Group A Screen (Direct): NEGATIVE

## 2016-03-22 MED ORDER — PENICILLIN V POTASSIUM 500 MG PO TABS: 500.0000 mg | ORAL_TABLET | Freq: Two times a day (BID) | ORAL | 0 refills | Status: DC

## 2016-03-22 NOTE — Discharge Instructions (Addendum)
Strep swab was negative today at urgent care and a throat culture is pending.  Result will be available in a few days.  The urgent care will call you if the result is positive and you can also get this information through the MyChart App (signup instructions in this discharge paper).  Recheck or followup with primary care provider if not starting to improve in a few days or for new fever >100.5.  Pres[facility] [address].

## 2016-03-22 NOTE — ED Triage Notes (Signed)
Patient reports sore throat since Thursday.  Took otc medicines yesterday.  Denies a fever.  No fever documented today in department.   Patient reports being seen at [facility] 2-3 weeks ago and treated with cough medicine for pharyngitis.  Patient says she did get well after that event.  Patient reports right side of neck soreness.

## 2016-03-22 NOTE — ED Provider Notes (Signed)
[place]    CSN: [id number] Arrival date & time: 03/22/16  1207  First Provider Contact:  First MD Initiated Contact with Patient 03/22/16 1323        History   Chief Complaint Chief Complaint  Patient presents with  . Sore Throat    HPI Cynthia Wilson is a 50 y.o. female. She presents today with severe sore throat, particularly on the right side, for the last 2 days. She has headache, no fever that she knows of. Runny nose. Maybe a little bit of cough, but not particularly bothersome. No nausea/vomiting, no diarrhea. Little bit achy, in her legs, but she attributes this to exercising yesterday. She had some sore throat a couple weeks ago, lasted about a week and resolved. Throat pain radiates into the right ear, when she swallows.    HPI  Past Medical History:  Diagnosis Date  . GERD (gastroesophageal reflux disease)   . History of osteoarthritis    left knee  . Hypertension    under control with med., has been on med. x 5 yr.  . Immature cataract   . Medial meniscus tear 09/2013   right knee  . Non-insulin dependent type 2 diabetes mellitus ([place])   . Wears partial dentures    upper    Patient Active Problem List   Diagnosis Date Noted  . Sinus headache 10/30/2015  . 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 DJD (degenerative joint disease), lumbar 12/29/2014  . Adnexal mass 04/21/2013  . 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 Osteoarthritis of left knee 02/11/2013  . Heart murmur 02/01/2013  . Diabetes Mellitus 06/17/2012  . Vertigo 02/07/2011  . KNEE PAIN, LEFT, CHRONIC 07/30/2009  . GERD 01/17/2009  . Goiter 08/27/2006  . Obesity 08/27/2006  . HYPERTENSION, BENIGN SYSTEMIC 08/27/2006    Past Surgical History:  Procedure Laterality Date  . CYST EXCISION  03/25/2007   resection peritoneal inclusion cyst  . KNEE ARTHROSCOPY Left 2011  . KNEE ARTHROSCOPY Right 10/21/2013   Procedure: ARTHROSCOPY RIGHT KNEE;  Surgeon: Alta Corning, MD;  Location: [facility];  Service: Orthopedics;  Laterality: Right;  medial , lateral and patella femoral chondromalsia and medial plica  . LAPAROSCOPY N/A 04/21/2013   Procedure: LAPAROSCOPY DIAGNOSTIC;  Surgeon: Lavonia Drafts, MD;  Location: [facility];  Service: Gynecology;  Laterality: N/A;  . LAPAROTOMY N/A 04/21/2013   Procedure: LAPAROTOMY;  Surgeon: Lavonia Drafts, MD;  Location: [facility];  Service: Gynecology;  Laterality: N/A;  . LYSIS OF ADHESION  08/28/2005; 03/25/2007; 04/21/2013  . SALPINGOOPHORECTOMY Left 04/21/2013   Procedure: SALPINGO OOPHORECTOMY;  Surgeon: Lavonia Drafts, MD;  Location: [facility];  Service: Gynecology;  Laterality: Left;  . TOTAL ABDOMINAL HYSTERECTOMY  08/28/2005  . TOTAL KNEE ARTHROPLASTY Left 02/11/2013   Procedure: LEFT TOTAL KNEE ARTHROPLASTY;  Surgeon: Alta Corning, MD;  Location: [facility];  Service: Orthopedics;  Laterality: Left;  NO COMPUTER  . UMBILICAL HERNIA REPAIR  1984  . UNILATERAL SALPINGECTOMY Right 08/28/2005    OB History    Gravida Para Term Preterm AB Living   6 4 4   2 4    SAB TAB Ectopic Multiple Live Births   2               Home Medications    Prior to Admission medications   Medication Sig Start Date End Date Taking? Authorizing Provider  guaiFENesin-codeine (CHERATUSSIN AC) 100-10 MG/5ML syrup Take 5 mLs by mouth 3 (three) times daily as  needed for cough. 02/26/16   Kinnie Feil, MD  hydrochlorothiazide (HYDRODIURIL) 25 MG tablet TAKE 1 TABLET BY MOUTH EVERY DAY 10/29/15   Kinnie Feil, MD  lisinopril (PRINIVIL,ZESTRIL) 2.5 MG tablet Take 1 tablet (2.5 mg total) by mouth daily. 10/30/15   Kinnie Feil, MD  loratadine (CLARITIN) 10 MG tablet Take 10 mg by mouth daily.    Historical Provider, MD  metFORMIN (GLUCOPHAGE-XR) 500 MG 24 hr tablet Take 1 tablet (500 mg total) by mouth daily with breakfast. 10/30/15   Kinnie Feil, MD  omeprazole (PRILOSEC) 20 MG capsule TAKE 1 CAPSULE (20  MG TOTAL) BY MOUTH DAILY AS NEEDED (HEART BURN). Patient not taking: Reported on 03/22/2016 01/21/16   Kinnie Feil, MD    Family History Family History  Problem Relation Age of Onset  . Diabetes Mother   . Hypertension Mother   . Hyperlipidemia Mother   . Emphysema Father     Social History Social History  Substance Use Topics  . Smoking status: Never Smoker  . Smokeless tobacco: Never Used  . Alcohol use No     Allergies   Iohexol; Ivp dye [iodinated diagnostic agents]; and Other   Review of Systems Review of Systems  All other systems reviewed and are negative.    Physical Exam Triage Vital Signs ED Triage Vitals  Enc Vitals Group     BP 03/22/16 1302 144/80     Pulse Rate 03/22/16 1302 68     Resp 03/22/16 1302 16     Temp 03/22/16 1302 98.4 F (36.9 C)     Temp Source 03/22/16 1302 Oral     SpO2 03/22/16 1302 100 %     Weight --      Height --      Pain Score 03/22/16 1319 4   Updated Vital Signs BP 144/80 (BP Location: Right Arm)   Pulse 68   Temp 98.4 F (36.9 C) (Oral)   Resp 16   SpO2 100%  Physical Exam  Constitutional: She is oriented to person, place, and time. No distress.  Alert, nicely groomed  HENT:  Head: Atraumatic.  Bilateral TMs are without erythema, right TM is translucent, left TM is moderately dull. Moderate nasal congestion bilaterally Marked tonsillar enlargement bilaterally, with erythema and necrotic exudates  Eyes:  Conjugate gaze, no eye redness/drainage  Neck: Neck supple.  Cardiovascular: Normal rate.   Pulmonary/Chest: No respiratory distress.  Lungs clear, symmetric breath sounds  Abdominal: She exhibits no distension.  Musculoskeletal: Normal range of motion.  No leg swelling  Neurological: She is alert and oriented to person, place, and time.  Skin: Skin is warm and dry.  No cyanosis  Nursing note and vitals reviewed.    UC Treatments / Results  Labs  Results for orders placed or performed during the  hospital encounter of 03/22/16  POCT rapid strep A [facility])  Result Value Ref Range   Streptococcus, Group A Screen (Direct) NEGATIVE NEGATIVE    Procedures Procedures (including critical care time)  Final Clinical Impressions(s) / UC Diagnoses   Final diagnoses:  Pharyngitis   Strep swab was negative today at urgent care and a throat culture is pending.  Result will be available in a few days.  The urgent care will call you if the result is positive and you can also get this information through the MyChart App (signup instructions in this discharge paper).  Recheck or followup with primary care provider if not starting  to improve in a few days or for new fever >100.5.  [facility] [address]    New Prescriptions Discharge Medication List as of 03/22/2016  1:47 PM    START taking these medications   Details  penicillin v potassium (VEETID) 500 MG tablet Take 1 tablet (500 mg total) by mouth 2 (two) times daily., Starting Sat 03/22/2016, Print         Sherlene Shams, MD 03/24/16 1215

## 2016-03-24 LAB — CULTURE, GROUP A STREP (THRC)

## 2016-04-02 ENCOUNTER — Ambulatory Visit (INDEPENDENT_AMBULATORY_CARE_PROVIDER_SITE_OTHER): Payer: Managed Care, Other (non HMO) | Admitting: Student

## 2016-04-02 VITALS — BP 143/76 | HR 72 | Temp 97.7°F | Wt 234.4 lb

## 2016-04-02 DIAGNOSIS — J028 Acute pharyngitis due to other specified organisms: Secondary | ICD-10-CM | POA: Diagnosis not present

## 2016-04-02 DIAGNOSIS — B9789 Other viral agents as the cause of diseases classified elsewhere: Secondary | ICD-10-CM

## 2016-04-02 DIAGNOSIS — J029 Acute pharyngitis, unspecified: Secondary | ICD-10-CM

## 2016-04-02 NOTE — Assessment & Plan Note (Signed)
Signs and symptoms including exam suggestive for viral pharyngitis. One out of 5 on Centor's criteria for strep pharyngitis. Otherwise, lung exam within normal limits. Reassured patient. -Recommended supportive therapy including good hydration and Tylenol/ibuprofen as needed for headache -Discussed return precautions.

## 2016-04-02 NOTE — Patient Instructions (Signed)
It was great seeing you today! We have addressed the following issues today  1. Sore throat: This is likely viral pharyngitis. Your exam doesn't suggest strep throat. I recommend ibuprofen or Tylenol as needed for headache. I also recommend good hydration with plenty of water/Gatorade. Please come back and see [place] if your symptoms are worse or you have trouble breathing or chest pain.    If we did any lab work today, and the results require attention, either me or my nurse will get in touch with you. If everything is normal, you will get a letter in mail. If you don't hear from [place] in two weeks, please give [place] a call. Otherwise, I look forward to talking with you again at our next visit. If you have any questions or concerns before then, please call the clinic at [phone].  Please bring all your medications to every doctors visit   Sign up for My Chart to have easy access to your labs results, and communication with your Primary care physician.    Please check-out at the front desk before leaving the clinic.   Take Care,

## 2016-04-02 NOTE — Progress Notes (Signed)
   Subjective:    Patient ID: Cynthia Wilson is a 50 y.o. old female.  HPI #Sore throat: Reports waking up with sore throat today. She also has had congestion since this morning. Reports headache and right ear pain. Describes her headache as tense all over. Denies visual changes or photophobia. No fever.  No runny nose. Reports intermittent dry cough. Denies shortness of breath, chest pain, nausea, vomiting or diarrhea.  Reports sick contact at work. She says a lot of people coming to work while sick.  Treated for Strep throat about two weeks ago.    PMH: reviewed  SH: denies smoking.   Review of Systems Per HPI Objective:   Vitals:   04/02/16 1628  BP: (!) 143/76  Pulse: 72  Temp: 97.7 F (36.5 C)  TempSrc: Oral  SpO2: 100%  Weight: 234 lb 6.4 oz (106.3 kg)    GEN: appears well, no apparent distress. Ears: normal TM and ear canal Nares: Mildly swollen inferior turbinates right greater than left Oropharynx: mmm without erythema or exudation CVS: RRR, normal s1 and s2, no murmurs, no edema RESP: no increased work of breathing, good air movement bilaterally, no crackles or wheeze HEM: Negative for cervical or periauricular lymphadenopathy NEURO: alert and oriented appropriately, no gross defecits  PSYCH: appropriate mood and affect     Assessment & Plan:  Acute viral pharyngitis Signs and symptoms including exam suggestive for viral pharyngitis. One out of 5 on Centor's criteria for strep pharyngitis. Otherwise, lung exam within normal limits. Reassured patient. -Recommended supportive therapy including good hydration and Tylenol/ibuprofen as needed for headache -Discussed return precautions.   Patient declined flu shot today saying she will get it at place of work.

## 2016-04-03 ENCOUNTER — Telehealth: Payer: Self-pay | Admitting: Family Medicine

## 2016-04-03 NOTE — Telephone Encounter (Signed)
Use Tylenol or Ibuprofen as needed for Sore Throat.  Cough Syrup with Codeine is indicated only for cough and congestion, not Sore Throat.  F/U soon if symptoms persists or worsens.

## 2016-04-03 NOTE — Telephone Encounter (Signed)
Patient is aware of directions from MD. Cynthia Wilson

## 2016-04-03 NOTE — Telephone Encounter (Signed)
Will forward to MD to advise.  Patient will likely have relief from her sore throat when her cough is controlled, but will verify with provider first. [facility]

## 2016-04-03 NOTE — Telephone Encounter (Signed)
Pt has codeine cough syrup and wants to know if that will help with sore throat. Please advise. Thanks! ep

## 2016-04-07 ENCOUNTER — Encounter (HOSPITAL_COMMUNITY): Payer: Self-pay | Admitting: Emergency Medicine

## 2016-04-07 ENCOUNTER — Ambulatory Visit (HOSPITAL_COMMUNITY)
Admission: EM | Admit: 2016-04-07 | Discharge: 2016-04-07 | Disposition: A | Discharge status: Home / Self Care | Payer: Managed Care, Other (non HMO) | Attending: Internal Medicine | Admitting: Internal Medicine

## 2016-04-07 DIAGNOSIS — J019 Acute sinusitis, unspecified: Secondary | ICD-10-CM

## 2016-04-07 MED ORDER — AMOXICILLIN-POT CLAVULANATE 875-125 MG PO TABS: 1.0000 | ORAL_TABLET | Freq: Two times a day (BID) | ORAL | 0 refills | Status: AC

## 2016-04-07 MED ORDER — TRIAMCINOLONE ACETONIDE 55 MCG/ACT NA AERO: 2.0000 | INHALATION_SPRAY | Freq: Every day | NASAL | 0 refills | Status: DC

## 2016-04-07 MED ORDER — PREDNISONE 50 MG PO TABS: 50.0000 mg | ORAL_TABLET | Freq: Every day | ORAL | 0 refills | Status: DC

## 2016-04-07 NOTE — ED Provider Notes (Signed)
[facility]    CSN: [id number] Arrival date & time: 04/07/16  1827     History   Chief Complaint Chief Complaint  Patient presents with  . Sore Throat  . Headache  . Cough    HPI Cynthia Wilson is a 50 y.o. female. Patient presents with continuing sinus congestion and drainage, hoarseness, cough. Throat continues to be sore. Has headache, no fever. No achiness. No vomiting, no diarrhea. Missed work Thursday and Friday because of symptoms.    HPI  Past Medical History:  Diagnosis Date  . GERD (gastroesophageal reflux disease)   . History of osteoarthritis    left knee  . Hypertension    under control with med., has been on med. x 5 yr.  . Immature cataract   . Medial meniscus tear 09/2013   right knee  . Non-insulin dependent type 2 diabetes mellitus ([place])   . Wears partial dentures    upper    Patient Active Problem List   Diagnosis Date Noted  . Acute viral pharyngitis 04/02/2016  . Sinus headache 10/30/2015  . 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 DJD (degenerative joint disease), lumbar 12/29/2014  . Adnexal mass 04/21/2013  . 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 Osteoarthritis of left knee 02/11/2013  . Heart murmur 02/01/2013  . Diabetes Mellitus 06/17/2012  . Vertigo 02/07/2011  . KNEE PAIN, LEFT, CHRONIC 07/30/2009  . GERD 01/17/2009  . Goiter 08/27/2006  . Obesity 08/27/2006  . HYPERTENSION, BENIGN SYSTEMIC 08/27/2006    Past Surgical History:  Procedure Laterality Date  . CYST EXCISION  03/25/2007   resection peritoneal inclusion cyst  . KNEE ARTHROSCOPY Left 2011  . KNEE ARTHROSCOPY Right 10/21/2013   Procedure: ARTHROSCOPY RIGHT KNEE;  Surgeon: Alta Corning, MD;  Location: [facility];  Service: Orthopedics;  Laterality: Right;  medial , lateral and patella femoral chondromalsia and medial plica  . LAPAROSCOPY N/A 04/21/2013   Procedure: LAPAROSCOPY DIAGNOSTIC;  Surgeon: Lavonia Drafts, MD;   Location: [facility];  Service: Gynecology;  Laterality: N/A;  . LAPAROTOMY N/A 04/21/2013   Procedure: LAPAROTOMY;  Surgeon: Lavonia Drafts, MD;  Location: [facility];  Service: Gynecology;  Laterality: N/A;  . LYSIS OF ADHESION  08/28/2005; 03/25/2007; 04/21/2013  . SALPINGOOPHORECTOMY Left 04/21/2013   Procedure: SALPINGO OOPHORECTOMY;  Surgeon: Lavonia Drafts, MD;  Location: [facility];  Service: Gynecology;  Laterality: Left;  . TOTAL ABDOMINAL HYSTERECTOMY  08/28/2005  . TOTAL KNEE ARTHROPLASTY Left 02/11/2013   Procedure: LEFT TOTAL KNEE ARTHROPLASTY;  Surgeon: Alta Corning, MD;  Location: [facility];  Service: Orthopedics;  Laterality: Left;  NO COMPUTER  . UMBILICAL HERNIA REPAIR  1984  . UNILATERAL SALPINGECTOMY Right 08/28/2005    OB History    Gravida Para Term Preterm AB Living   6 4 4   2 4    SAB TAB Ectopic Multiple Live Births   2               Home Medications    Prior to Admission medications   Medication Sig Start Date End Date Taking? Authorizing Provider  amoxicillin-clavulanate (AUGMENTIN) 875-125 MG tablet Take 1 tablet by mouth 2 (two) times daily. 04/07/16 04/17/16  Sherlene Shams, MD  guaiFENesin-codeine (CHERATUSSIN AC) 100-10 MG/5ML syrup Take 5 mLs by mouth 3 (three) times daily as needed for cough. 02/26/16   Kinnie Feil, MD  hydrochlorothiazide (HYDRODIURIL) 25 MG tablet TAKE 1 TABLET BY MOUTH EVERY DAY 10/29/15   Kinnie Feil,  MD  lisinopril (PRINIVIL,ZESTRIL) 2.5 MG tablet Take 1 tablet (2.5 mg total) by mouth daily. 10/30/15   Kinnie Feil, MD  loratadine (CLARITIN) 10 MG tablet Take 10 mg by mouth daily.    Historical Provider, MD  metFORMIN (GLUCOPHAGE-XR) 500 MG 24 hr tablet Take 1 tablet (500 mg total) by mouth daily with breakfast. 10/30/15   Kinnie Feil, MD  predniSONE (DELTASONE) 50 MG tablet Take 1 tablet (50 mg total) by mouth daily. 04/07/16   Sherlene Shams, MD  triamcinolone (NASACORT AQ) 55 MCG/ACT AERO nasal inhaler Place 2 sprays into  the nose daily. 04/07/16   Sherlene Shams, MD    Family History Family History  Problem Relation Age of Onset  . Diabetes Mother   . Hypertension Mother   . Hyperlipidemia Mother   . Emphysema Father     Social History Social History  Substance Use Topics  . Smoking status: Never Smoker  . Smokeless tobacco: Never Used  . Alcohol use No     Allergies   Iohexol; Ivp dye [iodinated diagnostic agents]; and Other   Review of Systems Review of Systems  All other systems reviewed and are negative.    Physical Exam Triage Vital Signs ED Triage Vitals  Enc Vitals Group     BP 04/07/16 2006 139/89     Pulse --      Resp 04/07/16 2001 18     Temp --      Temp src --      SpO2 04/07/16 2001 100 %     Weight --      Height --      Pain Score 04/07/16 2005 5   Updated Vital Signs BP 139/89   Resp 18   SpO2 100%  Physical Exam  Constitutional: She is oriented to person, place, and time. No distress.  Alert, nicely groomed Voice is very hoarse  HENT:  Head: Atraumatic.  Bilateral TMs are dull, no erythema Marked nasal congestion bilaterally Throat is red with postnasal drainage  Eyes:  Conjugate gaze, no eye redness/drainage  Neck: Neck supple.  Cardiovascular: Normal rate and regular rhythm.   Pulmonary/Chest: No respiratory distress. She has no wheezes. She has no rales.  Lungs clear, symmetric breath sounds  Abdominal: She exhibits no distension.  Musculoskeletal: Normal range of motion.  No leg swelling  Neurological: She is alert and oriented to person, place, and time.  Skin: Skin is warm and dry.  No cyanosis  Nursing note and vitals reviewed.    UC Treatments / Results   Procedures Procedures (including critical care time)      None today  Final Clinical Impressions(s) / UC Diagnoses   Final diagnoses:  Acute sinusitis with symptoms > 10 days   Followup with primary care provider Dr Gwendlyn Deutscher or ENT for further evaluation if sinus  congestion/drainage persists.   New Prescriptions Discharge Medication List as of 04/07/2016  8:30 PM    START taking these medications   Details  amoxicillin-clavulanate (AUGMENTIN) 875-125 MG tablet Take 1 tablet by mouth 2 (two) times daily., Starting Mon 04/07/2016, Until Thu 04/17/2016, Normal    predniSONE (DELTASONE) 50 MG tablet Take 1 tablet (50 mg total) by mouth daily., Starting Mon 04/07/2016, Normal    triamcinolone (NASACORT AQ) 55 MCG/ACT AERO nasal inhaler Place 2 sprays into the nose daily., Starting Mon 04/07/2016, Normal         Sherlene Shams, MD 04/10/16 [phone]

## 2016-04-07 NOTE — Discharge Instructions (Addendum)
Followup with primary care provider Dr Gwendlyn Deutscher or ENT for further evaluation if sinus congestion/drainage persists.

## 2016-04-07 NOTE — ED Triage Notes (Signed)
Patient presents today with a sore throat, headache and cough.

## 2016-04-18 ENCOUNTER — Telehealth: Payer: Self-pay | Admitting: Family Medicine

## 2016-04-18 NOTE — Telephone Encounter (Signed)
Patient need to be reassessed for this.

## 2016-04-18 NOTE — Telephone Encounter (Signed)
Pt is finishing the antibotic she was given at an urgent care sept 23. Sinus infection had gotten better but it is still there. She would like to have the antibotic doxycycline.  She has taken that before  And it seems to work better. Please adviese

## 2016-04-18 NOTE — Telephone Encounter (Signed)
Will forward to MD to advise. Jazmin Hartsell,CMA  

## 2016-04-19 ENCOUNTER — Other Ambulatory Visit: Payer: Self-pay | Admitting: Family Medicine

## 2016-04-21 NOTE — Telephone Encounter (Signed)
Patient states that she was seen at her ENT provider this morning and was given the abx requested due them helping more than what she was originally on.  [employer]

## 2016-06-09 ENCOUNTER — Emergency Department (HOSPITAL_COMMUNITY)
Admission: EM | Admit: 2016-06-09 | Discharge: 2016-06-09 | Disposition: A | Discharge status: Home / Self Care | Payer: Managed Care, Other (non HMO) | Attending: Emergency Medicine | Admitting: Emergency Medicine

## 2016-06-09 ENCOUNTER — Encounter (HOSPITAL_COMMUNITY): Payer: Self-pay

## 2016-06-09 DIAGNOSIS — Z5321 Procedure and treatment not carried out due to patient leaving prior to being seen by health care provider: Secondary | ICD-10-CM | POA: Insufficient documentation

## 2016-06-09 DIAGNOSIS — R109 Unspecified abdominal pain: Secondary | ICD-10-CM | POA: Insufficient documentation

## 2016-06-09 LAB — BASIC METABOLIC PANEL
Anion gap: 6 (ref 5–15)
BUN: 14 mg/dL (ref 6–20)
CHLORIDE: 104 mmol/L (ref 101–111)
CO2: 29 mmol/L (ref 22–32)
Calcium: 9.1 mg/dL (ref 8.9–10.3)
Creatinine, Ser: 0.88 mg/dL (ref 0.44–1.00)
GFR calc Af Amer: >60 mL/min (ref >60)
GFR calc non Af Amer: >60 mL/min (ref >60)
GLUCOSE: 114 mg/dL — AB (ref 65–99)
POTASSIUM: 3.7 mmol/L (ref 3.5–5.1)
SODIUM: 139 mmol/L (ref 135–145)

## 2016-06-09 LAB — URINALYSIS, ROUTINE W REFLEX MICROSCOPIC
Bilirubin Urine: NEGATIVE
GLUCOSE, UA: NEGATIVE mg/dL
KETONES UR: NEGATIVE mg/dL
LEUKOCYTES UA: NEGATIVE
NITRITE: NEGATIVE
PROTEIN: NEGATIVE mg/dL
Specific Gravity, Urine: 1.02 (ref 1.005–1.030)
pH: 5.5 (ref 5.0–8.0)

## 2016-06-09 LAB — CBC
HEMATOCRIT: 36.8 % (ref 36.0–46.0)
Hemoglobin: 11.9 g/dL — ABNORMAL LOW (ref 12.0–15.0)
MCH: 25.7 pg — AB (ref 26.0–34.0)
MCHC: 32.3 g/dL (ref 30.0–36.0)
MCV: 79.5 fL (ref 78.0–100.0)
Platelets: 346 10*3/uL (ref 150–400)
RBC: 4.63 MIL/uL (ref 3.87–5.11)
RDW: 14.1 % (ref 11.5–15.5)
WBC: 8.7 10*3/uL (ref 4.0–10.5)

## 2016-06-09 LAB — URINALYSIS, MICROSCOPIC (REFLEX)

## 2016-06-09 NOTE — ED Notes (Signed)
Pt called for again for vital sign reassessment. No answer no response. X3

## 2016-06-09 NOTE — ED Notes (Signed)
Rounded on patient, wanted an update regarding wait times. Provided and apologized for wait.

## 2016-06-09 NOTE — ED Notes (Signed)
Pt called for x3 for vital sign reassessment. No answer, no response.

## 2016-06-09 NOTE — ED Triage Notes (Signed)
Per Pt, Pt is coming from home with complaints of right sided flank pain that started on Saturday. Denies any nausea or vomiting, but reports pain increases with laughing and deep breathing. Pt reports the pain is intermittent with throbbing "contractions." Reports burning with urination on day one, but has not had any burning since then.

## 2016-06-09 NOTE — ED Notes (Signed)
Unable to locate in lobby. 

## 2016-06-10 ENCOUNTER — Ambulatory Visit (INDEPENDENT_AMBULATORY_CARE_PROVIDER_SITE_OTHER): Payer: Managed Care, Other (non HMO) | Admitting: Family Medicine

## 2016-06-10 ENCOUNTER — Ambulatory Visit: Payer: Managed Care, Other (non HMO) | Admitting: Family Medicine

## 2016-06-10 ENCOUNTER — Encounter: Payer: Self-pay | Admitting: Family Medicine

## 2016-06-10 VITALS — BP 132/86 | Temp 99.0°F | Ht 66.0 in | Wt 240.0 lb

## 2016-06-10 DIAGNOSIS — N3090 Cystitis, unspecified without hematuria: Secondary | ICD-10-CM | POA: Diagnosis not present

## 2016-06-10 MED ORDER — CEPHALEXIN 500 MG PO CAPS: 500.0000 mg | ORAL_CAPSULE | Freq: Two times a day (BID) | ORAL | 0 refills | Status: DC

## 2016-06-10 MED ORDER — PHENAZOPYRIDINE HCL 200 MG PO TABS: 200.0000 mg | ORAL_TABLET | Freq: Three times a day (TID) | ORAL | 0 refills | Status: DC | PRN

## 2016-06-10 NOTE — Progress Notes (Signed)
Subjective: [id number] pain [id number] Cynthia Wilson is a 50 y.o. female presenting to clinic today for same day appointment. PCP: Andrena Mews, MD Concerns today include:  1. Abdominal pain She reports dysuria that started Wednesday of last week.  She reports abdominal pain started Saturday afternoon.  Denies nausea, vomiting, fevers, hematuria.  Does not have a menstrual cycle so no bleeding from vagina or rectum.  No abnormal vaginal discharge.  No recent sexual activity.  Patient is a DM2.  She did not stay in ED yesterday for results or treatment.  She had UA and labs done.  Social History Reviewed. FamHx and MedHx reviewed.  Please see EMR. ROS: Per HPI  Objective: Office vital signs reviewed. BP 132/86   Temp 99 F (37.2 C) (Oral)   Ht 5\' 6"  (1.676 m)   Wt 240 lb (108.9 kg)   SpO2 94%   BMI 38.74 kg/m   Physical Examination:  General: Awake, alert, overweight, No acute distress GI: soft, non-tender, non-distended, bowel sounds present x4, no hepatomegaly, no splenomegaly GU: +suprapubic TTP, no CVA TTP  Results for orders placed or performed during the hospital encounter of 06/09/16 (from the past 24 hour(s))  Basic metabolic panel     Status: Abnormal   Collection Time: 06/09/16  6:03 PM  Result Value Ref Range   Sodium 139 135 - 145 mmol/L   Potassium 3.7 3.5 - 5.1 mmol/L   Chloride 104 101 - 111 mmol/L   CO2 29 22 - 32 mmol/L   Glucose, Bld 114 (H) 65 - 99 mg/dL   BUN 14 6 - 20 mg/dL   Creatinine, Ser 0.88 0.44 - 1.00 mg/dL   Calcium 9.1 8.9 - 10.3 mg/dL   GFR calc non Af Amer >60 >60 mL/min   GFR calc Af Amer >60 >60 mL/min   Anion gap 6 5 - 15  CBC     Status: Abnormal   Collection Time: 06/09/16  6:03 PM  Result Value Ref Range   WBC 8.7 4.0 - 10.5 K/uL   RBC 4.63 3.87 - 5.11 MIL/uL   Hemoglobin 11.9 (L) 12.0 - 15.0 g/dL   HCT 36.8 36.0 - 46.0 %   MCV 79.5 78.0 - 100.0 fL   MCH 25.7 (L) 26.0 - 34.0 pg   MCHC 32.3 30.0 - 36.0 g/dL   RDW 14.1 11.5  - 15.5 %   Platelets 346 150 - 400 K/uL  Urinalysis, Routine w reflex microscopic- may I&O cath if menses     Status: Abnormal   Collection Time: 06/09/16  6:06 PM  Result Value Ref Range   Color, Urine YELLOW YELLOW   APPearance CLEAR CLEAR   Specific Gravity, Urine 1.020 1.005 - 1.030   pH 5.5 5.0 - 8.0   Glucose, UA NEGATIVE NEGATIVE mg/dL   Hgb urine dipstick MODERATE (A) NEGATIVE   Bilirubin Urine NEGATIVE NEGATIVE   Ketones, ur NEGATIVE NEGATIVE mg/dL   Protein, ur NEGATIVE NEGATIVE mg/dL   Nitrite NEGATIVE NEGATIVE   Leukocytes, UA NEGATIVE NEGATIVE  Urinalysis, Microscopic (reflex)     Status: Abnormal   Collection Time: 06/09/16  6:06 PM  Result Value Ref Range   RBC / HPF 6-30 0 - 5 RBC/hpf   WBC, UA 0-5 0 - 5 WBC/hpf   Bacteria, UA FEW (A) NONE SEEN   Squamous Epithelial / LPF 6-30 (A) NONE SEEN     Assessment/ Plan: 50 y.o. female   1. Cystitis.  No evidence of pyelonephritis or other  abdominal processes at this time.  ED labs and UA reviewed.  CBC normal, CMP unremarkable.  UA with blood and bacteria.  Unfortunately, no culture obtained and patient has just urinated so cannot give sample today.  Will treat with Cephalosporin.  If no improvement/worsening symptoms, discussed return to clinic for recollection. - Discussed use of pyridium. - phenazopyridine (PYRIDIUM) 200 MG tablet; Take 1 tablet (200 mg total) by mouth 3 (three) times daily as needed for pain.  Dispense: 10 tablet; Refill: 0 - cephALEXin (KEFLEX) 500 MG capsule; Take 1 capsule (500 mg total) by mouth 2 (two) times daily.  Dispense: 14 capsule; Refill: 0 - Hydration - Follow up in 3 days if no improvement in symptoms.  Follow up if worsening symptoms or development of nausea, vomiting, fevers, back pain.   Janora Norlander, DO PGY-3, [facility]

## 2016-06-10 NOTE — Patient Instructions (Addendum)
Take the Pyridium (for bladder pain) up to 3 times daily as needed for bladder pain.  Only take for the next 2 days, then stop.  If no improvement in symptoms in 3 days, come back for reevaluation.  Take your antibiotic daily as directed.   Urinary Tract Infection, Adult Introduction A urinary tract infection (UTI) is an infection of any part of the urinary tract. The urinary tract includes the:  Kidneys.  Ureters.  Bladder.  Urethra. These organs make, store, and get rid of pee (urine) in the body. Follow these instructions at home:  Take over-the-counter and prescription medicines only as told by your doctor.  If you were prescribed an antibiotic medicine, take it as told by your doctor. Do not stop taking the antibiotic even if you start to feel better.  Avoid the following drinks:  Alcohol.  Caffeine.  Tea.  Carbonated drinks.  Drink enough fluid to keep your pee clear or pale yellow.  Keep all follow-up visits as told by your doctor. This is important.  Make sure to:  Empty your bladder often and completely. Do not to hold pee for long periods of time.  Empty your bladder before and after sex.  Wipe from front to back after a bowel movement if you are female. Use each tissue one time when you wipe. Contact a doctor if:  You have back pain.  You have a fever.  You feel sick to your stomach (nauseous).  You throw up (vomit).  Your symptoms do not get better after 3 days.  Your symptoms go away and then come back. Get help right away if:  You have very bad back pain.  You have very bad lower belly (abdominal) pain.  You are throwing up and cannot keep down any medicines or water. This information is not intended to replace advice given to you by your health care provider. Make sure you discuss any questions you have with your health care provider. Document Released: 12/03/2007 Document Revised: 11/22/2015 Document Reviewed: 05/07/2015  2017  Elsevier

## 2016-07-16 ENCOUNTER — Other Ambulatory Visit: Payer: Self-pay | Admitting: Family Medicine

## 2016-07-19 ENCOUNTER — Encounter (HOSPITAL_COMMUNITY): Payer: Self-pay

## 2016-07-19 ENCOUNTER — Ambulatory Visit (HOSPITAL_COMMUNITY)
Admission: EM | Admit: 2016-07-19 | Discharge: 2016-07-19 | Disposition: A | Discharge status: Home / Self Care | Payer: Managed Care, Other (non HMO) | Attending: Family Medicine | Admitting: Family Medicine

## 2016-07-19 DIAGNOSIS — H9202 Otalgia, left ear: Secondary | ICD-10-CM

## 2016-07-19 NOTE — ED Triage Notes (Signed)
Patient presents to [facility] with complaints of ear pain in left ear which is currently moved to right ear and has caused some nasal drainage and sore throat since Tuesday 07/16/2015, patient has taken Mucinex and Tylenol sinus to treat symptoms

## 2016-07-19 NOTE — Discharge Instructions (Signed)
I do not see an ear infection on exam today. Please take ibuprofen for pain relief. Continue to monitor symptoms, and follow up with you primary care doctor if your ear pain persisted.  Hope you get better.

## 2016-07-19 NOTE — ED Provider Notes (Signed)
CSN: [id number]     Arrival date & time 07/19/16  1201 History   First MD Initiated Contact with Patient 07/19/16 1225     Chief Complaint  Patient presents with  . Otalgia   (Consider location/radiation/quality/duration/timing/severity/associated sxs/prior Treatment) Patient is a well-appearing 51 year old female, presents today for a left ear pain of 4 days' duration. She reports the ear pain to be intermittent. Patient denies tinnitus, ear discharge or hearing loss. Patient does report a frequent history of ear infections in the past. Patient now also complaints of a right earache, nasal congestion, coughing, running nose and sore throat onset yesterday.   Blood pressure noted to be elevated today. Patient does have a history of hypertension reporting that her blood pressures normally well-controlled at home. BP repeat is 150/89.       Past Medical History:  Diagnosis Date  . GERD (gastroesophageal reflux disease)   . History of osteoarthritis    left knee  . Hypertension    under control with med., has been on med. x 5 yr.  . Immature cataract   . Medial meniscus tear 09/2013   right knee  . Non-insulin dependent type 2 diabetes mellitus ([place])   . Wears partial dentures    upper   Past Surgical History:  Procedure Laterality Date  . CYST EXCISION  03/25/2007   resection peritoneal inclusion cyst  . KNEE ARTHROSCOPY Left 2011  . 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 LAPAROSCOPY N/A 04/21/2013   Procedure: LAPAROSCOPY DIAGNOSTIC;  Surgeon: Lavonia Drafts, MD;  Location: [facility];  Service: Gynecology;  Laterality: N/A;  . LAPAROTOMY N/A 04/21/2013   Procedure: LAPAROTOMY;  Surgeon: Lavonia Drafts, MD;  Location: [facility];  Service: Gynecology;   Laterality: N/A;  . LYSIS OF ADHESION  08/28/2005; 03/25/2007; 04/21/2013  . SALPINGOOPHORECTOMY Left 04/21/2013   Procedure: SALPINGO OOPHORECTOMY;  Surgeon: Lavonia Drafts, MD;  Location: [facility];  Service: Gynecology;  Laterality: Left;  . TOTAL ABDOMINAL HYSTERECTOMY  08/28/2005  . TOTAL KNEE ARTHROPLASTY Left 02/11/2013   Procedure: LEFT TOTAL KNEE ARTHROPLASTY;  Surgeon: Alta Corning, MD;  Location: [place];  Service: Orthopedics;  Laterality: Left;  NO COMPUTER  . UMBILICAL HERNIA REPAIR  1984  . UNILATERAL SALPINGECTOMY Right 08/28/2005   Family History  Problem Relation Age of Onset  . Diabetes Mother   . Hypertension Mother   . Hyperlipidemia Mother   . Emphysema Father    Social History  Substance Use Topics  . Smoking status: Never Smoker  . Smokeless tobacco: Never Used  . Alcohol use No   OB History    Gravida Para Term Preterm AB Living   6 4 4   2 4    SAB TAB Ectopic Multiple Live Births   2             Review of Systems  Constitutional: Negative for chills, fatigue and fever.  HENT: Positive for congestion, ear pain, rhinorrhea and sore throat. Negative for sinus pain, sinus pressure and tinnitus.        -hearing loss   Respiratory: Positive for cough. Negative for shortness of breath.   Cardiovascular: Negative for chest pain and palpitations.  Gastrointestinal: Negative for abdominal pain, nausea and vomiting.  Neurological: Positive for headaches.    Allergies  Iohexol; Ivp  dye [iodinated diagnostic agents]; and Other  Home Medications   Prior to Admission medications   Medication Sig Start Date End Date Taking? Authorizing Provider  hydrochlorothiazide (HYDRODIURIL) 25 MG tablet TAKE 1 TABLET BY MOUTH EVERY DAY 07/16/16  Yes Kinnie Feil, MD  lisinopril (PRINIVIL,ZESTRIL) 2.5 MG tablet TAKE 1 TABLET EVERY DAY 04/21/16  Yes Kinnie Feil, MD  loratadine (CLARITIN) 10 MG tablet Take 10 mg by mouth daily.   Yes Historical Provider, MD   metFORMIN (GLUCOPHAGE-XR) 500 MG 24 hr tablet TAKE 1 TABLET BY MOUTH DAILY WITH BREAKFAST 07/16/16  Yes Kinnie Feil, MD  cephALEXin (KEFLEX) 500 MG capsule Take 1 capsule (500 mg total) by mouth 2 (two) times daily. 06/10/16   Ashly Windell Moulding, DO  guaiFENesin-codeine (CHERATUSSIN AC) 100-10 MG/5ML syrup Take 5 mLs by mouth 3 (three) times daily as needed for cough. 02/26/16   Kinnie Feil, MD  phenazopyridine (PYRIDIUM) 200 MG tablet Take 1 tablet (200 mg total) by mouth 3 (three) times daily as needed for pain. 06/10/16   Ashly Windell Moulding, DO  predniSONE (DELTASONE) 50 MG tablet Take 1 tablet (50 mg total) by mouth daily. 04/07/16   Sherlene Shams, MD  triamcinolone (NASACORT AQ) 55 MCG/ACT AERO nasal inhaler Place 2 sprays into the nose daily. 04/07/16   Sherlene Shams, MD   Meds Ordered and Administered this Visit  Medications - No data to display  BP 162/84 (BP Location: Right Arm)   Pulse 74   Temp 98.5 F (36.9 C) (Oral)   Resp 16   SpO2 98%  No data found.   Physical Exam  Constitutional: She appears well-developed and well-nourished.  HENT:  Head: Normocephalic and atraumatic.  Right Ear: External ear normal.  Left Ear: External ear normal.  Nose: Nose normal.  Mouth/Throat: Oropharynx is clear and moist. No oropharyngeal exudate.  TM pearly gray bilaterally with no perforation, fluid or erythema.  Eyes: Conjunctivae are normal. Pupils are equal, round, and reactive to light.  Neck: Normal range of motion. Neck supple.  Cardiovascular: Normal rate and regular rhythm.   Has a grade 2 systolic murmur; not new onset  Pulmonary/Chest: Effort normal and breath sounds normal. No respiratory distress.  Abdominal: Soft. Bowel sounds are normal. There is no tenderness.  Lymphadenopathy:    She has no cervical adenopathy.  Skin: Skin is warm and dry.  Nursing note and vitals reviewed.   Urgent Care Course     Procedures (including critical care time)  Labs  Review Labs Reviewed - No data to display  Imaging Review No results found. MDM   1. Left ear pain    No clear etiology for her otalgia. Physical examination was normal with no finding of an ear infection. Patient informed to take ibuprofen for pain relief. Informed to follow with primary care doctor if her ear pain persists. Also informed to follow up with her PCP for hypertension. Discharge paperwork given and all questions were answered.   Barry Dienes, NP 07/19/16 1241

## 2016-07-25 ENCOUNTER — Ambulatory Visit (INDEPENDENT_AMBULATORY_CARE_PROVIDER_SITE_OTHER): Payer: Managed Care, Other (non HMO) | Admitting: Internal Medicine

## 2016-07-25 ENCOUNTER — Other Ambulatory Visit: Payer: Self-pay

## 2016-07-25 ENCOUNTER — Encounter: Payer: Self-pay | Admitting: Internal Medicine

## 2016-07-25 VITALS — BP 122/84 | HR 80 | Ht 66.0 in | Wt 242.0 lb

## 2016-07-25 DIAGNOSIS — E042 Nontoxic multinodular goiter: Secondary | ICD-10-CM

## 2016-07-25 DIAGNOSIS — E049 Nontoxic goiter, unspecified: Secondary | ICD-10-CM | POA: Diagnosis not present

## 2016-07-25 LAB — TSH: TSH: 0.22 m[IU]/L — AB

## 2016-07-25 LAB — T3, FREE: T3, Free: 3 pg/mL (ref 2.3–4.2)

## 2016-07-25 LAB — T4, FREE: Free T4: 0.9 ng/dL (ref 0.8–1.8)

## 2016-07-25 NOTE — [facility] Notes (Addendum)
Patient ID: Cynthia Wilson, female   DOB: [birth year], 51 y.o.   MRN: [id number]   HPI  Cynthia Wilson is a 51 y.o.-year-old female, referred by her PCP, Dr. Gwendlyn Deutscher, in consultation for subclinical thyrotoxicosis and goiter.  She has MNG since 2004.  I reviewed pt's thyroid tests: Lab Results  Component Value Date   TSH 0.69 07/25/2014   TSH 0.268 (L) 07/07/2014   TSH 0.523 03/09/2012   FREET4 0.77 07/25/2014   FREET4 1.00 07/07/2014  TPO Abs were normal, at 1.  She had a Thyroid scan in 2004: 1. NORMAL RIGHT LOBE OF THE THYROID. 2. THREE COLD NODULES OF THE LEFT LOBE.   She had a thyroid U/S (04/08/2003): 1. DOMINANT 3.3 CM IN DIAMETER COMPLEX MASS CONTAINING CYSTIC AND SOLID COMPONENTS INVOLVING THE MAJORITY OF THE MID AND LOWER POLE OF THE LEFT LOBE OF THYROID CORRESPONDING TO THE COLD NODULE SEEN IN THIS REGION ON NUCLEAR MEDICINE STUDY.  2. THERE IS A SECOND 1.6 CM SIZE COMPLEX NODULE ASSOCIATED IN THE LOWER POLE OF THE LEFT LOBE OF THYROID.  3.SMALLER (6 AND 8 MM IN SIZE) NODULES WITHIN THE MID RIGHT LOBE OF THE THYROID.  L Thyroid nodule FNA (05/10/2003):The morphologic features are most consistent with a colloid nodule.   Thyroid U/S at [facility] (05/29/2014):   Several R nodules:1.4 cm (heterogeneous) and 1.1 cm (hypoechoic), vascular, nodules and 3 subcm nodules  3 L nodules: 2.1 cm (hypervascular), the rest smaller: 1.5 and 0.7 cm hypoechoic nodules.  Pt denies feeling nodules in neck, hoarseness, dysphagia/odynophagia, SOB with lying down; she denies: - fatigue - but has hot flushes - tremors - anxiety - palpitations - hyperdefecation - weight loss  Pt does not have a FH of thyroid ds. No FH of thyroid cancer. No h/o radiation tx to head or neck.  No seaweed or kelp, no recent contrast studies. No steroid use. No herbal supplements. On Biotin  - took it this am, started to take this last week.  She also has a history of HTN,  DM2,  GERD.  Since last visit, she started lisinopril.  ROS: Constitutional:+ see HPI Eyes: no blurry vision, no xerophthalmia ENT: + sore throat, no nodules palpated in throat, no dysphagia/odynophagia, no hoarseness Cardiovascular: no CP/SOB/palpitations/leg swelling Respiratory: no cough/SOB Gastrointestinal: no N/V/D/C/heartburn Musculoskeletal: no muscle/joint aches Skin: no rashes Neurological: no tremors/numbness/tingling/dizziness  I reviewed pt's medications, allergies, PMH, social hx, family hx, and changes were documented in the history of present illness. Otherwise, unchanged from my initial visit note. She stopped Metformin as HbA1c has been at target.  Past Medical History:  Diagnosis Date  . GERD (gastroesophageal reflux disease)   . History of osteoarthritis    left knee  . Hypertension    under control with med., has been on med. x 5 yr.  . Immature cataract   . Medial meniscus tear 09/2013   right knee  . Non-insulin dependent type 2 diabetes mellitus ([place])   . Wears partial dentures    upper   Past Surgical History:  Procedure Laterality Date  . CYST EXCISION  03/25/2007   resection peritoneal inclusion cyst  . KNEE ARTHROSCOPY Left 2011  . KNEE ARTHROSCOPY Right 10/21/2013   Procedure: ARTHROSCOPY RIGHT KNEE;  Surgeon: Alta Corning, MD;  Location: [facility];  Service: Orthopedics;  Laterality: Right;  medial , lateral and patella femoral chondromalsia and medial plica  . LAPAROSCOPY N/A 04/21/2013   Procedure: LAPAROSCOPY DIAGNOSTIC;  Surgeon:  Lavonia Drafts, MD;  Location: [facility];  Service: Gynecology;  Laterality: N/A;  . LAPAROTOMY N/A 04/21/2013   Procedure: LAPAROTOMY;  Surgeon: Lavonia Drafts, MD;  Location: [facility];  Service: Gynecology;  Laterality: N/A;  . LYSIS OF ADHESION  08/28/2005; 03/25/2007; 04/21/2013  . SALPINGOOPHORECTOMY Left 04/21/2013   Procedure: SALPINGO OOPHORECTOMY;  Surgeon: Lavonia Drafts, MD;   Location: [facility];  Service: Gynecology;  Laterality: Left;  . TOTAL ABDOMINAL HYSTERECTOMY  08/28/2005  . TOTAL KNEE ARTHROPLASTY Left 02/11/2013   Procedure: LEFT TOTAL KNEE ARTHROPLASTY;  Surgeon: Alta Corning, MD;  Location: [facility];  Service: Orthopedics;  Laterality: Left;  NO COMPUTER  . UMBILICAL HERNIA REPAIR  1984  . UNILATERAL SALPINGECTOMY Right 08/28/2005   History   Social History  . Marital Status: Married    Spouse Name: N/A    Number of Children: 4   Occupational History  .  Museum/gallery conservator    Social History Main Topics  . Smoking status: Never Smoker   . Smokeless tobacco: Never Used  . Alcohol Use: No  . Drug Use: No  . Sexual Activity: Yes    Birth Control/ Protection: Surgical   Social History Narrative   Lives with husband and 7 year old son.     Current Outpatient Prescriptions on File Prior to Visit  Medication Sig Dispense Refill  . guaiFENesin-codeine (CHERATUSSIN AC) 100-10 MG/5ML syrup Take 5 mLs by mouth 3 (three) times daily as needed for cough. 120 mL 0  . hydrochlorothiazide (HYDRODIURIL) 25 MG tablet TAKE 1 TABLET BY MOUTH EVERY DAY 90 tablet 2  . lisinopril (PRINIVIL,ZESTRIL) 2.5 MG tablet TAKE 1 TABLET EVERY DAY 90 tablet 1  . loratadine (CLARITIN) 10 MG tablet Take 10 mg by mouth daily.    . metFORMIN (GLUCOPHAGE-XR) 500 MG 24 hr tablet TAKE 1 TABLET BY MOUTH DAILY WITH BREAKFAST 90 tablet 2  . phenazopyridine (PYRIDIUM) 200 MG tablet Take 1 tablet (200 mg total) by mouth 3 (three) times daily as needed for pain. 10 tablet 0  . predniSONE (DELTASONE) 50 MG tablet Take 1 tablet (50 mg total) by mouth daily. 3 tablet 0  . triamcinolone (NASACORT AQ) 55 MCG/ACT AERO nasal inhaler Place 2 sprays into the nose daily. 1 Inhaler 0   No current facility-administered medications on file prior to visit.    Allergies  Allergen Reactions  . Iohexol Hives        . Ivp Dye [Iodinated Diagnostic Agents]   . Other Rash    COBAN   Family History   Problem Relation Age of Onset  . Diabetes Mother   . Hypertension Mother   . Hyperlipidemia Mother   . Emphysema Father    PE: BP 122/84   Pulse 80   Ht 5\' 6"  (1.676 m)   Wt 242 lb (109.8 kg)   SpO2 99%   BMI 39.06 kg/m  Body mass index is 39.06 kg/m.  Wt Readings from Last 3 Encounters:  07/25/16 242 lb (109.8 kg)  06/10/16 240 lb (108.9 kg)  06/09/16 237 lb (107.5 kg)   Constitutional: overweight, in NAD Eyes: PERRLA, EOMI ENT: moist mucous membranes, left thyroid fullness, no cervical lymphadenopathy Cardiovascular: RRR, No MRG Respiratory: CTA B Gastrointestinal: abdomen soft, NT, ND, BS+ Musculoskeletal: no deformities, strength intact in all 4 Skin: moist, warm, no rashes Neurological: no tremor with outstretched hands, DTR normal in all 4  ASSESSMENT: 1. Subclinical Thyrotoxicosis  2. Multinodular goiter - 2004 FNA of the left dominant  nodule benign  PLAN:  1. Patient with mild subclinical thyrotoxicosis, with TFTs that have normalized at last visit. She appears euthyroid  but complains of hot flashes, which can be due to menopause. - I suggested that we check the TSH, fT3 and fT4 today - If the tests remain abnormal, we may need a repeat uptake and scan to differentiate between possible etiologies: Graves ds., toxic MNG, Thyroiditis  2. Multinodular goiter - Patient with a long history of thyroid nodules, s/p a thyroid scan in 2004 that showed 3 left thyroid nodules that appeared cold on the scan. She had an FNA of the dominant nodule in the left lobe, which at that time measures 3.3 cm and appeared complex >> benign. She had a thyroid ultrasound (05/29/2014 at [facility]: thyroid was actually smaller compared to the size obtained in 2004. She had several smaller thyroid nodules bilaterally, with the largest one being 2.1 cm, hypervascular, in the left lobe. This appears to correspond to the 3.3 cm nodule seen in the left lobe in 2004. The fact that it  was benign at that time and that it decreased in size are indicative of a benign nodule and we discussed at last visit about following it clinically and with another ultrasound in 2 years if there are no compression signs.  - She presents today for follow-up. She does not have any new neck compression symptoms. - We discussed about further follow-up. We'll check another ultrasound now and if this is unchanged, I will continue to follow her clinically. - RTC in 2 years  Office Visit on 07/25/2016  Component Date Value Ref Range Status  . TSH 07/25/2016 0.22* mIU/L Final   Comment:   Reference Range   > or = 20 Years  0.40-4.50   Pregnancy Range First trimester  0.26-2.66 Second trimester 0.55-2.73 Third trimester  0.43-2.91     . Free T4 07/25/2016 0.9  0.8 - 1.8 ng/dL Final  . T3, Free 07/25/2016 3.0  2.3 - 4.2 pg/mL Final   TSH is slightly low. Free T4 and free T3 are normal. No intervention is needed for now, but I would like to repeat the tests, off biotin, in 2-3 months.  08/07/2016: Thyroid U/S: Study Result   CLINICAL DATA:  Goiter. Dominant left thyroid nodule was biopsied on 05/10/2003.  EXAM: THYROID ULTRASOUND  TECHNIQUE: Ultrasound examination of the thyroid gland and adjacent soft tissues was performed.  COMPARISON:  Ultrasound report from 04/07/2003. Images not available.  Biopsy images from 05/10/2003  FINDINGS: Parenchymal Echotexture: Mildly heterogenous  Isthmus: Measures 0.3 cm in the AP dimension.  Right lobe: 5.3 x 1.6 x 1.7 cm  Left lobe: 5.5 x 1.6 x 2.1 cm  _________________________________________________________  Estimated total number of nodules >/= 1 cm: 4  Number of spongiform nodules >/=  2 cm not described below (TR1): 0  Number of mixed cystic and solid nodules >/= 1.5 cm not described below (TR2): 0  Nodule # 1:  Location: Right; Mid  Maximum size: 1.3 cm; Other 2 dimensions: 0.8 x 0.9 cm  Composition:  solid/almost completely solid (2)  Echogenicity: hypoechoic (2)  ACR TI-RADS total points: 4.  ACR TI-RADS risk category: TR4 (4-6 points).  ACR TI-RADS recommendations:  *Given size (>/= 1 - 1.4 cm) and appearance, a follow-up ultrasound in 1 year should be considered based on TI-RADS criteria.  Nodule # 2:  Location: Right; Mid  Maximum size: 1.5 cm; Other 2 dimensions: 0.8 x 1.0 cm  Composition: solid/almost completely solid (2)  Echogenicity: hypoechoic (2)  Echogenic foci: punctate echogenic foci (3)  ACR TI-RADS total points: 7.  ACR TI-RADS risk category: TR5 (>/= 7 points).  ACR TI-RADS recommendations:  **Given size (>/= 1.0 cm) and appearance, fine needle aspiration of this highly suspicious nodule should be considered based on TI-RADS criteria.  Multiple additional small nodules throughout the right thyroid lobe. There are 2 cysts in the right inferior thyroid, largest measuring up to 0.7 cm.  ______________________________________________________________  Dominant mildly hypoechoic solid nodule in the mid left thyroid lobe that measures 2.3 x 1.3 x 1.6 cm. By report, this previously measured 3.3 x 1.5 x 2.1 cm. This appears to represent the previously biopsied left thyroid nodule. Few small areas of cystic degeneration within this nodule. There are questionable echogenic foci throughout this nodule.  Nodule # 3:  Location: Left; Inferior  Maximum size: 1.5 cm; Other 2 dimensions: 0.7 x 1.1 cm. By report, there was a nodule in the inferior left thyroid lobe that measured 1.6 x 0.9 x 1.0 cm.  Composition: solid/almost completely solid (2)  Echogenicity: hypoechoic (2)  Shape: not taller-than-wide (0)  Margins: lobulated/irregular (2)  Echogenic foci: macrocalcifications (1)  ACR TI-RADS total points: 7.  ACR TI-RADS risk category: TR5 (>/= 7 points).  ACR TI-RADS recommendations:  **Given size (>/= 1.0 cm)  and appearance, fine needle aspiration of this highly suspicious nodule should be considered based on TI-RADS criteria.  IMPRESSION: Multiple thyroid nodules.  The dominant nodule in left thyroid lobe measures up to 2.3 cm and appears to represent the previously biopsied nodule.  Nodule in the inferior left thyroid lobe (Nodule #3) and a nodule in the right mid thyroid lobe (Nodule #2) both meet criteria for biopsy.  Nodule #1 in the mid right thyroid lobe meets criteria for 1 year follow-up.  The above is in keeping with the ACR TI-RADS recommendations - J Am Coll Radiol 2017;14:587-595.   Electronically Signed   By: Markus Daft M.D.   On: 08/07/2016 10:55     Adequacy Reason UNSATISFACTORY for evaluation due to extremely scant cellularity. The specimen is processed and examined microscopically, but is found to be unsatisfactory for evaluation of an epithelial abnormality. Repeat study recommended. Diagnosis THYROID, FINE NEEDLE ASPIRATION LEFT LOWER POLE (SPECIMEN 1 OF 2 COLLECTED 09/10/2016) BLOOD. NO FOLLICULAR CELLULARITY PRESENT. Claudette Laws MD Pathologist, Electronic Signature (Case signed 09/11/2016) Specimen Clinical Information Previous Biopsy today and 05/11/2003), Nodule 3: Left Inferior 1.5 cm; Other 2 dimensions: 0.7 x 1.1 cm., By report there was nodule in the inferior left thyroid lobe that measured 1.6 x 0.9 x 1.0 cm., Hypoechoic, ACR TI- RADS total points:7, Highly suspicious nodule  Adequacy Reason Satisfactory For Evaluation. Diagnosis THYROID, FINE NEEDLE ASPIRATION, RMP (SPECIMEN 2 OF 2, COLLECTED 09/10/16): CONSISTENT WITH BENIGN FOLLICULAR NODULE (BETHESDA CATEGORY II). Claudette Laws MD Pathologist, Electronic Signature (Case signed 09/11/2016) Specimen Clinical Information Today and11/05/2003, Nodule 2: Right Mid 1.5 cm; Other 2 dimensions: 0.8 x 1.0cm solid /almost completely solid , Hypoechoic, ACR TI - RADS total points: 7, Highly  suspicious nodule Source Thyroid, Fine Needle Aspiration, Rt Lobe RMP, (Specimen 2of 2, collected on 09/10/16 )  The left inferior nodule was not biopsied satisfactorily. I will discuss with patient to repeat the biopsy in 6 months.  Philemon Kingdom, MD PhD [facility] Endocrinology

## 2016-07-25 NOTE — Patient Instructions (Signed)
Please stop at the lab.  You will be called with the U/S schedule.  Come back in 2 years.

## 2016-08-06 ENCOUNTER — Ambulatory Visit
Admission: RE | Admit: 2016-08-06 | Discharge: 2016-08-06 | Disposition: A | Discharge status: Home / Self Care | Payer: Managed Care, Other (non HMO) | Source: Ambulatory Visit | Attending: Internal Medicine | Admitting: Internal Medicine

## 2016-08-06 DIAGNOSIS — E042 Nontoxic multinodular goiter: Secondary | ICD-10-CM

## 2016-08-13 ENCOUNTER — Telehealth: Payer: Self-pay | Admitting: Internal Medicine

## 2016-08-13 ENCOUNTER — Other Ambulatory Visit: Payer: Self-pay | Admitting: Internal Medicine

## 2016-08-13 DIAGNOSIS — E042 Nontoxic multinodular goiter: Secondary | ICD-10-CM

## 2016-08-13 NOTE — Telephone Encounter (Signed)
Pt called in and wanted to let us know that she does want to go forward with the Biopsy.

## 2016-08-13 NOTE — Telephone Encounter (Signed)
Done

## 2016-09-10 ENCOUNTER — Ambulatory Visit
Admission: RE | Admit: 2016-09-10 | Discharge: 2016-09-10 | Disposition: A | Discharge status: Home / Self Care | Payer: Managed Care, Other (non HMO) | Source: Ambulatory Visit | Attending: Internal Medicine | Admitting: Internal Medicine

## 2016-09-10 ENCOUNTER — Other Ambulatory Visit (HOSPITAL_COMMUNITY)
Admission: RE | Admit: 2016-09-10 | Discharge: 2016-09-10 | Disposition: A | Discharge status: Home / Self Care | Payer: Managed Care, Other (non HMO) | Source: Ambulatory Visit | Attending: Radiology | Admitting: Radiology

## 2016-09-10 DIAGNOSIS — D34 Benign neoplasm of thyroid gland: Secondary | ICD-10-CM | POA: Insufficient documentation

## 2016-09-10 DIAGNOSIS — E041 Nontoxic single thyroid nodule: Secondary | ICD-10-CM | POA: Diagnosis present

## 2016-10-12 ENCOUNTER — Other Ambulatory Visit: Payer: Self-pay | Admitting: Family Medicine

## 2016-10-24 ENCOUNTER — Encounter: Payer: Self-pay | Admitting: Family Medicine

## 2016-10-24 ENCOUNTER — Ambulatory Visit (INDEPENDENT_AMBULATORY_CARE_PROVIDER_SITE_OTHER): Payer: Managed Care, Other (non HMO) | Admitting: Family Medicine

## 2016-10-24 VITALS — BP 128/80 | HR 76 | Temp 98.2°F | Ht 66.0 in | Wt 241.0 lb

## 2016-10-24 DIAGNOSIS — R42 Dizziness and giddiness: Secondary | ICD-10-CM | POA: Diagnosis not present

## 2016-10-24 DIAGNOSIS — I10 Essential (primary) hypertension: Secondary | ICD-10-CM

## 2016-10-24 DIAGNOSIS — R232 Flushing: Secondary | ICD-10-CM | POA: Insufficient documentation

## 2016-10-24 DIAGNOSIS — E119 Type 2 diabetes mellitus without complications: Secondary | ICD-10-CM

## 2016-10-24 LAB — POCT GLYCOSYLATED HEMOGLOBIN (HGB A1C): Hemoglobin A1C: 6.4

## 2016-10-24 MED ORDER — LISINOPRIL 20 MG PO TABS: 20.0000 mg | ORAL_TABLET | Freq: Every day | ORAL | 1 refills | Status: DC

## 2016-10-24 MED ORDER — MECLIZINE HCL 25 MG PO TABS: ORAL_TABLET | ORAL | 0 refills | Status: DC

## 2016-10-24 NOTE — Assessment & Plan Note (Signed)
A1C of 6.4 today. She asked if she can come off Metformin. I advised that she should continue medication for now till I see some weight loss and diet change. She will work on this over the next 3 months. F/U as needed.

## 2016-10-24 NOTE — Assessment & Plan Note (Signed)
Likely due to estrogen deficiency in postmenopausal woman. Different treatment option discussed. She prefers conservative measures at this time. We will monitor for now.

## 2016-10-24 NOTE — Assessment & Plan Note (Signed)
BP looks good. She will like to get of her meds. I agree with discontinuing HCTZ for now while going up on her Lisinopril which she needs for renal protection. I e-scribed Lisinopril 20 mg qd. Continue home BP monitoring. F/U soon if BP is running too high or low. She agreed with plan.

## 2016-10-24 NOTE — [facility] Notes (Signed)
Subjective:     Patient ID: Cynthia Wilson, female   DOB: [birth year], 51 y.o.   MRN: [id number]  Diabetes  She presents for her follow-up diabetic visit. She has type 2 diabetes mellitus. Her disease course has been stable. There are no diabetic associated symptoms. Risk factors for coronary artery disease include diabetes mellitus, hypertension and obesity. Current diabetic treatment includes oral agent (monotherapy) (Metformin ER 500 mg qd). She is compliant with treatment all of the time. Her weight is stable. She is following a generally healthy diet. She participates in exercise daily. An ACE inhibitor/angiotensin II receptor blocker is being taken.  Hypertension  This is a chronic problem. The current episode started more than 1 year ago. The problem is controlled. Past treatments include ACE inhibitors and diuretics (She is currently on Lisinopril 2.5 mg qd and HCTZ 25 mg qd. She will like to cut back on her BP meds).  Hot flashes: This started 1 yr ago intermittently but worsening. She had Total abdominal hysterectomy in 2007 without salpingectomy. She had ovarian surgery few years later. Denies any GU concern at this time. Vertigo:Patient stated she is starting to have vertigo flare up. This occurs at least 2-3 times per month. She is currently asymptomatic. She requested to be started on Meclizine. HM: Need vaccination update.  Current Outpatient Prescriptions on File Prior to Visit  Medication Sig Dispense Refill  . hydrochlorothiazide (HYDRODIURIL) 25 MG tablet TAKE 1 TABLET BY MOUTH EVERY DAY 90 tablet 2  . lisinopril (PRINIVIL,ZESTRIL) 2.5 MG tablet TAKE 1 TABLET EVERY DAY 90 tablet 1  . loratadine (CLARITIN) 10 MG tablet Take 10 mg by mouth daily.    . metFORMIN (GLUCOPHAGE-XR) 500 MG 24 hr tablet TAKE 1 TABLET BY MOUTH DAILY WITH BREAKFAST 90 tablet 2  . phenazopyridine (PYRIDIUM) 200 MG tablet Take 1 tablet (200 mg total) by mouth 3 (three) times daily as needed for pain. 10 tablet  0  . triamcinolone (NASACORT AQ) 55 MCG/ACT AERO nasal inhaler Place 2 sprays into the nose daily. 1 Inhaler 0   No current facility-administered medications on file prior to visit.    Past Medical History:  Diagnosis Date  . GERD (gastroesophageal reflux disease)   . History of osteoarthritis    left knee  . Hypertension    under control with med., has been on med. x 5 yr.  . Immature cataract   . KNEE PAIN, LEFT, CHRONIC 07/30/2009   Seen by [facility] ortho 03/31/12,[facility],  steroid injection   . Medial meniscus tear 09/2013   right knee  . Non-insulin dependent type 2 diabetes mellitus ([place])   . 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 Sinus headache 10/30/2015  . Wears partial dentures    upper     Review of Systems  Respiratory: Negative.   Cardiovascular: Negative.   Gastrointestinal: Negative.   Neurological:       Hot flashes  All other systems reviewed and are negative.      Objective:   Physical Exam  Constitutional: She is oriented to person, place, and time. She appears well-developed. No distress.  Cardiovascular: Normal rate, regular rhythm and normal heart sounds.   No murmur heard. Pulmonary/Chest: Effort normal and breath sounds normal. No respiratory distress. She has no wheezes.  Abdominal: Soft. Bowel sounds are normal. She exhibits no distension. There is no tenderness. There is no rebound.  Musculoskeletal: Normal range of motion.  Neurological: She is alert and oriented to person, place, and time.  Nursing note and vitals reviewed.      Assessment:     DM2 HTN Hot flashes Vertigo Health maintenance    Plan:     Check problem list.  Note Pneumonia vaccination was recommended but she declined today.

## 2016-10-24 NOTE — Patient Instructions (Signed)
It was nice seeing you today. Your A1C looks good. Let us continue Metformin 500 mg qd. BP also looks good. We will discontinue HCTZ. Your Lisinopril has been increased to 20 mg qd. Continue BP monitoring at home. Call if you have any question.   Hot Flashes Menopause is the normal time of life when menstrual periods stop completely. Menopause is complete when you have missed 12 consecutive menstrual periods. It usually occurs between the ages of 71 years and 75 years. Very rarely does a woman develop menopause before the age of 58 years. At menopause, your ovaries stop producing the female hormones estrogen and progesterone. This can cause undesirable symptoms and also affect your health. Sometimes the symptoms may occur 4-5 years before the menopause begins. There is no relationship between menopause and:  Oral contraceptives.  Number of children you had.  Race.  The age your menstrual periods started (menarche). Heavy smokers and very thin women may develop menopause earlier in life. What are the causes?  The ovaries stop producing the female hormones estrogen and progesterone. Other causes include:  Surgery to remove both ovaries.  The ovaries stop functioning for no known reason.  Tumors of the pituitary gland in the brain.  Medical disease that affects the ovaries and hormone production.  Radiation treatment to the abdomen or pelvis.  Chemotherapy that affects the ovaries. What are the signs or symptoms?  Hot flashes.  Night sweats.  Decrease in sex drive.  Vaginal dryness and thinning of the vagina causing painful intercourse.  Dryness of the skin and developing wrinkles.  Headaches.  Tiredness.  Irritability.  Memory problems.  Weight gain.  Bladder infections.  Hair growth of the face and chest.  Infertility. More serious symptoms include:  Loss of bone (osteoporosis) causing breaks (fractures).  Depression.  Hardening and narrowing of the  arteries (atherosclerosis) causing heart attacks and strokes. How is this diagnosed?  When the menstrual periods have stopped for 12 straight months.  Physical exam.  Hormone studies of the blood. How is this treated? There are many treatment choices and nearly as many questions about them. The decisions to treat or not to treat menopausal changes is an individual choice made with your health care provider. Your health care provider can discuss the treatments with you. Together, you can decide which treatment will work best for you. Your treatment choices may include:  Hormone therapy (estrogen and progesterone).  Non-hormonal medicines.  Treating the individual symptoms with medicine (for example antidepressants for depression).  Herbal medicines that may help specific symptoms.  Counseling by a psychiatrist or psychologist.  Group therapy.  Lifestyle changes including:  Eating healthy.  Regular exercise.  Limiting caffeine and alcohol.  Stress management and meditation.  No treatment. Follow these instructions at home:  Take the medicine your health care provider gives you as directed.  Get plenty of sleep and rest.  Exercise regularly.  Eat a diet that contains calcium (good for the bones) and soy products (acts like estrogen hormone).  Avoid alcoholic beverages.  Do not smoke.  If you have hot flashes, dress in layers.  Take supplements, calcium, and vitamin D to strengthen bones.  You can use over-the-counter lubricants or moisturizers for vaginal dryness.  Group therapy is sometimes very helpful.  Acupuncture may be helpful in some cases. Contact a health care provider if:  You are not sure you are in menopause.  You are having menopausal symptoms and need advice and treatment.  You are still having  menstrual periods after age 87 years.  You have pain with intercourse.  Menopause is complete (no menstrual period for 12 months) and you develop  vaginal bleeding.  You need a referral to a specialist (gynecologist, psychiatrist, or psychologist) for treatment. Get help right away if:  You have severe depression.  You have excessive vaginal bleeding.  You fell and think you have a broken bone.  You have pain when you urinate.  You develop leg or chest pain.  You have a fast pounding heart beat (palpitations).  You have severe headaches.  You develop vision problems.  You feel a lump in your breast.  You have abdominal pain or severe indigestion. This information is not intended to replace advice given to you by your health care provider. Make sure you discuss any questions you have with your health care provider. Document Released: 09/06/2003 Document Revised: 11/22/2015 Document Reviewed: 01/13/2013 Elsevier Interactive Patient Education  2017 [employer].

## 2016-10-24 NOTE — Assessment & Plan Note (Signed)
Currently asymptomatic. Meclizine prescribed. Side effect of meds discussed. I advised her to avoid use when driving or operating any machinery since it is sedative. She verbalized understanding.

## 2016-11-15 LAB — HM DIABETES EYE EXAM

## 2016-12-01 ENCOUNTER — Encounter: Payer: Self-pay | Admitting: Student

## 2016-12-01 ENCOUNTER — Ambulatory Visit (INDEPENDENT_AMBULATORY_CARE_PROVIDER_SITE_OTHER): Payer: Managed Care, Other (non HMO) | Admitting: Student

## 2016-12-01 VITALS — BP 120/80 | HR 100 | Temp 99.1°F | Wt 240.0 lb

## 2016-12-01 DIAGNOSIS — J039 Acute tonsillitis, unspecified: Secondary | ICD-10-CM | POA: Diagnosis not present

## 2016-12-01 DIAGNOSIS — J0391 Acute recurrent tonsillitis, unspecified: Secondary | ICD-10-CM | POA: Insufficient documentation

## 2016-12-01 DIAGNOSIS — J029 Acute pharyngitis, unspecified: Secondary | ICD-10-CM

## 2016-12-01 HISTORY — DX: Acute recurrent tonsillitis, unspecified: J03.91

## 2016-12-01 LAB — POCT RAPID STREP A (OFFICE): Rapid Strep A Screen: POSITIVE — AB

## 2016-12-01 MED ORDER — DEXAMETHASONE SODIUM PHOSPHATE 10 MG/ML IJ SOLN
10.0000 mg | Freq: Once | INTRAMUSCULAR | Status: AC
  Administered 2016-12-01: 10 mg via INTRAMUSCULAR

## 2016-12-01 MED ORDER — AMOXICILLIN-POT CLAVULANATE 875-125 MG PO TABS: 1.0000 | ORAL_TABLET | Freq: Two times a day (BID) | ORAL | 0 refills | Status: DC

## 2016-12-01 NOTE — Patient Instructions (Addendum)
Follow up tomorrow with Dr Darius Bump HYDRATED with water, gatorade, popcicles You can try honey to help soothe your throat Take antibiotics as prescribed Call the office at [phone] with questions or concerns

## 2016-12-01 NOTE — Assessment & Plan Note (Signed)
Unilateral tonsillar infection, no trismus or voice changes making peritonsillar abscess less likley - will start augmentin - follow in 24 hours to assess infection - return and ED precautions discussed

## 2016-12-01 NOTE — [facility] Notes (Signed)
Sore  

## 2016-12-01 NOTE — Progress Notes (Signed)
   Subjective:    Patient ID: Cynthia Wilson, female    DOB: [birth year], 51 y.o.   MRN: [id number]   CC: Sore throat  HPI: 51 y/o F presents for sore throat  Sore throat - started 2 days ago - awoke drenched in sweat last night, else she did not check her temperature - she is able to open her mouth with minimal pain, some pain with chewing, no muffled voice - no known sick contacts - some pain with swallowing  Smoking status reviewed  Review of Systems  Per HPI, else denies recent illness, fever, chest pain, shortness of breath,     Objective:  BP 120/80   Pulse 100   Temp 99.1 F (37.3 C) (Oral)   Wt 240 lb (108.9 kg)   SpO2 98%   BMI 38.74 kg/m  Vitals and nursing note reviewed  General: NAD HEENT: right sided tonsillary swelling with erythema, some uvular deviation, right sided neck tenderness to palpation. No muffle voice Cardiac: RRR,  Respiratory: CTAB, normal effort Skin: warm and dry, no rashes noted Neuro: alert and oriented, no focal deficits   Assessment & Plan:    Tonsillitis Unilateral tonsillar infection, no trismus or voice changes making peritonsillar abscess less likley - will start augmentin - follow in 24 hours to assess infection - return and ED precautions discussed    Cordell Coke A. Lincoln Brigham MD, [facility] Resident PGY-3 Pager [phone]

## 2016-12-02 ENCOUNTER — Encounter: Payer: Self-pay | Admitting: Student

## 2016-12-02 ENCOUNTER — Ambulatory Visit (INDEPENDENT_AMBULATORY_CARE_PROVIDER_SITE_OTHER): Payer: Managed Care, Other (non HMO) | Admitting: Student

## 2016-12-02 DIAGNOSIS — J039 Acute tonsillitis, unspecified: Secondary | ICD-10-CM

## 2016-12-02 NOTE — Patient Instructions (Signed)
Follow up on 6/7 with Dr Lincoln Brigham If you feel your throat is worsening or you have worsening fevers, go to the ED  Try tea with honey or cough drops for sore throat Call the office with questions or concerns

## 2016-12-02 NOTE — Assessment & Plan Note (Signed)
Evolving tonsillitis to bilateral inflammation with exudates. But overall improved swelling with no red flags for tonsillar abscess. Will continue augmetin. Follow closely as needed

## 2016-12-02 NOTE — Progress Notes (Signed)
   Subjective:    Patient ID: Cynthia Wilson, female    DOB: [birth year], 51 y.o.   MRN: [id number]   CC: sore throat follow up  HPI: 51 y/o F presents for sore throat follow up  Sore throat - seen yesterday and started in augmentin - told to follow up to rule out worsening tonsillar abscess - since yesterday her throat pain has started to improve and is now located over both tonsils, no trismus - no drooling - voice dose sound hoarse - no fevers - improved swallowing  Smoking status reviewed  Review of Systems  Per HPI, else denies chest pain, shortness of breath,     Objective:  BP 124/68   Pulse 68   Temp 98.8 F (37.1 C) (Oral)   Ht 5\' 6"  (1.676 m)   Wt 236 lb (107 kg)   BMI 38.09 kg/m  Vitals and nursing note reviewed  General: NAD HEENT: improved tonsillar swelling, midline uvula, bilateral exudates on tonsils Cardiac: RRR,  Respiratory: CTAB, normal effort Extremities: no edema or cyanosis. WWP. Skin: warm and dry, no rashes noted Neuro: alert and oriented, no focal deficits   Assessment & Plan:    Tonsillitis Evolving tonsillitis to bilateral inflammation with exudates. But overall improved swelling with no red flags for tonsillar abscess. Will continue augmetin. Follow closely as needed    Hristopher Missildine A. Lincoln Brigham MD, [facility] Resident PGY-3 Pager [phone]

## 2016-12-04 ENCOUNTER — Encounter: Payer: Self-pay | Admitting: Student

## 2016-12-04 ENCOUNTER — Ambulatory Visit (INDEPENDENT_AMBULATORY_CARE_PROVIDER_SITE_OTHER): Payer: Managed Care, Other (non HMO) | Admitting: Student

## 2016-12-04 ENCOUNTER — Ambulatory Visit: Payer: Managed Care, Other (non HMO) | Admitting: Obstetrics and Gynecology

## 2016-12-04 DIAGNOSIS — J039 Acute tonsillitis, unspecified: Secondary | ICD-10-CM | POA: Diagnosis not present

## 2016-12-04 NOTE — Patient Instructions (Signed)
Follow up as needed If you feel your symptoms are worsening return or call the office  Call the office with questions or concerns

## 2016-12-04 NOTE — Assessment & Plan Note (Signed)
Markedly improved throat exam and subjective symptoms - will continue aumentin to complete the course - follow as needed

## 2016-12-04 NOTE — Progress Notes (Signed)
   Subjective:    Patient ID: Cynthia Wilson, female    DOB: 19-Sep-1965, 51 y.o.   MRN: [id number]   CC: follow up sore throat  HPI: 51 y/o F presents for follow up soret hroat  Sore throat - sore throat much improved - no fevers - some pain with swallowing a hamburger yesterday but other wise imroved - reports compliance with auigmentin  Smoking status reviewed  Review of Systems  Per HPI, else denies chest pain, shortness of breath,     Objective:  BP 124/82   Pulse 67   Temp 98.8 F (37.1 C) (Oral)   Wt 238 lb (108 kg)   SpO2 99%   BMI 38.41 kg/m  Vitals and nursing note reviewed  General: NAD HEENT: improved oropharyngeal eyrthema with improving exudates, no swelling, midline uvula Cardiac: RRR, Respiratory: CTAB, normal effort Skin: warm and dry, no rashes noted Neuro: alert and oriented, no focal deficits   Assessment & Plan:    Tonsillitis Markedly improved throat exam and subjective symptoms - will continue aumentin to complete the course - follow as needed    Kittie Krizan A. Lincoln Brigham MD, [facility] Resident PGY-3 Pager [phone]

## 2016-12-09 ENCOUNTER — Telehealth: Payer: Self-pay | Admitting: Family Medicine

## 2016-12-09 ENCOUNTER — Other Ambulatory Visit: Payer: Self-pay | Admitting: Family Medicine

## 2016-12-09 MED ORDER — FLUCONAZOLE 150 MG PO TABS: 150.0000 mg | ORAL_TABLET | Freq: Once | ORAL | 0 refills | Status: AC

## 2016-12-09 NOTE — Telephone Encounter (Signed)
Pt called because she was seen last week and given a antibiotic and now she has a yeast infection. Can we send in something for this. Please let patient know so that she can pick this up.jw

## 2016-12-09 NOTE — Telephone Encounter (Signed)
Pt contacted and informed of rx sent to her pharmacy.

## 2016-12-09 NOTE — Telephone Encounter (Signed)
Advise patient diflucan has been escribed.

## 2016-12-18 ENCOUNTER — Encounter: Payer: Self-pay | Admitting: Family Medicine

## 2017-01-06 ENCOUNTER — Ambulatory Visit (HOSPITAL_COMMUNITY)
Admission: EM | Admit: 2017-01-06 | Discharge: 2017-01-06 | Disposition: A | Discharge status: Home / Self Care | Payer: Managed Care, Other (non HMO) | Attending: Family Medicine | Admitting: Family Medicine

## 2017-01-06 ENCOUNTER — Encounter (HOSPITAL_COMMUNITY): Payer: Self-pay | Admitting: *Deleted

## 2017-01-06 DIAGNOSIS — J029 Acute pharyngitis, unspecified: Secondary | ICD-10-CM

## 2017-01-06 MED ORDER — AMOXICILLIN-POT CLAVULANATE 875-125 MG PO TABS: 1.0000 | ORAL_TABLET | Freq: Two times a day (BID) | ORAL | 0 refills | Status: DC

## 2017-01-06 MED ORDER — LIDOCAINE VISCOUS 2 % MT SOLN: OROMUCOSAL | 0 refills | Status: DC

## 2017-01-06 NOTE — ED Triage Notes (Signed)
Pt  Has  A   sorethroat   Since  Yest  -  Pt    Says    She  was  Treated  For  Tonsillitis   About       1  Month   Ago         Pt  States   Hurts  To  Swallow   As  Well

## 2017-01-09 ENCOUNTER — Other Ambulatory Visit: Payer: Self-pay | Admitting: Family Medicine

## 2017-01-09 NOTE — Telephone Encounter (Signed)
Pt went to urgent care this and was given an antibiotic and now pt has a yeast infection. Pt would like something called into [facility], ep

## 2017-01-26 ENCOUNTER — Ambulatory Visit (INDEPENDENT_AMBULATORY_CARE_PROVIDER_SITE_OTHER): Payer: Managed Care, Other (non HMO) | Admitting: Internal Medicine

## 2017-01-26 ENCOUNTER — Encounter: Payer: Self-pay | Admitting: Internal Medicine

## 2017-01-26 VITALS — BP 110/80 | HR 76 | Temp 99.1°F | Wt 240.0 lb

## 2017-01-26 DIAGNOSIS — J039 Acute tonsillitis, unspecified: Secondary | ICD-10-CM

## 2017-01-26 LAB — POCT RAPID STREP A (OFFICE): Rapid Strep A Screen: POSITIVE — AB

## 2017-01-26 MED ORDER — AMOXICILLIN 500 MG PO TABS: 500.0000 mg | ORAL_TABLET | Freq: Two times a day (BID) | ORAL | 0 refills | Status: DC

## 2017-01-26 NOTE — Progress Notes (Signed)
   [facility] Phone: [phone]  Subjective:  Cynthia Wilson is a 51 year old female presenting to clinic with sore throat for the last 2 days. She also endorses hoarseness, headache, and left ear pain. She had a runny nose last week, but this has resolved. She denies any fevers, chills, nausea, or vomiting. She denies any difficulty swallowing. She was seen in clinic on 6/4 with tonsillitis and was treated with Augmentin. The sore throat resolved after this treatment, but then returned a couple of days ago. Per patient, she has been seen by ENT recently and was told that she may need to have her tonsils removed.  ROS: See HPI for pertinent positives and negatives  Past Medical History- HTN, GERD, T2DM, obesity  Family history reviewed for today's visit. No changes.  Social history- patient is a never smoker  Objective: BP 110/80   Pulse 76   Temp 99.1 F (37.3 C) (Oral)   Wt 240 lb (108.9 kg)   SpO2 99%   BMI 38.74 kg/m  Gen: NAD, alert, cooperative with exam HEENT: NCAT, EOMI, MMM, TMs normal, oropharynx erythematous without exudate. Neck: FROM, supple, no cervical lymphadenopathy  Assessment/Plan: Tonsillitis: Positive rapid strep test, but unsure if patient has a true acute strep infection or if she is colonized with this bacteria, as she has had multiple episodes of tonsillitis in the past. - Treat with Amoxicillin bid x 7 days - Return in 1-2 weeks for repeat rapid strep test to see if she is colonized with strep - Recommend that she follows up with ENT for consideration of surgical management - Follow-up with [place] if no improvement after course of antibiotics.   Hyman Bible, MD PGY-3

## 2017-01-26 NOTE — Patient Instructions (Signed)
It was so nice to meet you!  Your strep test was positive. I have prescribed Amoxicillin. Please take 1 tablet twice a day for 7 days.  -Dr. Brett Albino

## 2017-01-26 NOTE — Assessment & Plan Note (Signed)
Positive rapid strep test, but unsure if patient has a true acute strep infection or if she is colonized with this bacteria, as she has had multiple episodes of tonsillitis in the past. - Treat with Amoxicillin bid x 7 days - Return in 1-2 weeks for repeat rapid strep test to see if she is colonized with strep - Recommend that she follows up with ENT for consideration of surgical management - Follow-up with [place] if no improvement after course of antibiotics.

## 2017-02-02 ENCOUNTER — Encounter: Payer: Self-pay | Admitting: Internal Medicine

## 2017-02-02 ENCOUNTER — Ambulatory Visit (INDEPENDENT_AMBULATORY_CARE_PROVIDER_SITE_OTHER): Payer: Managed Care, Other (non HMO) | Admitting: Internal Medicine

## 2017-02-02 VITALS — BP 110/78 | HR 94 | Temp 98.4°F | Wt 243.0 lb

## 2017-02-02 DIAGNOSIS — J0391 Acute recurrent tonsillitis, unspecified: Secondary | ICD-10-CM

## 2017-02-02 DIAGNOSIS — J020 Streptococcal pharyngitis: Secondary | ICD-10-CM | POA: Diagnosis not present

## 2017-02-02 LAB — POCT RAPID STREP A (OFFICE): Rapid Strep A Screen: NEGATIVE

## 2017-02-02 MED ORDER — FLUTICASONE PROPIONATE 50 MCG/ACT NA SUSP: 2.0000 | Freq: Every day | NASAL | 6 refills | Status: DC

## 2017-02-02 NOTE — Patient Instructions (Signed)
It was so nice to see you!  I have prescribed Flonase to help with your post-nasal drip. Please use two sprays per nostril once a day.  Please make sure you follow-up with the ear, nose, and throat doctor.  -Dr. Brett Albino

## 2017-02-02 NOTE — Progress Notes (Signed)
   [facility] Phone: [phone]  Subjective:  Cynthia Wilson is a 51 year old female presenting to clinic for follow-up of her tonsillitis. She was seen in clinic on 01/26/17 with sore throat. Rapid strep test was positive. Given her symptoms, she was treated with a 7 day course of Amoxicillin. It was unclear at the time if she was just a carrier of strep or if she had a true acute strep infection. She returns today for repeat rapid strep test to determine if she is a carrier. She finished the course of antibiotics yesterday. She states that her symptoms have much improved. She is feeling much better. She is no longer having a sore throat. She has an appointment scheduled with ENT on 8/29 to discuss tonsillectomy due to her recurrent tonsillitis. She does endorse chronic sinus drainage and postnasal drip. She has been taking Claritin at home. She does not use a steroid nose spray. She denies any heartburn or hoarseness.   ROS: See HPI for pertinent positives and negatives  Past Medical History- HTN, recurrent tonsillitis, T2DM, goiter, osteoarthritis.  Family history reviewed for today's visit. No changes.  Social history- patient is a never smoker  Objective: BP 110/78   Pulse 94   Temp 98.4 F (36.9 C) (Oral)   Wt 243 lb (110.2 kg)   SpO2 99%   BMI 39.22 kg/m  Gen: NAD, alert, cooperative with exam HEENT: NCAT, EOMI, MMM, TMs normal in appearance, oropharynx normal without erythema or exudates.  Assessment/Plan: Recurrent Tonsillitis: Recently treated for strep infection with Amoxicillin and symptoms resolved. Rapid strep repeated today to determine if she is a carrier and it was negative. Not having any heartburn symptoms, so do not think GERD is contributing to her recurrent sore throat. She does have chronic nasal drainage and postnasal drip, so allergies may be contributing. - Follow-up with ENT on 8/29- patient would like to discuss tonsillectomy - Continue  Claritin. Will also add Flonase to see if this helps. - Follow-up with PCP as needed.   Hyman Bible, MD PGY-3

## 2017-02-03 NOTE — Assessment & Plan Note (Signed)
Recently treated for strep infection with Amoxicillin and symptoms resolved. Rapid strep repeated today to determine if she is a carrier and it was negative. Not having any heartburn symptoms, so do not think GERD is contributing to her recurrent sore throat. She does have chronic nasal drainage and postnasal drip, so allergies may be contributing. - Follow-up with ENT on 8/29- patient would like to discuss tonsillectomy - Continue Claritin. Will also add Flonase to see if this helps. - Follow-up with PCP as needed.

## 2017-03-24 ENCOUNTER — Telehealth: Payer: Self-pay | Admitting: *Deleted

## 2017-03-24 ENCOUNTER — Ambulatory Visit: Payer: Managed Care, Other (non HMO) | Admitting: Family Medicine

## 2017-03-24 ENCOUNTER — Other Ambulatory Visit (INDEPENDENT_AMBULATORY_CARE_PROVIDER_SITE_OTHER): Payer: Managed Care, Other (non HMO)

## 2017-03-24 DIAGNOSIS — E119 Type 2 diabetes mellitus without complications: Secondary | ICD-10-CM

## 2017-03-24 LAB — POCT GLYCOSYLATED HEMOGLOBIN (HGB A1C): Hemoglobin A1C: 6.1

## 2017-03-24 NOTE — Telephone Encounter (Signed)
Patient informed of A1C results and confirmed upcoming appointment on 03-27-17. Jazmin Hartsell,CMA

## 2017-03-24 NOTE — Telephone Encounter (Signed)
-----   Message from Kinnie Feil, MD sent at 03/24/2017 12:35 PM EDT ----- Please advise patient that her A1C is normal.

## 2017-03-27 ENCOUNTER — Ambulatory Visit (INDEPENDENT_AMBULATORY_CARE_PROVIDER_SITE_OTHER): Payer: Managed Care, Other (non HMO) | Admitting: Family Medicine

## 2017-03-27 ENCOUNTER — Ambulatory Visit: Payer: Managed Care, Other (non HMO) | Admitting: Family Medicine

## 2017-03-27 ENCOUNTER — Encounter: Payer: Self-pay | Admitting: Family Medicine

## 2017-03-27 DIAGNOSIS — E119 Type 2 diabetes mellitus without complications: Secondary | ICD-10-CM

## 2017-03-27 DIAGNOSIS — Z01419 Encounter for gynecological examination (general) (routine) without abnormal findings: Secondary | ICD-10-CM

## 2017-03-27 DIAGNOSIS — J0391 Acute recurrent tonsillitis, unspecified: Secondary | ICD-10-CM

## 2017-03-27 DIAGNOSIS — I10 Essential (primary) hypertension: Secondary | ICD-10-CM

## 2017-03-27 NOTE — Assessment & Plan Note (Signed)
A1C remains stable at 6.1 I recommended cutting back on metformin to 250 mg qd. She prefers to d/c it all together. She will work on exercise and weight loss. F/U in 3 months. May D/C Metformin for now.

## 2017-03-27 NOTE — Assessment & Plan Note (Signed)
BP stable on meds

## 2017-03-27 NOTE — Assessment & Plan Note (Signed)
Note for Gyn exam. She does not need PAP since she had her cervix removed. I confirmed by reviewing her hysterectomy report as well as her pathology report. She also confirmed her cervix was removed. I discussed routine yearly pelvic exam but she prefers to do this only when she has GU complaint.

## 2017-03-27 NOTE — Patient Instructions (Signed)
It was nice seeing you today. We can go ahead and discontinue your Metformin. Please continue to work on diet and exercise for weight loss. I will see you back in 3 months. In the mean time I will recommended home glucose monitoring at least once daily. If your numbers are running in the 180s to 200s, please call me.

## 2017-03-27 NOTE — Assessment & Plan Note (Signed)
Diet and exercise counseling discussed. She will work on this. Anticipate few pounds weight shed prior to her next visit in 3 months.

## 2017-03-27 NOTE — Assessment & Plan Note (Signed)
Scheduled tonsillectomy. I will complete medical clearance when requested.

## 2017-03-27 NOTE — Progress Notes (Signed)
Subjective:     Patient ID: Cynthia Wilson, female   DOB: 1965/12/12, 51 y.o.   MRN: [id number]  HPI DM2/Obesity:Here for follow-up. She is compliant with her meds and diet but not so much with exercise. She gained couple of pounds since last visit. She plan on returning to her exercise regimen. HTN: She is compliant with meds. Here for follow-up Recurrent Strep: Had recurrent strep infection in the past few months. She was seen by her ENT who recommended tonsil removal. She is scheduled for Oct 12th. Denies ENT concern today. Gyn exam: She mentioned she had hysterectomy in 2007 and does not need PAP.  Current Outpatient Prescriptions on File Prior to Visit  Medication Sig Dispense Refill  . lisinopril (PRINIVIL,ZESTRIL) 20 MG tablet Take 1 tablet (20 mg total) by mouth daily. 90 tablet 1  . loratadine (CLARITIN) 10 MG tablet Take 10 mg by mouth daily.    . metFORMIN (GLUCOPHAGE-XR) 500 MG 24 hr tablet TAKE 1 TABLET BY MOUTH DAILY WITH BREAKFAST 90 tablet 2  . fluticasone (FLONASE) 50 MCG/ACT nasal spray Place 2 sprays into both nostrils daily. (Patient not taking: Reported on 03/27/2017) 16 g 6  . 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 (Patient not taking: Reported on 03/27/2017) 30 tablet 0  . triamcinolone (NASACORT AQ) 55 MCG/ACT AERO nasal inhaler Place 2 sprays into the nose daily. (Patient not taking: Reported on 10/24/2016) 1 Inhaler 0   No current facility-administered medications on file prior to visit.    Past Medical History:  Diagnosis Date  . GERD (gastroesophageal reflux disease)   . History of osteoarthritis    left knee  . Hypertension    under control with med., has been on med. x 5 yr.  . Immature cataract   . KNEE PAIN, LEFT, CHRONIC 07/30/2009   Seen by [facility] orth[facility] 03/31/12,DJD,  steroid injection   . Medial meniscus tear 09/2013   right knee  . Non-insulin dependent type 2  diabetes mellitus ([place])   . 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 Sinus headache 10/30/2015  . Wears partial dentures    upper   Vitals:   03/27/17 0830  BP: 118/78  Pulse: 78  Temp: 98.5 F (36.9 C)  TempSrc: Oral  SpO2: 98%  Weight: 248 lb (112.5 kg)    Review of Systems  HENT:       Recurrent strep throat.  Respiratory: Negative.   Cardiovascular: Negative.   Gastrointestinal: Negative.   Neurological: Negative.   All other systems reviewed and are negative.      Objective:   Physical Exam  Constitutional: She is oriented to person, place, and time. She appears well-developed. No distress.  HENT:  Right Ear: External ear normal.  Left Ear: External ear normal.  Mouth/Throat: Oropharynx is clear and moist. No oropharyngeal exudate.  Eyes: Pupils are equal, round, and reactive to light. Right eye exhibits no discharge. Left eye exhibits no discharge.  Cardiovascular: Normal rate, regular rhythm and normal heart sounds.   No murmur heard. Pulmonary/Chest: Effort normal and breath sounds normal. No respiratory distress. She has no wheezes.  Abdominal: Soft. She exhibits no distension and no mass. There is no tenderness.  Musculoskeletal: Normal range of motion. She exhibits no edema.  Neurological: She is alert and oriented to person, place, and time.  Nursing note and vitals reviewed.      Assessment:  DM2 Morbid Obesity HTN Recurrent strep infection Gyn exam     Plan:     Check problem list.

## 2017-04-07 ENCOUNTER — Ambulatory Visit: Payer: Managed Care, Other (non HMO) | Admitting: Family Medicine

## 2017-04-08 ENCOUNTER — Other Ambulatory Visit: Payer: Self-pay | Admitting: Family Medicine

## 2017-04-10 ENCOUNTER — Other Ambulatory Visit (INDEPENDENT_AMBULATORY_CARE_PROVIDER_SITE_OTHER): Payer: Self-pay | Admitting: Otolaryngology

## 2017-04-16 ENCOUNTER — Other Ambulatory Visit: Payer: Self-pay | Admitting: Family Medicine

## 2017-05-25 ENCOUNTER — Ambulatory Visit (HOSPITAL_COMMUNITY)
Admission: EM | Admit: 2017-05-25 | Discharge: 2017-05-25 | Disposition: A | Discharge status: Home / Self Care | Payer: Managed Care, Other (non HMO) | Attending: Internal Medicine | Admitting: Internal Medicine

## 2017-05-25 ENCOUNTER — Encounter (HOSPITAL_COMMUNITY): Payer: Self-pay | Admitting: Emergency Medicine

## 2017-05-25 DIAGNOSIS — S39012A Strain of muscle, fascia and tendon of lower back, initial encounter: Secondary | ICD-10-CM

## 2017-05-25 DIAGNOSIS — M545 Low back pain, unspecified: Secondary | ICD-10-CM

## 2017-05-25 DIAGNOSIS — T148XXA Other injury of unspecified body region, initial encounter: Secondary | ICD-10-CM

## 2017-05-25 DIAGNOSIS — R351 Nocturia: Secondary | ICD-10-CM | POA: Diagnosis not present

## 2017-05-25 DIAGNOSIS — R350 Frequency of micturition: Secondary | ICD-10-CM | POA: Diagnosis not present

## 2017-05-25 DIAGNOSIS — Y92532 Urgent care center as the place of occurrence of the external cause: Secondary | ICD-10-CM | POA: Diagnosis not present

## 2017-05-25 LAB — POCT URINALYSIS DIP (DEVICE)
Bilirubin Urine: NEGATIVE
GLUCOSE, UA: NEGATIVE mg/dL
Ketones, ur: NEGATIVE mg/dL
LEUKOCYTES UA: NEGATIVE
NITRITE: NEGATIVE
Protein, ur: NEGATIVE mg/dL
Specific Gravity, Urine: 1.02 (ref 1.005–1.030)
UROBILINOGEN UA: 0.2 mg/dL (ref 0.0–1.0)
pH: 7 (ref 5.0–8.0)

## 2017-05-25 MED ORDER — CYCLOBENZAPRINE HCL 5 MG PO TABS: ORAL_TABLET | ORAL | 0 refills | Status: DC

## 2017-05-25 NOTE — Discharge Instructions (Signed)
Follow-up with your primary care provider for frequency of urination. For your back perform stretches as demonstrated several times a day. Make sure you use good chair with lumbar support. Apply heat.

## 2017-05-25 NOTE — ED Provider Notes (Addendum)
[facility]    CSN: [id number] Arrival date & time: 05/25/17  1735     History   Chief Complaint Chief Complaint  Patient presents with  . Flank Pain    HPI Cynthia Wilson is a 51 y.o. female.   51 year old female presents to the urgent care with left low back pain for one week. It is worse with certain positions such as lying down, bending forward, turning and twisting. She does note that she has a job which requires sitting for several hours during the day. Her second complaint is that of urinary frequency and urgency primarily at nighttime. Denies dysuria to me. No fever or chills or pelvic pain.      Past Medical History:  Diagnosis Date  . GERD (gastroesophageal reflux disease)   . History of osteoarthritis    left knee  . Hypertension    under control with med., has been on med. x 5 yr.  . Immature cataract   . KNEE PAIN, LEFT, CHRONIC 07/30/2009   Seen by [facility] ortho 03/31/12,DJD,  steroid injection   . Medial meniscus tear 09/2013   right knee  . Non-insulin dependent type 2 diabetes mellitus ([place])   . 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 Sinus headache 10/30/2015  . Wears partial dentures    upper    Patient Active Problem List   Diagnosis Date Noted  . Recurrent tonsillitis 12/01/2016  . Hot flashes 10/24/2016  . Multinodular goiter 07/25/2016  . 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 DJD (degenerative joint disease), lumbar 12/29/2014  . Encounter for routine gynecological examination 05/02/2014  . Adnexal mass 04/21/2013  . Osteoarthritis of left knee 02/11/2013  . Heart murmur 02/01/2013  . Diabetes Mellitus 06/17/2012  . Vertigo 02/07/2011  . GERD 01/17/2009  . Goiter 08/27/2006  . Morbid obesity ([place]) 08/27/2006  . HYPERTENSION, BENIGN SYSTEMIC 08/27/2006    Past Surgical History:  Procedure Laterality Date  . CYST EXCISION  03/25/2007   resection peritoneal inclusion cyst  . KNEE ARTHROSCOPY Left 2011  .  KNEE ARTHROSCOPY Right 10/21/2013   Procedure: ARTHROSCOPY RIGHT KNEE;  Surgeon: Alta Corning, MD;  Location: [facility];  Service: Orthopedics;  Laterality: Right;  medial , lateral and patella femoral chondromalsia and medial plica  . LAPAROSCOPY N/A 04/21/2013   Procedure: LAPAROSCOPY DIAGNOSTIC;  Surgeon: Lavonia Drafts, MD;  Location: [facility];  Service: Gynecology;  Laterality: N/A;  . LAPAROTOMY N/A 04/21/2013   Procedure: LAPAROTOMY;  Surgeon: Lavonia Drafts, MD;  Location: [facility];  Service: Gynecology;  Laterality: N/A;  . LYSIS OF ADHESION  08/28/2005; 03/25/2007; 04/21/2013  . SALPINGOOPHORECTOMY Left 04/21/2013   Procedure: SALPINGO OOPHORECTOMY;  Surgeon: Lavonia Drafts, MD;  Location: [facility];  Service: Gynecology;  Laterality: Left;  . TOTAL ABDOMINAL HYSTERECTOMY  08/28/2005  . TOTAL KNEE ARTHROPLASTY Left 02/11/2013   Procedure: LEFT TOTAL KNEE ARTHROPLASTY;  Surgeon: Alta Corning, MD;  Location: [facility];  Service: Orthopedics;  Laterality: Left;  NO COMPUTER  . UMBILICAL HERNIA REPAIR  1984  . UNILATERAL SALPINGECTOMY Right 08/28/2005    OB History    Gravida Para Term Preterm AB Living   6 4 4   2 4    SAB TAB Ectopic Multiple Live Births   2               Home Medications    Prior to Admission medications   Medication Sig Start Date End Date Taking? Authorizing Provider  lisinopril (PRINIVIL,ZESTRIL) 20  MG tablet TAKE 1 TABLET BY MOUTH EVERY DAY 04/16/17   Kinnie Feil, MD  loratadine (CLARITIN) 10 MG tablet Take 10 mg by mouth daily.    [provider]  metFORMIN (GLUCOPHAGE-XR) 500 MG 24 hr tablet TAKE 1 TABLET BY MOUTH DAILY WITH BREAKFAST 07/16/16   Kinnie Feil, MD    Family History Family History  Problem Relation Age of Onset  . Diabetes Mother   . Hypertension Mother   . Hyperlipidemia Mother   . Emphysema Father     Social History Social History   Tobacco Use  . Smoking status: Never Smoker  .  Smokeless tobacco: Never Used  Substance Use Topics  . Alcohol use: No  . Drug use: No     Allergies   Iohexol; Ivp dye [iodinated diagnostic agents]; and Other   Review of Systems Review of Systems  Constitutional: Negative for activity change, chills and fever.  HENT: Negative.   Respiratory: Negative.   Cardiovascular: Negative.   Genitourinary: Positive for frequency and urgency. Negative for dysuria, pelvic pain and vaginal discharge.  Musculoskeletal: Positive for back pain.       As per HPI  Skin: Negative for color change, pallor and rash.  Neurological: Negative.      Physical Exam Triage Vital Signs ED Triage Vitals [05/25/17 1749]  Enc Vitals Group     BP (!) 176/98     Pulse Rate 75     Resp 18     Temp 98.7 F (37.1 C)     Temp Source Oral     SpO2 99 %     Weight      Height      Head Circumference      Peak Flow      Pain Score      Pain Loc      Pain Edu?      Excl. in [place]?    No data found.  Updated Vital Signs BP (!) 176/98 (BP Location: Right Arm)   Pulse 75   Temp 98.7 F (37.1 C) (Oral)   Resp 18   SpO2 99%   Visual Acuity Right Eye Distance:   Left Eye Distance:   Bilateral Distance:    Right Eye Near:   Left Eye Near:    Bilateral Near:     Physical Exam  Constitutional: She is oriented to person, place, and time. She appears well-developed and well-nourished. No distress.  HENT:  Head: Normocephalic and atraumatic.  Eyes: EOM are normal.  Neck: Normal range of motion. Neck supple.  Pulmonary/Chest: Effort normal.  Musculoskeletal: She exhibits no edema or deformity.  Tenderness across the lower most left paralumbar musculature. Having the patient leaned forward, rotate to the right and left reproduces the back pain.  Neurological: She is alert and oriented to person, place, and time. No cranial nerve deficit.  Skin: Skin is warm and dry.  Psychiatric: She has a normal mood and affect.     UC Treatments / Results    Labs (all labs ordered are listed, but only abnormal results are displayed) Labs Reviewed  POCT URINALYSIS DIP (DEVICE) - Abnormal; Notable for the following components:      Result Value   Hgb urine dipstick SMALL (*)    All other components within normal limits    EKG  EKG Interpretation None       Radiology No results found.  Procedures Procedures (including critical care time)  Medications Ordered in UC  Medications - No data to display   Initial Impression / Assessment and Plan / UC Course  I have reviewed the triage vital signs and the nursing notes.  Pertinent labs & imaging results that were available during my care of the patient were reviewed by me and considered in my medical decision making (see chart for details).    Follow-up with your primary care provider for frequency of urination. For your back perform stretches as demonstrated several times a day. Make sure you use good chair with lumbar support. Apply heat.    Final Clinical Impressions(s) / UC Diagnoses   Final diagnoses:  Acute left-sided low back pain without sciatica  Muscle strain  Nocturia    ED Discharge Orders    None       Controlled Substance Prescriptions [place] Controlled Substance Registry consulted? Not Applicable   Janne Napoleon, NP 05/25/17 2115    Janne Napoleon, NP 05/25/17 2116

## 2017-05-25 NOTE — ED Triage Notes (Signed)
Pt here for left sided flank and back pain with some dysuria

## 2017-06-02 NOTE — Progress Notes (Signed)
[facility] Phone: [phone]   Date of Visit: 06/03/2017   HPI:  Urinary frequency: - Reports of urinary frequency for the past 2 weeks.  She sometimes has dysuria.  No suprapubic tenderness.  No fevers or chills. - She was seen at urgent care and UA did not show any signs of infection but did show small hemoglobin. - Has not tried anything for her symptoms -No vaginal discharge, no hematuria - Has a history of type 2 diabetes.  Per chart review diagnosed in May 2017.  A1c has been pretty well controlled.  Patient reports that her metformin was discontinued in November.  She does not check her CBGs.  Left lower back pain: - Reports of left lower back pain that also started 2 weeks ago.  No trauma or repetitive movements. -Characterizes it as an achy pain that starts in the left lower back and travels to the front of her abdomen. -Reports it gets worse if she stands up for long period of time or when she is sleeping at night. -She was seen in urgent care for this and was prescribed a muscle relaxer.  She reports that this medicine only makes her fall asleep but does not improve her symptoms. -She did try ibuprofen 800 mg which helped -Symptoms have been stable, not improving or worsening -History of abdominal surgeries including hysterectomy and salpingo-oophorectomy   ROS: See HPI.  [place]:  PMH: HTN  Hot Flashes GERD Goiter DM OS L Knee Obesity   PHYSICAL EXAM: BP 124/74   Pulse 76   Temp 98.9 F (37.2 C) (Oral)   Ht 5\' 6"  (1.676 m)   Wt 251 lb (113.9 kg)   SpO2 98%   BMI 40.51 kg/m  GEN: NAD CV: RRR, no murmurs, rubs, or gallops PULM: CTAB, normal effort ABD: Soft, nontender, nondistended, NABS, no organomegaly MSK: No midline tenderness to palpation of the entire spine.  There is tenderness to palpation over the left SI joint.  There is normal range of motion of the hips bilaterally.  Straight leg raise does not cause pain to radiate  down her leg but does cause pain in her left back.  Corky Sox produces pain in her left lower back.  Sensation to light touch bilaterally.  Motor strength in lower extremities bilaterally. SKIN: No rash or cyanosis; warm and well-perfused EXTR: No lower extremity edema or calf tenderness PSYCH: Mood and affect euthymic, normal rate and volume of speech NEURO: Awake, alert, no focal deficits grossly, normal speech   ASSESSMENT/PLAN:  Health maintenance:  -Asked patient to follow-up with PCP for Pap smear  Urinary frequency: Her urinalysis is negative for bacterial infection.  She does have small hemoglobin with 0-3 red blood cells and microscopy.  I do not think her back pain is caused by kidney stones.  However per chart review she has had microscopic hematuria since 2014.  She is a never smoker.  Due to persistent microscopic hematuria will refer her to urology.  Her urinalysis resulted after the visit was completed.  I attempted to call her to discuss the plan.  However went to voicemail.  I asked her to call back.  Will hold off on actually ordering the urology referral until I get to speak to patient.  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  We discussed taking ibuprofen 600 mg 3 times daily for about 3 days then as needed.  We discussed monitoring for stomach irritation and to  stop medication if she does have this symptom.  Continue heat packs and try ice.  Provided strengthening and range of motion exercises for SI joint dysfunction.  Smiley Houseman, MD PGY [place]

## 2017-06-03 ENCOUNTER — Telehealth: Payer: Self-pay | Admitting: Internal Medicine

## 2017-06-03 ENCOUNTER — Other Ambulatory Visit: Payer: Self-pay

## 2017-06-03 ENCOUNTER — Ambulatory Visit (INDEPENDENT_AMBULATORY_CARE_PROVIDER_SITE_OTHER): Payer: Managed Care, Other (non HMO) | Admitting: Internal Medicine

## 2017-06-03 ENCOUNTER — Encounter: Payer: Self-pay | Admitting: Internal Medicine

## 2017-06-03 VITALS — BP 124/74 | HR 76 | Temp 98.9°F | Ht 66.0 in | Wt 251.0 lb

## 2017-06-03 DIAGNOSIS — M533 Sacrococcygeal disorders, not elsewhere classified: Secondary | ICD-10-CM

## 2017-06-03 DIAGNOSIS — R3129 Other microscopic hematuria: Secondary | ICD-10-CM | POA: Diagnosis not present

## 2017-06-03 DIAGNOSIS — R350 Frequency of micturition: Secondary | ICD-10-CM | POA: Diagnosis not present

## 2017-06-03 LAB — POCT URINALYSIS DIP (MANUAL ENTRY)
BILIRUBIN UA: NEGATIVE mg/dL
Bilirubin, UA: NEGATIVE
GLUCOSE UA: NEGATIVE mg/dL
Leukocytes, UA: NEGATIVE
Nitrite, UA: NEGATIVE
Protein Ur, POC: NEGATIVE mg/dL
SPEC GRAV UA: 1.02 (ref 1.010–1.025)
UROBILINOGEN UA: 0.2 U/dL
pH, UA: 6 (ref 5.0–8.0)

## 2017-06-03 LAB — POCT UA - MICROSCOPIC ONLY

## 2017-06-03 MED ORDER — IBUPROFEN 600 MG PO TABS: 600.0000 mg | ORAL_TABLET | Freq: Three times a day (TID) | ORAL | 0 refills | Status: DC | PRN

## 2017-06-03 NOTE — Addendum Note (Signed)
Addended by: Smiley Houseman on: 06/03/2017 03:45 PM   Modules accepted: Orders

## 2017-06-03 NOTE — Patient Instructions (Addendum)
Sacroiliac Joint Dysfunction Sacroiliac joint dysfunction is a condition that causes inflammation on one or both sides of the sacroiliac (SI) joint. The SI joint connects the lower part of the spine (sacrum) with the two upper portions of the pelvis (ilium). This condition causes deep aching or burning pain in the low back. In some cases, the pain may also spread into one or both buttocks or hips or spread down the legs. What are the causes? This condition may be caused by:  Pregnancy. During pregnancy, extra stress is put on the SI joints because the pelvis widens.  Injury, such as: ? Car accidents. ? Sport-related injuries. ? Work-related injuries.  Having one leg that is shorter than the other.  Conditions that affect the joints, such as: ? Rheumatoid arthritis. ? Gout. ? Psoriatic arthritis. ? Joint infection (septic arthritis).  Sometimes, the cause of SI joint dysfunction is not known. What are the signs or symptoms? Symptoms of this condition include:  Aching or burning pain in the lower back. The pain may also spread to other areas, such as: ? Buttocks. ? Groin. ? Thighs and legs.  Muscle spasms in or around the painful areas.  Increased pain when standing, walking, running, stair climbing, bending, or lifting.  How is this diagnosed? Your health care provider will do a physical exam and take your medical history. During the exam, the health care provider may move one or both of your legs to different positions to check for pain. Various tests may be done to help verify the diagnosis, including:  Imaging tests to look for other causes of pain. These may include: ? MRI. ? CT scan. ? Bone scan.  Diagnostic injection. A numbing medicine is injected into the SI joint using a needle. If the pain is temporarily improved or stopped after the injection, this can indicate that SI joint dysfunction is the problem.  How is this treated? Treatment may vary depending on the  cause and severity of your condition. Treatment options may include:  Applying ice or heat to the lower back area. This can help to reduce pain and muscle spasms.  Medicines to relieve pain or inflammation or to relax the muscles.  Wearing a back brace (sacroiliac brace) to help support the joint while your back is healing.  Physical therapy to increase muscle strength around the joint and flexibility at the joint. This may also involve learning proper body positions and ways of moving to relieve stress on the joint.  Direct manipulation of the SI joint.  Injections of steroid medicine into the joint in order to reduce pain and swelling.  Radiofrequency ablation to burn away nerves that are carrying pain messages from the joint.  Use of a device that provides electrical stimulation in order to reduce pain at the joint.  Surgery to put in screws and plates that limit or prevent joint motion. This is rare.  Follow these instructions at home:  Rest as needed. Limit your activities as directed by your health care provider.  Take medicines only as directed by your health care provider.  If directed, apply ice to the affected area: ? Put ice in a plastic bag. ? Place a towel between your skin and the bag. ? Leave the ice on for 20 minutes, 2-3 times per day.  Use a heating pad or a moist heat pack as directed by your health care provider.  Exercise as directed by your health care provider or physical therapist.  Keep all follow-up visits   as directed by your health care provider. This is important. Contact a health care provider if:  Your pain is not controlled with medicine.  You have a fever.  You have increasingly severe pain. Get help right away if:  You have weakness, numbness, or tingling in your legs or feet.  You lose control of your bladder or bowel. This information is not intended to replace advice given to you by your health care provider. Make sure you discuss  any questions you have with your health care provider. Document Released: 09/12/2008 Document Revised: 11/22/2015 Document Reviewed: 02/21/2014 Elsevier Interactive Patient Education  2018 [employer].   For your symptoms, please take Ibuprofen 1 tablet three times a day for the next 3 days or so, then take it as needed. Please take with food.  Please stop using the medication if it causes stomach upset.  Please try exercises given in the handout to help strengthen her muscles.  We will get your urine sample today.  I will call you if you need to be started on antibiotics.

## 2017-06-03 NOTE — Telephone Encounter (Signed)
Patient call back clinic.  We discussed her UA showing persistent microscopic hematuria.  Referral to urology made. patient understands plan.

## 2017-06-20 ENCOUNTER — Other Ambulatory Visit: Payer: Self-pay | Admitting: Family Medicine

## 2017-09-21 ENCOUNTER — Other Ambulatory Visit: Payer: Self-pay

## 2017-09-21 ENCOUNTER — Ambulatory Visit: Payer: Managed Care, Other (non HMO) | Admitting: Family Medicine

## 2017-09-21 ENCOUNTER — Encounter: Payer: Self-pay | Admitting: Family Medicine

## 2017-09-21 VITALS — BP 122/82 | HR 84 | Temp 97.8°F | Ht 66.0 in | Wt 259.0 lb

## 2017-09-21 DIAGNOSIS — E119 Type 2 diabetes mellitus without complications: Secondary | ICD-10-CM

## 2017-09-21 DIAGNOSIS — M47816 Spondylosis without myelopathy or radiculopathy, lumbar region: Secondary | ICD-10-CM

## 2017-09-21 LAB — POCT GLYCOSYLATED HEMOGLOBIN (HGB A1C): Hemoglobin A1C: 6.2

## 2017-09-21 MED ORDER — IBUPROFEN 600 MG PO TABS: 600.0000 mg | ORAL_TABLET | Freq: Four times a day (QID) | ORAL | 0 refills | Status: DC | PRN

## 2017-09-21 NOTE — Assessment & Plan Note (Signed)
A1c is 6.2 essentially stable from last A1c back in September which was 6.1.  Despite weight gain, is appears patient is able to maintain good glycemic control diet and exercise.  Discussed resuming low-dose metformin, however patient would like to continue with lifestyle changes for the time being.  We will continue to monitor, could consider resuming oral agent in the future at PCP discretion.

## 2017-09-21 NOTE — Patient Instructions (Addendum)
It was great seeing you today! We have addressed the following issues today  1. Your A1c is  6.2 and stable. Recommend working on your diet and exercise plan as discussed. Will not resume metformin today. 2. For your back pain, appears to be chronic in nature. Continue using ibuprofen  as needed. Weight loss will help with back pain.  If we did any lab work today, and the results require attention, either me or my nurse will get in touch with you. If everything is normal, you will get a letter in mail and a message via . If you don't hear from [place] in two weeks, please give [place] a call. Otherwise, we look forward to seeing you again at your next visit. If you have any questions or concerns before then, please call the clinic at [phone].  Please bring all your medications to every doctors visit  Sign up for My Chart to have easy access to your labs results, and communication with your Primary care physician. Please ask Front Desk for some assistance.   Please check-out at the front desk before leaving the clinic.    Take Care,   Dr. Andy Gauss

## 2017-09-21 NOTE — Assessment & Plan Note (Signed)
Patient with history of chronic low back pain.  X-ray of lumbar area consistent with degenerative changes back in 2016.  Pain described today's visit seems to be consistent with chronic lumbar pain.  No radiculopathy or red flags suspicious for infectious process or malignancy.  Physical exam consistent with lumbar pain.  Given intermittent pain likely attributed to arthritic changes.  Symptoms fairly well controlled with high-dose NSAIDs and no red flags would not repeat x-ray of lumbar region to assess progression.  Discussed weight loss as an additional way to help with lumbar pain.  For the time being we will continue with NSAIDs.  Reevaluate at next office visit with PCP.

## 2017-09-21 NOTE — Progress Notes (Signed)
Subjective:    Patient ID: Cynthia Wilson, female    DOB: [birth year], 52 y.o.   MRN: [id number]   CC: Follow-up for type 2 diabetes   HPI: Patient is a 52 year old female who presents today to follow-up on type 2 diabetes management.  Patient is A1c was 6.1 in September 2018.  Metformin was discontinued and by PCP after discussing with patient who opted for therapeutic lifestyle changes.  Patient reports weight gain in the past 3 months of almost 10 pounds, slight concern about A1c however confident that her diabetes is still well controlled.  Patient denies any polyuria polydipsia.  Patient reports that she just enrolled into an exercise class, however she has been unable to attend due to back pain.   Back pain: Patient reports intermittent worsening chronic back pain in the past few weeks.  Patient has been using ibuprofen with mild improvement in pain.  Patient reports that pain is worse when she walks long distances.  Pain is also exacerbated with movement at night while in bed.  She denies any radiation to her lower extremity.  Moderate pain located to her lumbar region.  No saddle anesthesia, urinary or stool incontinence, fever or chill.   Smoking status reviewed   ROS: all other systems were reviewed and are negative other than in the HPI   Past Medical History:  Diagnosis Date  . GERD (gastroesophageal reflux disease)   . History of osteoarthritis    left knee  . Hypertension    under control with med., has been on med. x 5 yr.  . Immature cataract   . KNEE PAIN, LEFT, CHRONIC 07/30/2009   Seen by [facility] orth[facility] 03/31/12,DJD,  steroid injection   . Medial meniscus tear 09/2013   right knee  . Non-insulin dependent type 2 diabetes mellitus ([place])   . 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 Sinus headache 10/30/2015  . Wears partial dentures    upper    Past Surgical History:  Procedure Laterality Date  . CYST EXCISION  03/25/2007   resection peritoneal inclusion  cyst  . KNEE ARTHROSCOPY Left 2011  . KNEE ARTHROSCOPY Right 10/21/2013   Procedure: ARTHROSCOPY RIGHT KNEE;  Surgeon: Alta Corning, MD;  Location: [facility];  Service: Orthopedics;  Laterality: Right;  medial , lateral and patella femoral chondromalsia and medial plica  . LAPAROSCOPY N/A 04/21/2013   Procedure: LAPAROSCOPY DIAGNOSTIC;  Surgeon: Lavonia Drafts, MD;  Location: [facility];  Service: Gynecology;  Laterality: N/A;  . LAPAROTOMY N/A 04/21/2013   Procedure: LAPAROTOMY;  Surgeon: Lavonia Drafts, MD;  Location: [facility];  Service: Gynecology;  Laterality: N/A;  . LYSIS OF ADHESION  08/28/2005; 03/25/2007; 04/21/2013  . SALPINGOOPHORECTOMY Left 04/21/2013   Procedure: SALPINGO OOPHORECTOMY;  Surgeon: Lavonia Drafts, MD;  Location: [facility];  Service: Gynecology;  Laterality: Left;  . TOTAL ABDOMINAL HYSTERECTOMY  08/28/2005  . TOTAL KNEE ARTHROPLASTY Left 02/11/2013   Procedure: LEFT TOTAL KNEE ARTHROPLASTY;  Surgeon: Alta Corning, MD;  Location: [facility];  Service: Orthopedics;  Laterality: Left;  NO COMPUTER  . UMBILICAL HERNIA REPAIR  1984  . UNILATERAL SALPINGECTOMY Right 08/28/2005    Past medical history, surgical, family, and social history reviewed and updated in the EMR as appropriate.  Objective:  BP 122/82   Pulse 84   Temp 97.8 F (36.6 C) (Oral)   Ht 5\' 6"  (1.676 m)   Wt 259 lb (117.5 kg)   SpO2 96%   BMI 41.80 kg/m  Vitals and nursing note reviewed  General: NAD, pleasant, able to participate in exam Cardiac: RRR, normal heart sounds, no murmurs. 2+ radial and PT pulses bilaterally Respiratory: CTAB, normal effort, No wheezes, rales or rhonchi Abdomen: soft, nontender, nondistended, no hepatic or splenomegaly, +BS Mildly tender to palpation in the lumbar area.  Straight leg raise negative for radiation to lower extremity.  No pain with internal/external rotation of the hip.  Mildly reduce flexio at n extension range. Extremities: no  edema or cyanosis. WWP. Skin: warm and dry, no rashes noted Neuro: alert and oriented x4, no focal deficits Psych: Normal affect and mood   Assessment & Plan:    Diabetes Mellitus A1c is 6.2 essentially stable from last A1c back in September which was 6.1.  Despite weight gain, is appears patient is able to maintain good glycemic control diet and exercise.  Discussed resuming low-dose metformin, however patient would like to continue with lifestyle changes for the time being.  We will continue to monitor, could consider resuming oral agent in the future at PCP discretion.  DJD (degenerative joint disease), lumbar Patient with history of chronic low back pain.  X-ray of lumbar area consistent with degenerative changes back in 2016.  Pain described today's visit seems to be consistent with chronic lumbar pain.  No radiculopathy or red flags suspicious for infectious process or malignancy.  Physical exam consistent with lumbar pain.  Given intermittent pain likely attributed to arthritic changes.  Symptoms fairly well controlled with high-dose NSAIDs and no red flags would not repeat x-ray of lumbar region to assess progression.  Discussed weight loss as an additional way to help with lumbar pain.  For the time being we will continue with NSAIDs.  Reevaluate at next office visit with PCP.    Marjie Skiff, MD [place] PGY-2

## 2017-10-07 ENCOUNTER — Other Ambulatory Visit: Payer: Self-pay | Admitting: Family Medicine

## 2017-10-19 ENCOUNTER — Other Ambulatory Visit: Payer: Self-pay | Admitting: Family Medicine

## 2017-10-19 DIAGNOSIS — Z1231 Encounter for screening mammogram for malignant neoplasm of breast: Secondary | ICD-10-CM

## 2017-11-09 ENCOUNTER — Ambulatory Visit
Admission: RE | Admit: 2017-11-09 | Discharge: 2017-11-09 | Disposition: A | Discharge status: Home / Self Care | Payer: Managed Care, Other (non HMO) | Source: Ambulatory Visit | Attending: Family Medicine | Admitting: Family Medicine

## 2017-11-09 DIAGNOSIS — Z1231 Encounter for screening mammogram for malignant neoplasm of breast: Secondary | ICD-10-CM

## 2017-12-01 ENCOUNTER — Other Ambulatory Visit: Payer: Self-pay

## 2017-12-01 ENCOUNTER — Telehealth: Payer: Self-pay

## 2017-12-01 ENCOUNTER — Ambulatory Visit (INDEPENDENT_AMBULATORY_CARE_PROVIDER_SITE_OTHER): Payer: Managed Care, Other (non HMO) | Admitting: Family Medicine

## 2017-12-01 ENCOUNTER — Encounter: Payer: Self-pay | Admitting: Family Medicine

## 2017-12-01 VITALS — BP 126/70 | HR 73 | Temp 98.5°F | Ht 66.0 in | Wt 244.0 lb

## 2017-12-01 DIAGNOSIS — M545 Low back pain, unspecified: Secondary | ICD-10-CM

## 2017-12-01 DIAGNOSIS — Z1211 Encounter for screening for malignant neoplasm of colon: Secondary | ICD-10-CM

## 2017-12-01 DIAGNOSIS — G8929 Other chronic pain: Secondary | ICD-10-CM

## 2017-12-01 DIAGNOSIS — M47816 Spondylosis without myelopathy or radiculopathy, lumbar region: Secondary | ICD-10-CM | POA: Diagnosis not present

## 2017-12-01 DIAGNOSIS — E0800 Diabetes mellitus due to underlying condition with hyperosmolarity without nonketotic hyperglycemic-hyperosmolar coma (NKHHC): Secondary | ICD-10-CM

## 2017-12-01 LAB — POCT GLYCOSYLATED HEMOGLOBIN (HGB A1C): HBA1C, POC (CONTROLLED DIABETIC RANGE): 6 % (ref 0.0–7.0)

## 2017-12-01 MED ORDER — NAPROXEN 500 MG PO TBEC: 500.0000 mg | DELAYED_RELEASE_TABLET | Freq: Two times a day (BID) | ORAL | 0 refills | Status: DC

## 2017-12-01 NOTE — Assessment & Plan Note (Signed)
Xray lumbar spine ordered. PT referral recommended. She will like to defer till after she get the xray done. D/C Ibuprofen. Start Naproxen BID and Tylenol for breakthrough pain. Continue home back exercise. F/U as needed.

## 2017-12-01 NOTE — Assessment & Plan Note (Signed)
Off meds. A1C check today. Will call with result and discuss plan with her base on the result.

## 2017-12-01 NOTE — Progress Notes (Signed)
Subjective:     Patient ID: Cynthia Wilson, female   DOB: 1966-01-15, 53 y.o.   MRN: [id number]  Back Pain  This is a chronic problem. The current episode started more than 1 year ago. The problem occurs constantly. The problem has been gradually worsening since onset. The pain is present in the lumbar spine (Left lower back radiated to the from area on her stomach). The quality of the pain is described as aching (Dull). The pain radiates to the left thigh (Front of her stomach). The pain is at a severity of 5/10. The pain is moderate. The pain is worse during the night. The symptoms are aggravated by sitting and position. Stiffness is present: No stiffness. Pertinent negatives include no dysuria, fever, paresthesias or tingling. Risk factors include obesity. She has tried NSAIDs and home exercises for the symptoms. The treatment provided moderate relief.   DM2:Doing well off Metformin. Here for follow-up Weight:She started fitness class in March. DOing well on exercise and diet. Here for follow-up. HM: here for routine health care.  Current Outpatient Medications on File Prior to Visit  Medication Sig Dispense Refill  . cyclobenzaprine (FLEXERIL) 5 MG tablet One or 2 tabs every 8 hours as needed for muscle spasm. May cause drowsiness. 20 tablet 0  . ibuprofen (ADVIL,MOTRIN) 600 MG tablet Take 1 tablet (600 mg total) by mouth every 6 (six) hours as needed for mild pain or moderate pain. 30 tablet 0  . lisinopril (PRINIVIL,ZESTRIL) 20 MG tablet TAKE 1 TABLET BY MOUTH EVERY DAY 90 tablet 1  . loratadine (CLARITIN) 10 MG tablet Take 10 mg by mouth daily.    . metFORMIN (GLUCOPHAGE-XR) 500 MG 24 hr tablet TAKE 1 TABLET BY MOUTH DAILY WITH BREAKFAST 90 tablet 2   No current facility-administered medications on file prior to visit.    Past Medical History:  Diagnosis Date  . GERD (gastroesophageal reflux disease)   . History of osteoarthritis    left knee  . Hypertension    under control with  med., has been on med. x 5 yr.  . Immature cataract   . KNEE PAIN, LEFT, CHRONIC 07/30/2009   Seen by [facility] Argue ortho 03/31/12,DJD,  steroid injection   . Medial meniscus tear 09/2013   right knee  . Non-insulin dependent type 2 diabetes mellitus ([place])   . 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 Sinus headache 10/30/2015  . Wears partial dentures    upper   Vitals:   12/01/17 0850  BP: 126/70  Pulse: 73  Temp: 98.5 F (36.9 C)  TempSrc: Oral  Weight: 244 lb (110.7 kg)  Height: 5\' 6"  (1.676 m)     Review of Systems  Constitutional: Negative for fever.  Respiratory: Negative.   Cardiovascular: Negative.   Gastrointestinal: Negative.   Genitourinary: Negative for dysuria.  Musculoskeletal: Positive for back pain.  Neurological: Negative for tingling and paresthesias.  All other systems reviewed and are negative.      Objective:   Physical Exam  Constitutional: She appears well-developed. No distress.  Cardiovascular: Normal rate, regular rhythm and normal heart sounds.  No murmur heard. Pulmonary/Chest: Effort normal and breath sounds normal. No stridor. No respiratory distress.  Abdominal: Soft. Bowel sounds are normal. She exhibits no distension and no mass. There is no tenderness.  Musculoskeletal: Normal range of motion. She exhibits no edema.       Lumbar back: She exhibits tenderness. She exhibits normal range of motion, no deformity and no spasm.  Sensory  exam of the foot is normal, tested with the monofilament. Good pulses, no lesions or ulcers, good peripheral pulses.   Nursing note and vitals reviewed.      Assessment:     Chronic lumbar pain DM2 well controlled Obesity Health maintenance    Plan:     Check problem list.  For health maintenance, pneumonia shot recommended but she declined. She agreed on colon cancer screening. Referral to GI done.

## 2017-12-01 NOTE — Patient Instructions (Signed)

## 2017-12-01 NOTE — Addendum Note (Signed)
Addended by: Andrena Mews T on: 12/01/2017 12:08 PM   Modules accepted: Orders

## 2017-12-01 NOTE — Addendum Note (Signed)
Addended by: Andrena Mews T on: 12/01/2017 11:37 AM   Modules accepted: Orders

## 2017-12-01 NOTE — Assessment & Plan Note (Signed)
Weight down few pounds. She was commended. Continue exercise plan and diet adjustment. Monitor for improvement.

## 2017-12-01 NOTE — Telephone Encounter (Signed)
Refill completed. Patient informed.

## 2017-12-01 NOTE — Telephone Encounter (Signed)
Patient left message on nurse line that she was expecting a prescription to be sent to her pharmacy from her OV this am.  Call back is [phone]  Danley Danker, RN [facility] RN)

## 2017-12-04 ENCOUNTER — Ambulatory Visit (HOSPITAL_COMMUNITY)
Admission: RE | Admit: 2017-12-04 | Discharge: 2017-12-04 | Disposition: A | Discharge status: Home / Self Care | Payer: Managed Care, Other (non HMO) | Source: Ambulatory Visit | Attending: Family Medicine | Admitting: Family Medicine

## 2017-12-04 ENCOUNTER — Telehealth: Payer: Self-pay | Admitting: Family Medicine

## 2017-12-04 DIAGNOSIS — M545 Low back pain, unspecified: Secondary | ICD-10-CM

## 2017-12-04 DIAGNOSIS — G8929 Other chronic pain: Secondary | ICD-10-CM

## 2017-12-04 DIAGNOSIS — M5136 Other intervertebral disc degeneration, lumbar region: Secondary | ICD-10-CM | POA: Insufficient documentation

## 2017-12-04 NOTE — Telephone Encounter (Signed)
Xray result discussed with her. PT referral recommended. However, she declined referral. She stated Naprosyn is helping and will like to continue that for now with home exercise.  A1C result also discussed with her. Continue diet and exercise control.

## 2017-12-07 ENCOUNTER — Encounter: Payer: Self-pay | Admitting: Gastroenterology

## 2017-12-29 ENCOUNTER — Other Ambulatory Visit: Payer: Self-pay | Admitting: Family Medicine

## 2018-01-11 ENCOUNTER — Other Ambulatory Visit: Payer: Self-pay

## 2018-01-11 ENCOUNTER — Ambulatory Visit (AMBULATORY_SURGERY_CENTER): Payer: Self-pay | Admitting: *Deleted

## 2018-01-11 ENCOUNTER — Encounter: Payer: Self-pay | Admitting: Gastroenterology

## 2018-01-11 VITALS — Ht 66.0 in | Wt 242.0 lb

## 2018-01-11 DIAGNOSIS — Z1211 Encounter for screening for malignant neoplasm of colon: Secondary | ICD-10-CM

## 2018-01-11 MED ORDER — NA SULFATE-K SULFATE-MG SULF 17.5-3.13-1.6 GM/177ML PO SOLN: 1.0000 | Freq: Once | ORAL | 0 refills | Status: AC

## 2018-01-11 NOTE — Progress Notes (Signed)
No egg or soy allergy known to patient  No issues with past sedation with any surgeries  or procedures, no intubation problems  No diet pills per patient No home 02 use per patient  No blood thinners per patient  Pt denies issues with constipation  No A fib or A flutter  EMMI video sent to pt's e mail - pt declined  Online Suprep $15 coupon to pt in [place] today - plenvu ordered and came up as a non covered prep- suprep preferred 1 so sent in [place] instead

## 2018-01-20 ENCOUNTER — Encounter: Payer: Self-pay | Admitting: Gastroenterology

## 2018-01-20 ENCOUNTER — Ambulatory Visit (AMBULATORY_SURGERY_CENTER): Payer: Managed Care, Other (non HMO) | Admitting: Gastroenterology

## 2018-01-20 VITALS — BP 143/79 | HR 64 | Temp 99.3°F | Resp 12 | Ht 66.0 in | Wt 242.0 lb

## 2018-01-20 DIAGNOSIS — K635 Polyp of colon: Secondary | ICD-10-CM

## 2018-01-20 DIAGNOSIS — Z1211 Encounter for screening for malignant neoplasm of colon: Secondary | ICD-10-CM | POA: Diagnosis present

## 2018-01-20 DIAGNOSIS — D122 Benign neoplasm of ascending colon: Secondary | ICD-10-CM | POA: Diagnosis not present

## 2018-01-20 MED ORDER — SODIUM CHLORIDE 0.9 % IV SOLN: 500.0000 mL | Freq: Once | INTRAVENOUS | Status: DC

## 2018-01-20 NOTE — Progress Notes (Signed)
Pt's states no medical or surgical changes since previsit or office visit. 

## 2018-01-20 NOTE — Patient Instructions (Signed)
Discharge instructions given. Handout on polyps. Resume previous medications. YOU HAD AN ENDOSCOPIC PROCEDURE TODAY AT THE  ENDOSCOPY CENTER:   Refer to the procedure report that was given to you for any specific questions about what was found during the examination.  If the procedure report does not answer your questions, please call your gastroenterologist to clarify.  If you requested that your care partner not be given the details of your procedure findings, then the procedure report has been included in a sealed envelope for you to review at your convenience later.  YOU SHOULD EXPECT: Some feelings of bloating in the abdomen. Passage of more gas than usual.  Walking can help get rid of the air that was put into your GI tract during the procedure and reduce the bloating. If you had a lower endoscopy (such as a colonoscopy or flexible sigmoidoscopy) you may notice spotting of blood in your stool or on the toilet paper. If you underwent a bowel prep for your procedure, you may not have a normal bowel movement for a few days.  Please Note:  You might notice some irritation and congestion in your nose or some drainage.  This is from the oxygen used during your procedure.  There is no need for concern and it should clear up in a day or so.  SYMPTOMS TO REPORT IMMEDIATELY:   Following lower endoscopy (colonoscopy or flexible sigmoidoscopy):  Excessive amounts of blood in the stool  Significant tenderness or worsening of abdominal pains  Swelling of the abdomen that is new, acute  Fever of 100F or higher   For urgent or emergent issues, a gastroenterologist can be reached at any hour by calling [phone].   DIET:  We do recommend a small meal at first, but then you may proceed to your regular diet.  Drink plenty of fluids but you should avoid alcoholic beverages for 24 hours.  ACTIVITY:  You should plan to take it easy for the rest of today and you should NOT DRIVE or use heavy  machinery until tomorrow (because of the sedation medicines used during the test).    FOLLOW UP: Our staff will call the number listed on your records the next business day following your procedure to check on you and address any questions or concerns that you may have regarding the information given to you following your procedure. If we do not reach you, we will leave a message.  However, if you are feeling well and you are not experiencing any problems, there is no need to return our call.  We will assume that you have returned to your regular daily activities without incident.  If any biopsies were taken you will be contacted by phone or by letter within the next 1-3 weeks.  Please call us at [phone] if you have not heard about the biopsies in 3 weeks.    SIGNATURES/CONFIDENTIALITY: You and/or your care partner have signed paperwork which will be entered into your electronic medical record.  These signatures attest to the fact that that the information above on your After Visit Summary has been reviewed and is understood.  Full responsibility of the confidentiality of this discharge information lies with you and/or your care-partner. 

## 2018-01-20 NOTE — Progress Notes (Signed)
To PACU  Pt awake an alert. Report to RN

## 2018-01-20 NOTE — Progress Notes (Signed)
Called to room to assist during endoscopic procedure.  Patient ID and intended procedure confirmed with present staff. Received instructions for my participation in the procedure from the performing physician.  

## 2018-01-20 NOTE — Op Note (Addendum)
[facility] Patient Name: Cynthia Wilson Procedure Date: 01/20/2018 2:42 PM MRN: [id number] Endoscopist: Mallie Mussel L. Loletha Carrow , MD Age: 52 Referring MD:  Date of Birth: [birth year] Gender: Female Account #: [id number] Procedure:                Colonoscopy Indications:              Screening for colorectal malignant neoplasm, This                            is the patient's first screening colonoscopy Medicines:                Monitored Anesthesia Care Procedure:                Pre-Anesthesia Assessment:                           - Prior to the procedure, a History and Physical                            was performed, and patient medications and                            allergies were reviewed. The patient's tolerance of                            previous anesthesia was also reviewed. The risks                            and benefits of the procedure and the sedation                            options and risks were discussed with the patient.                            All questions were answered, and informed consent                            was obtained. Prior Anticoagulants: The patient has                            taken no previous anticoagulant or antiplatelet                            agents. ASA Grade Assessment: II - A patient with                            mild systemic disease. After reviewing the risks                            and benefits, the patient was deemed in                            satisfactory condition to undergo the procedure.  After obtaining informed consent, the colonoscope                            was passed under direct vision. Throughout the                            procedure, the patient's blood pressure, pulse, and                            oxygen saturations were monitored continuously. The                            Colonoscope was introduced through the anus and                            advanced to the  the cecum, identified by                            appendiceal orifice and ileocecal valve. The                            colonoscopy was performed without difficulty. The                            patient tolerated the procedure well. The quality                            of the bowel preparation was good. The ileocecal                            valve, appendiceal orifice, and rectum were                            photographed. The quality of the bowel preparation                            was evaluated using the BBPS [facility] Bowel                            Preparation Scale) with scores of: Right Colon = 2,                            Transverse Colon = 2 and Left Colon = 2. The total                            BBPS score equals 6. Scope In: 2:45:44 PM Scope Out: 3:00:19 PM Scope Withdrawal Time: 0 hours 10 minutes 13 seconds  Total Procedure Duration: 0 hours 14 minutes 35 seconds  Findings:                 The perianal and digital rectal examinations were                            normal.  A 8-10 mm polyp was found in the distal ascending                            colon. The polyp was sessile. The polyp was removed                            with a cold snare. Resection and retrieval were                            complete.                           The exam was otherwise without abnormality on                            direct and retroflexion views. Complications:            No immediate complications. Estimated Blood Loss:     Estimated blood loss was minimal. Impression:               - One 8-10 mm polyp in the distal ascending colon,                            removed with a cold snare. Resected and retrieved.                           - The examination was otherwise normal on direct                            and retroflexion views. Recommendation:           - Patient has a contact number available for                            emergencies.  The signs and symptoms of potential                            delayed complications were discussed with the                            patient. Return to normal activities tomorrow.                            Written discharge instructions were provided to the                            patient.                           - Resume previous diet.                           - Continue present medications.                           - Await pathology results.                           -  Repeat colonoscopy is recommended for                            surveillance. The colonoscopy date will be                            determined after pathology results from today's                            exam become available for review. Henry L. Loletha Carrow, MD 01/20/2018 3:03:45 PM This report has been signed electronically.

## 2018-01-21 ENCOUNTER — Telehealth: Payer: Self-pay

## 2018-01-21 NOTE — Telephone Encounter (Signed)
  Follow up Call-  Call back number 01/20/2018  Post procedure Call Back phone  # [phone]  Permission to leave phone message Yes  Some recent data might be hidden     Patient questions:  Do you have a fever, pain , or abdominal swelling? No. Pain Score  0 *  Have you tolerated food without any problems? Yes.    Have you been able to return to your normal activities? Yes.    Do you have any questions about your discharge instructions: Diet   No. Medications  No. Follow up visit  No.  Do you have questions or concerns about your Care? No.  Actions: * If pain score is 4 or above: No action needed, pain <4.

## 2018-01-27 ENCOUNTER — Encounter: Payer: Self-pay | Admitting: Gastroenterology

## 2018-02-08 ENCOUNTER — Other Ambulatory Visit: Payer: Self-pay

## 2018-02-08 ENCOUNTER — Ambulatory Visit (INDEPENDENT_AMBULATORY_CARE_PROVIDER_SITE_OTHER): Payer: Managed Care, Other (non HMO) | Admitting: Student in an Organized Health Care Education/Training Program

## 2018-02-08 ENCOUNTER — Encounter: Payer: Self-pay | Admitting: Student in an Organized Health Care Education/Training Program

## 2018-02-08 VITALS — BP 142/88 | HR 58 | Temp 98.9°F | Ht 66.0 in | Wt 239.6 lb

## 2018-02-08 DIAGNOSIS — M654 Radial styloid tenosynovitis [de Quervain]: Secondary | ICD-10-CM

## 2018-02-08 MED ORDER — IBUPROFEN 600 MG PO TABS: 600.0000 mg | ORAL_TABLET | Freq: Three times a day (TID) | ORAL | 0 refills | Status: DC | PRN

## 2018-02-08 NOTE — Progress Notes (Signed)
   CC: right hand pain  HPI: Cynthia Wilson is a 52 y.o. female with PMH significant for HTN, GERD, T2DM, OA who presents to [facility] today with right hand pain of several weeks duration.   Patient endorses worsening right hand and wrist pain over the past several weeks. Pain is worst when she bears weight while exercising such as during pushups. She also notices pain when she stretches the wrist. She has tried ibuprofen and ice, however pain continues to worsen. No known injury, trauma. Pain did not come on suddenly. No other joint or MSK pain.  Review of Symptoms:  See HPI for ROS.   CC, SH/smoking status, and VS noted.  Objective: BP (!) 142/88   Pulse (!) 58   Temp 98.9 F (37.2 C) (Oral)   Ht 5\' 6"  (1.676 m)   Wt 239 lb 9.6 oz (108.7 kg)   SpO2 98%   BMI 38.67 kg/m  GEN: NAD, alert, cooperative, and pleasant Wrist, Right: TTP over right medial wrist. Inspection yielded no erythema, ecchymosis, bony deformity, or swelling. ROM full with good flexion and extension and ulnar/radial deviation that is symmetrical with opposite wrist. Palpation is normal over metacarpals, scaphoid, lunate. +Finkelstein, negative tinel and phalens. Strength 5/5 in all directions. No pain with loading the base of the first metacarpal.   Assessment and plan:  DeQuervain's tenosynovitis  - thumb spica splint, particularly when exercising - ice/heat - NSAIDS PRN - continue exercising, just modify to avoid pain - follow up 4 weeks   Everrett Coombe, MD,MS,  PGY3 02/08/2018 3:29 PM

## 2018-02-08 NOTE — Patient Instructions (Signed)
It was a pleasure seeing you today in our clinic. Here is the treatment plan we have discussed and agreed upon together:  Please schedule follow up to be seen in our office in 4 weeks.  Our clinic's number is [phone]. Please call with questions or concerns about what we discussed today.  Be well, Dr. Burr Medico

## 2018-02-14 ENCOUNTER — Encounter (HOSPITAL_COMMUNITY): Payer: Self-pay

## 2018-02-14 ENCOUNTER — Ambulatory Visit (HOSPITAL_COMMUNITY)
Admission: EM | Admit: 2018-02-14 | Discharge: 2018-02-14 | Disposition: A | Discharge status: Home / Self Care | Payer: Managed Care, Other (non HMO) | Attending: Family Medicine | Admitting: Family Medicine

## 2018-02-14 DIAGNOSIS — M654 Radial styloid tenosynovitis [de Quervain]: Secondary | ICD-10-CM | POA: Diagnosis not present

## 2018-02-14 MED ORDER — METHYLPREDNISOLONE 4 MG PO TBPK: ORAL_TABLET | ORAL | 0 refills | Status: DC

## 2018-02-14 NOTE — ED Triage Notes (Signed)
Pt presents with pain in right wrist and thumb , ongoing from an office visit on 02/08/2018 where she was diagnosed with tenosynoviitis

## 2018-02-14 NOTE — ED Provider Notes (Signed)
[facility]    CSN: [id number] Arrival date & time: 02/14/18  1437    History   Chief Complaint Chief Complaint  Patient presents with  . Pain right wrist and thumb    HPI Cynthia Wilson is a 52 y.o. female.   HPI  Patient was diagnosed with de Quervain's tenosynovitis.  She is in a thumb spica.  She is taking as needed ibuprofen.  She is concerned because she still has pain.  She feels like something is "moving" in her wrist when she moves her thumb.  She had no accident, no injury.  She is not sure how she strained her thumb.  She was diagnosed at her family practice office.  She is mildly concerned that she did not have an x-ray performed.  Past Medical History:  Diagnosis Date  . Allergy   . Arthritis   . GERD (gastroesophageal reflux disease)   . Heart murmur   . History of osteoarthritis    left knee  . Hypertension    under control with med., has been on med. x 5 yr.  . Immature cataract    small   . KNEE PAIN, LEFT, CHRONIC 07/30/2009   Seen by [facility] orth[facility] 03/31/12,DJD,  steroid injection   . Medial meniscus tear 09/2013   right knee  . Non-insulin dependent type 2 diabetes mellitus ([place])    off metformin- diet controlled per pt   . 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 Sinus headache 10/30/2015  . Wears partial dentures    upper    Patient Active Problem List   Diagnosis Date Noted  . Recurrent tonsillitis 12/01/2016  . Hot flashes 10/24/2016  . Multinodular goiter 07/25/2016  . 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 DJD (degenerative joint disease), lumbar 12/29/2014  . Adnexal mass 04/21/2013  . Osteoarthritis of left knee 02/11/2013  . Heart murmur 02/01/2013  . Diabetes Mellitus 06/17/2012  . Vertigo 02/07/2011  . GERD 01/17/2009  . Goiter 08/27/2006  . Morbid obesity ([place]) 08/27/2006  . HYPERTENSION, BENIGN SYSTEMIC 08/27/2006    Past Surgical History:  Procedure Laterality Date  . COLONOSCOPY  2009   normal   .  CYST EXCISION  03/25/2007   resection peritoneal inclusion cyst  . KNEE ARTHROSCOPY Left 2011  . KNEE ARTHROSCOPY Right 10/21/2013   Procedure: ARTHROSCOPY RIGHT KNEE;  Surgeon: Alta Corning, MD;  Location: [facility];  Service: Orthopedics;  Laterality: Right;  medial , lateral and patella femoral chondromalsia and medial plica  . LAPAROSCOPY N/A 04/21/2013   Procedure: LAPAROSCOPY DIAGNOSTIC;  Surgeon: Lavonia Drafts, MD;  Location: [facility];  Service: Gynecology;  Laterality: N/A;  . LAPAROTOMY N/A 04/21/2013   Procedure: LAPAROTOMY;  Surgeon: Lavonia Drafts, MD;  Location: [facility];  Service: Gynecology;  Laterality: N/A;  . LYSIS OF ADHESION  08/28/2005; 03/25/2007; 04/21/2013  . SALPINGOOPHORECTOMY Left 04/21/2013   Procedure: SALPINGO OOPHORECTOMY;  Surgeon: Lavonia Drafts, MD;  Location: [facility];  Service: Gynecology;  Laterality: Left;  . TOTAL ABDOMINAL HYSTERECTOMY  08/28/2005  . TOTAL KNEE ARTHROPLASTY Left 02/11/2013   Procedure: LEFT TOTAL KNEE ARTHROPLASTY;  Surgeon: Alta Corning, MD;  Location: [facility];  Service: Orthopedics;  Laterality: Left;  NO COMPUTER  . UMBILICAL HERNIA REPAIR  1984  . UNILATERAL SALPINGECTOMY Right 08/28/2005    OB History    Gravida  6   Para  4   Term  4   Preterm      AB  2  Living  4     SAB  2   TAB      Ectopic      Multiple      Live Births               Home Medications    Prior to Admission medications   Medication Sig Start Date End Date Taking? Authorizing Provider  fluticasone (FLONASE) 50 MCG/ACT nasal spray SPRAY 2 SPRAYS INTO EACH NOSTRIL EVERY DAY 11/29/17   [provider]  ibuprofen (ADVIL,MOTRIN) 600 MG tablet Take 1 tablet (600 mg total) by mouth every 8 (eight) hours as needed. 02/08/18   Everrett Coombe, MD  lisinopril (PRINIVIL,ZESTRIL) 20 MG tablet TAKE 1 TABLET BY MOUTH EVERY DAY 10/07/17   McDiarmid, Blane Ohara, MD  loratadine (CLARITIN) 10 MG tablet Take 10 mg by mouth  daily.    [provider]  methylPREDNISolone (MEDROL DOSEPAK) 4 MG TBPK tablet tad 02/14/18   Raylene Everts, MD  Multiple Vitamin (MULTIVITAMIN) tablet Take 1 tablet by mouth daily. Womens Ultra Mega Vitamin    [provider]    Family History Family History  Problem Relation Age of Onset  . Diabetes Mother   . Hypertension Mother   . Hyperlipidemia Mother   . Colon polyps Mother   . Emphysema Father   . Breast cancer Cousin 27  . Colon polyps Sister   . Esophageal cancer Brother   . Colon cancer Neg Hx   . Rectal cancer Neg Hx   . Stomach cancer Neg Hx     Social History Social History   Tobacco Use  . Smoking status: Never Smoker  . Smokeless tobacco: Never Used  Substance Use Topics  . Alcohol use: No  . Drug use: No     Allergies   Iohexol; Ivp dye [iodinated diagnostic agents]; and Other   Review of Systems Review of Systems  Constitutional: Negative for chills and fever.  HENT: Negative for ear pain and sore throat.   Eyes: Negative for pain and visual disturbance.  Respiratory: Negative for cough and shortness of breath.   Cardiovascular: Negative for chest pain and palpitations.  Gastrointestinal: Negative for abdominal pain and vomiting.  Genitourinary: Negative for dysuria and hematuria.  Musculoskeletal: Negative for arthralgias and back pain.       Right thumb pain, weak grip  Skin: Negative for color change and rash.  Neurological: Negative for seizures and syncope.  All other systems reviewed and are negative.    Physical Exam Triage Vital Signs ED Triage Vitals  Enc Vitals Group     BP 02/14/18 1515 (!) 158/89     Pulse Rate 02/14/18 1515 68     Resp 02/14/18 1515 20     Temp 02/14/18 1515 98.1 F (36.7 C)     Temp Source 02/14/18 1515 Temporal     SpO2 02/14/18 1515 100 %     Weight --      Height --      Head Circumference --      Peak Flow --      Pain Score 02/14/18 1514 4     Pain Loc --      Pain Edu?  --      Excl. in [place]? --    No data found.  Updated Vital Signs BP (!) 158/89 (BP Location: Left Arm)   Pulse 68   Temp 98.1 F (36.7 C) (Temporal)   Resp 20   SpO2 100%  Physical Exam  Constitutional: She appears well-developed and well-nourished. No distress.  HENT:  Head: Normocephalic and atraumatic.  Mouth/Throat: Oropharynx is clear and moist.  Eyes: Pupils are equal, round, and reactive to light. Conjunctivae are normal.  Neck: Normal range of motion.  Cardiovascular: Normal rate.  Pulmonary/Chest: Effort normal. No respiratory distress.  Abdominal: Soft. She exhibits no distension.  Musculoskeletal: Normal range of motion. She exhibits no edema.  Patient is wearing an appropriate thumb spica brace.  This is removed.  She has tenderness over the dorsal radial compartment.  She is positive Finkelstein's testing.  Mild crepitus with thumb range of motion.  Neurological: She is alert.  Skin: Skin is warm and dry.     UC Treatments / Results  Labs (all labs ordered are listed, but only abnormal results are displayed) Labs Reviewed - No data to display  EKG None  Radiology No results found.  Procedures Procedures (including critical care time)  Medications Ordered in UC Medications - No data to display  Initial Impression / Assessment and Plan / UC Course  I have reviewed the triage vital signs and the nursing notes.  Pertinent labs & imaging results that were available during my care of the patient were reviewed by me and considered in my medical decision making (see chart for details).    I discussed de Quervain's disease with the patient.  I told her that she has classic tenosynovitis of the extensor tendon of her thumb.  I told her that the movement she is feeling is just crepitus in the tendon with range of motion.  We discussed treatment is ice, rest, and anti-inflammatories.  The as needed ibuprofen is not giving her any pain relief.  Discussed  prednisone to get her started, then switching to ibuprofen 3 times a day with food until symptoms improve.  Discussed possible injection, referral to orthopedist if she fails to improve. Final Clinical Impressions(s) / UC Diagnoses   Final diagnoses:  De Quervain's disease (tenosynovitis)     Discharge Instructions     Take the prednisone medicine ( medrol) as directed Take all of day one today Do not take the ibuprofen while on the medrol Can re start the ibuprofen after the pak is done Continue the brace Ice for 20 min every couple of hours Follow up with ortho if fails to improve   ED Prescriptions    Medication Sig Dispense Auth. Provider   methylPREDNISolone (MEDROL DOSEPAK) 4 MG TBPK tablet tad 21 tablet Raylene Everts, MD     Controlled Substance Prescriptions [place] Controlled Substance Registry consulted? Not Applicable   Raylene Everts, MD 02/14/18 1950

## 2018-02-14 NOTE — Discharge Instructions (Signed)
Take the prednisone medicine ( medrol) as directed Take all of day one today Do not take the ibuprofen while on the medrol Can re start the ibuprofen after the pak is done Continue the brace Ice for 20 min every couple of hours Follow up with ortho if fails to improve

## 2018-02-16 LAB — HM DIABETES EYE EXAM

## 2018-03-02 ENCOUNTER — Other Ambulatory Visit: Payer: Self-pay

## 2018-03-02 MED ORDER — FLUTICASONE PROPIONATE 50 MCG/ACT NA SUSP: NASAL | 6 refills | Status: DC

## 2018-03-22 ENCOUNTER — Telehealth: Payer: Self-pay | Admitting: *Deleted

## 2018-03-22 NOTE — Telephone Encounter (Signed)
Called pt to inform, pt agreed to see pcp, scheduled for 10/8.

## 2018-03-22 NOTE — Telephone Encounter (Signed)
Pt wants to know if MD can Rx her Estradiol patches to help with her hotflashes.  Her friend was Rx'd these and it helps. Will forward to MD.  Mylah Baynes, Salome Spotted, [place]

## 2018-03-22 NOTE — Telephone Encounter (Signed)
I don't usually treat postmenopausal syndrome with estrogen replacement due to risk for endometrial cancer. Have her come in to discuss further. Thanks.

## 2018-04-05 ENCOUNTER — Encounter: Payer: Self-pay | Admitting: Family Medicine

## 2018-04-05 DIAGNOSIS — E119 Type 2 diabetes mellitus without complications: Secondary | ICD-10-CM | POA: Insufficient documentation

## 2018-04-06 ENCOUNTER — Other Ambulatory Visit: Payer: Self-pay

## 2018-04-06 ENCOUNTER — Ambulatory Visit (INDEPENDENT_AMBULATORY_CARE_PROVIDER_SITE_OTHER): Payer: Self-pay | Admitting: Family Medicine

## 2018-04-06 ENCOUNTER — Ambulatory Visit: Payer: Managed Care, Other (non HMO) | Admitting: Family Medicine

## 2018-04-06 ENCOUNTER — Encounter: Payer: Self-pay | Admitting: Family Medicine

## 2018-04-06 VITALS — BP 152/90 | HR 72 | Temp 98.4°F | Ht 66.0 in | Wt 244.0 lb

## 2018-04-06 DIAGNOSIS — R232 Flushing: Secondary | ICD-10-CM

## 2018-04-06 DIAGNOSIS — I10 Essential (primary) hypertension: Secondary | ICD-10-CM

## 2018-04-06 DIAGNOSIS — Z23 Encounter for immunization: Secondary | ICD-10-CM

## 2018-04-06 DIAGNOSIS — E119 Type 2 diabetes mellitus without complications: Secondary | ICD-10-CM

## 2018-04-06 DIAGNOSIS — J069 Acute upper respiratory infection, unspecified: Secondary | ICD-10-CM

## 2018-04-06 LAB — POCT GLYCOSYLATED HEMOGLOBIN (HGB A1C): HBA1C, POC (CONTROLLED DIABETIC RANGE): 6.1 % (ref 0.0–7.0)

## 2018-04-06 MED ORDER — CLONIDINE HCL 0.1 MG PO TABS: 0.1000 mg | ORAL_TABLET | Freq: Two times a day (BID) | ORAL | 1 refills | Status: DC

## 2018-04-06 NOTE — Progress Notes (Signed)
  Subjective:     Patient ID: Cynthia Wilson, female   DOB: 1966-03-20, 52 y.o.   MRN: [id number]  HPI DM2/HTN:Diet controlled DM. She is compliant with her BP meds. She took her BP meds this morning. Hot Flashes: Ongoing for more than 1 year. This occurs daily and it is worsening.Patient will like to discuss treatment. M. Obesity: She is still compliant with diet. However, she recently;y cut down on workout classes from 4 per week to 2 per week.  Congestion: She is just recovering from cold. No cough today but she feels congested in her head with occasional headache. No fever.  Current Outpatient Medications on File Prior to Visit  Medication Sig Dispense Refill  . fluticasone (FLONASE) 50 MCG/ACT nasal spray SPRAY 2 SPRAYS INTO EACH NOSTRIL EVERY DAY 16 g 6  . ibuprofen (ADVIL,MOTRIN) 600 MG tablet Take 1 tablet (600 mg total) by mouth every 8 (eight) hours as needed. 30 tablet 0  . lisinopril (PRINIVIL,ZESTRIL) 20 MG tablet TAKE 1 TABLET BY MOUTH EVERY DAY 90 tablet 1  . loratadine (CLARITIN) 10 MG tablet Take 10 mg by mouth daily.    . methylPREDNISolone (MEDROL DOSEPAK) 4 MG TBPK tablet tad 21 tablet 0  . Multiple Vitamin (MULTIVITAMIN) tablet Take 1 tablet by mouth daily. Womens Ultra Mega Vitamin     No current facility-administered medications on file prior to visit.    Past Medical History:  Diagnosis Date  . Allergy   . Arthritis   . GERD (gastroesophageal reflux disease)   . Heart murmur   . History of osteoarthritis    left knee  . Hypertension    under control with med., has been on med. x 5 yr.  . Immature cataract    small   . KNEE PAIN, LEFT, CHRONIC 07/30/2009   Seen by [facility] orth[facility] 03/31/12,DJD,  steroid injection   . Medial meniscus tear 09/2013   right knee  . Non-insulin dependent type 2 diabetes mellitus ([place])    off metformin- diet controlled per pt   . 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 Recurrent tonsillitis 12/01/2016  . Sinus headache  10/30/2015  . Wears partial dentures    upper   Vitals:   04/06/18 0834 04/06/18 0850  BP: 140/78 (!) 152/90  Pulse: 72   Temp: 98.4 F (36.9 C)   SpO2: 98%   Weight: 244 lb (110.7 kg)   Height: 5\' 6"  (1.676 m)      Review of Systems  Respiratory: Negative.   Cardiovascular: Negative.   Gastrointestinal: Negative.   Skin: Negative.   All other systems reviewed and are negative.      Objective:   Physical Exam  Constitutional: She is oriented to person, place, and time. She appears well-developed. No distress.  Cardiovascular: Normal rate, regular rhythm and normal heart sounds.  No murmur heard. Pulmonary/Chest: Effort normal and breath sounds normal. No stridor. No respiratory distress. She has no wheezes.  Abdominal: Soft. Bowel sounds are normal. She exhibits no distension and no mass. There is no tenderness.  Musculoskeletal: Normal range of motion. She exhibits no edema.  Neurological: She is alert and oriented to person, place, and time. No cranial nerve deficit. Coordination normal.  Nursing note and vitals reviewed.      Assessment:     DM2 HTN Hot Flashes Morbid obesity URI    Plan:     Check problem list.  URI: Gradually resolving. Monitor with conservative measures.

## 2018-04-06 NOTE — Assessment & Plan Note (Signed)
Diet control. A1C increased from 6.0 to 6.1 Continue diet and exercise plan.

## 2018-04-06 NOTE — Assessment & Plan Note (Signed)
BP not goal despite medication compliance. Add Clonidine which will also control her hot flashes. BP monitoring with parameters discussed. Call me if less than 100/60 or symptomatic. She agreed with the plan.

## 2018-04-06 NOTE — Patient Instructions (Signed)
Clonidine tablets What is this medicine? CLONIDINE (KLOE ni deen) is used to treat high blood pressure. This medicine may be used for other purposes; ask your health care provider or pharmacist if you have questions. COMMON BRAND NAME(S): Catapres What should I tell my health care provider before I take this medicine? They need to know if you have any of these conditions: -kidney disease -an unusual or allergic reaction to clonidine, other medicines, foods, dyes, or preservatives -pregnant or trying to get pregnant -breast-feeding How should I use this medicine? Take this medicine by mouth with a glass of water. Follow the directions on the prescription label. Take your doses at regular intervals. Do not take your medicine more often than directed. Do not suddenly stop taking this medicine. You must gradually reduce the dose or you may get a dangerous increase in blood pressure. Ask your doctor or health care professional for advice. Talk to your pediatrician regarding the use of this medicine in children. Special care may be needed. Overdosage: If you think you have taken too much of this medicine contact a poison control center or emergency room at once. NOTE: This medicine is only for you. Do not share this medicine with others. What if I miss a dose? If you miss a dose, take it as soon as you can. If it is almost time for your next dose, take only that dose. Do not take double or extra doses. What may interact with this medicine? Do not take this medicine with any of the following medications: -MAOIs like Carbex, Eldepryl, Marplan, Nardil, and Parnate This medicine may also interact with the following medications: -barbiturate medicines for inducing sleep or treating seizures like phenobarbital -certain medicines for blood pressure, heart disease, irregular heart beat -certain medicines for depression, anxiety, or psychotic disturbances -prescription pain medicines This list may not  describe all possible interactions. Give your health care provider a list of all the medicines, herbs, non-prescription drugs, or dietary supplements you use. Also tell them if you smoke, drink alcohol, or use illegal drugs. Some items may interact with your medicine. What should I watch for while using this medicine? Visit your doctor or health care professional for regular checks on your progress. Check your heart rate and blood pressure regularly while you are taking this medicine. Ask your doctor or health care professional what your heart rate should be and when you should contact him or her. You may get drowsy or dizzy. Do not drive, use machinery, or do anything that needs mental alertness until you know how this medicine affects you. To avoid dizzy or fainting spells, do not stand or sit up quickly, especially if you are an older person. Alcohol can make you more drowsy and dizzy. Avoid alcoholic drinks. Your mouth may get dry. Chewing sugarless gum or sucking hard candy, and drinking plenty of water will help. Do not treat yourself for coughs, colds, or pain while you are taking this medicine without asking your doctor or health care professional for advice. Some ingredients may increase your blood pressure. If you are going to have surgery tell your doctor or health care professional that you are taking this medicine. What side effects may I notice from receiving this medicine? Side effects that you should report to your doctor or health care professional as soon as possible: -allergic reactions like skin rash, itching or hives, swelling of the face, lips, or tongue -anxiety, nervousness -chest pain -depression -fast, irregular heartbeat -swelling of feet or legs -unusually   weak or tired Side effects that usually do not require medical attention (report to your doctor or health care professional if they continue or are bothersome): -change in sex drive or  performance -constipation -headache This list may not describe all possible side effects. Call your doctor for medical advice about side effects. You may report side effects to FDA at 1-800-FDA-1088. Where should I keep my medicine? Keep out of the reach of children. Store at room temperature between 15 and 30 degrees C (59 and 86 degrees F). Protect from light. Keep container tightly closed. Throw away any unused medicine after the expiration date. NOTE: This sheet is a summary. It may not cover all possible information. If you have questions about this medicine, talk to your doctor, pharmacist, or health care provider.  2018 Elsevier/Gold Standard (2010-12-11 13:01:28)

## 2018-04-06 NOTE — Assessment & Plan Note (Signed)
She wanted estrogen replacement. S/E discussed including ovarian and breast cancer. She will try Clonidine instead. Monitor closely for improvement.

## 2018-04-07 LAB — BASIC METABOLIC PANEL
BUN/Creatinine Ratio: 17 (ref 9–23)
BUN: 14 mg/dL (ref 6–24)
CHLORIDE: 101 mmol/L (ref 96–106)
CO2: 26 mmol/L (ref 20–29)
CREATININE: 0.84 mg/dL (ref 0.57–1.00)
Calcium: 9.5 mg/dL (ref 8.7–10.2)
GFR calc Af Amer: 91 mL/min/{1.73_m2} (ref >59)
GFR calc non Af Amer: 79 mL/min/{1.73_m2} (ref >59)
GLUCOSE: 107 mg/dL — AB (ref 65–99)
Potassium: 4 mmol/L (ref 3.5–5.2)
Sodium: 141 mmol/L (ref 134–144)

## 2018-04-07 LAB — LIPID PANEL
CHOL/HDL RATIO: 3.5 ratio (ref 0.0–4.4)
Cholesterol, Total: 163 mg/dL (ref 100–199)
HDL: 47 mg/dL (ref >39)
LDL Calculated: 93 mg/dL (ref 0–99)
Triglycerides: 113 mg/dL (ref 0–149)
VLDL Cholesterol Cal: 23 mg/dL (ref 5–40)

## 2018-04-27 ENCOUNTER — Telehealth: Payer: Self-pay | Admitting: Family Medicine

## 2018-04-27 NOTE — Telephone Encounter (Signed)
Spoke with pt reminding them about/confirming next day appt. -[facility]

## 2018-04-28 ENCOUNTER — Encounter: Payer: Self-pay | Admitting: Family Medicine

## 2018-04-28 ENCOUNTER — Ambulatory Visit (INDEPENDENT_AMBULATORY_CARE_PROVIDER_SITE_OTHER): Payer: No Typology Code available for payment source | Admitting: Family Medicine

## 2018-04-28 ENCOUNTER — Other Ambulatory Visit: Payer: Self-pay

## 2018-04-28 VITALS — BP 138/79 | HR 66 | Temp 98.3°F | Wt 241.0 lb

## 2018-04-28 DIAGNOSIS — M47816 Spondylosis without myelopathy or radiculopathy, lumbar region: Secondary | ICD-10-CM

## 2018-04-28 DIAGNOSIS — R232 Flushing: Secondary | ICD-10-CM | POA: Diagnosis not present

## 2018-04-28 DIAGNOSIS — E042 Nontoxic multinodular goiter: Secondary | ICD-10-CM | POA: Diagnosis not present

## 2018-04-28 DIAGNOSIS — I10 Essential (primary) hypertension: Secondary | ICD-10-CM

## 2018-04-28 NOTE — Patient Instructions (Signed)

## 2018-04-28 NOTE — Assessment & Plan Note (Signed)
PT referral recommended. She declined. Consider further imaging or ortho referral if worsening. Continue Ibuprofen as needed for pain.

## 2018-04-28 NOTE — Assessment & Plan Note (Signed)
BP looks good. Continue current regimen. 

## 2018-04-28 NOTE — Progress Notes (Signed)
  Subjective:     Patient ID: Cynthia Wilson, female   DOB: 12-15-1965, 52 y.o.   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 Here for f/u. Hot flashes:Clonidine made her eat more so she d/ced it. Still having symptoms. KGM:WNUUVOZDG with meds. Here for f/u. Back pain:Still having issues with her back. Worse at night when she is getting ready to sleep. Pain radiates to her left hip. Uses Ibuprofen or Tylenol as needed for pain with come improvement.  Current Outpatient Medications on File Prior to Visit  Medication Sig Dispense Refill  . cloNIDine (CATAPRES) 0.1 MG tablet Take 1 tablet (0.1 mg total) by mouth 2 (two) times daily. 90 tablet 1  . fluticasone (FLONASE) 50 MCG/ACT nasal spray SPRAY 2 SPRAYS INTO EACH NOSTRIL EVERY DAY 16 g 6  . ibuprofen (ADVIL,MOTRIN) 600 MG tablet Take 1 tablet (600 mg total) by mouth every 8 (eight) hours as needed. 30 tablet 0  . lisinopril (PRINIVIL,ZESTRIL) 20 MG tablet TAKE 1 TABLET BY MOUTH EVERY DAY 90 tablet 1  . loratadine (CLARITIN) 10 MG tablet Take 10 mg by mouth daily.    . methylPREDNISolone (MEDROL DOSEPAK) 4 MG TBPK tablet tad 21 tablet 0  . Multiple Vitamin (MULTIVITAMIN) tablet Take 1 tablet by mouth daily. Womens Ultra Mega Vitamin     No current facility-administered medications on file prior to visit.    Past Medical History:  Diagnosis Date  . Allergy   . Arthritis   . GERD (gastroesophageal reflux disease)   . Heart murmur   . History of osteoarthritis    left knee  . Hypertension    under control with med., has been on med. x 5 yr.  . Immature cataract    small   . KNEE PAIN, LEFT, CHRONIC 07/30/2009   Seen by [facility] ortho 03/31/12,DJD,  steroid injection   . Medial meniscus tear 09/2013   right knee  . Non-insulin dependent type 2 diabetes mellitus ([place])    off metformin- diet controlled per pt   . 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 Recurrent tonsillitis 12/01/2016  . Sinus headache 10/30/2015  .  Wears partial dentures    upper     Review of Systems  Respiratory: Negative.   Cardiovascular: Negative.   Gastrointestinal: Negative.   Musculoskeletal: Positive for back pain.  Neurological:       Hot flashes  All other systems reviewed and are negative.      Objective:   Physical Exam  Constitutional: She is oriented to person, place, and time. She appears well-developed. No distress.  Neck: No thyromegaly present.  Cardiovascular: Normal rate, regular rhythm, normal heart sounds and intact distal pulses.  No murmur heard. Pulmonary/Chest: Effort normal and breath sounds normal. No stridor. No respiratory distress. She has no wheezes.  Abdominal: Soft. Bowel sounds are normal. There is no tenderness.  Musculoskeletal: Normal range of motion. She exhibits no edema.       Lumbar back: Normal.  Neurological: She is alert and oriented to person, place, and time.  Nursing note and vitals reviewed.      Assessment:     Multinodular goitre Hot flashes HTN Back pain    Plan:     Check problem list,.  Note: Advised to have her ophthalmologist fast her most exam exam report to [place]. She agreed to do so. She declined pneumonia shot today.

## 2018-04-28 NOTE — Assessment & Plan Note (Signed)
Asymptomatic. S/P endocrinology eval. Recheck TSH today.

## 2018-04-28 NOTE — Assessment & Plan Note (Signed)
As discussed with her, It is uncommon to have increased appetite from Clonidine. She will try to get back on meds. Monitor BP closely at home while on meds,. F/U soon if no improvement in her symptoms.

## 2018-04-29 ENCOUNTER — Encounter: Payer: Self-pay | Admitting: Family Medicine

## 2018-04-29 LAB — TSH: TSH: 0.496 u[IU]/mL (ref 0.450–4.500)

## 2018-05-30 ENCOUNTER — Ambulatory Visit (HOSPITAL_COMMUNITY)
Admission: EM | Admit: 2018-05-30 | Discharge: 2018-05-30 | Disposition: A | Discharge status: Home / Self Care | Payer: No Typology Code available for payment source | Attending: Family Medicine | Admitting: Family Medicine

## 2018-05-30 ENCOUNTER — Encounter (HOSPITAL_COMMUNITY): Payer: Self-pay | Admitting: *Deleted

## 2018-05-30 DIAGNOSIS — S46811A Strain of other muscles, fascia and tendons at shoulder and upper arm level, right arm, initial encounter: Secondary | ICD-10-CM

## 2018-05-30 MED ORDER — CYCLOBENZAPRINE HCL 5 MG PO TABS: 5.0000 mg | ORAL_TABLET | Freq: Every day | ORAL | 0 refills | Status: DC

## 2018-05-30 MED ORDER — PREDNISONE 50 MG PO TABS: ORAL_TABLET | ORAL | 1 refills | Status: DC
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
## 2018-06-13 ENCOUNTER — Other Ambulatory Visit: Payer: Self-pay | Admitting: Family Medicine

## 2018-08-14 DIAGNOSIS — M1711 Unilateral primary osteoarthritis, right knee: Secondary | ICD-10-CM | POA: Diagnosis not present

## 2018-10-04 ENCOUNTER — Other Ambulatory Visit: Payer: Self-pay | Admitting: Family Medicine

## 2018-10-07 DIAGNOSIS — M1711 Unilateral primary osteoarthritis, right knee: Secondary | ICD-10-CM | POA: Diagnosis not present

## 2018-10-21 DIAGNOSIS — M654 Radial styloid tenosynovitis [de Quervain]: Secondary | ICD-10-CM | POA: Diagnosis not present

## 2018-10-29 ENCOUNTER — Telehealth (INDEPENDENT_AMBULATORY_CARE_PROVIDER_SITE_OTHER): Payer: No Typology Code available for payment source | Admitting: Family Medicine

## 2018-10-29 ENCOUNTER — Other Ambulatory Visit: Payer: Self-pay

## 2018-10-29 ENCOUNTER — Encounter: Payer: Self-pay | Admitting: Family Medicine

## 2018-10-29 DIAGNOSIS — E119 Type 2 diabetes mellitus without complications: Secondary | ICD-10-CM

## 2018-10-29 DIAGNOSIS — I10 Essential (primary) hypertension: Secondary | ICD-10-CM

## 2018-10-29 MED ORDER — DICLOFENAC SODIUM 1 % TD GEL: TRANSDERMAL | 0 refills | Status: AC

## 2018-10-29 NOTE — Assessment & Plan Note (Signed)
Off Clonidine. Continue Lisinopril. Monitor BP closely at home. F/U in 3 weeks for reassessment or sooner if symptoms worsen.

## 2018-10-29 NOTE — Progress Notes (Signed)
[facility] Telemedicine Visit  Patient consented to have virtual visit. Method of visit: Video  Encounter participants: Patient: Cynthia Wilson - located at Home Provider: Andrena Mews - located at Office Others (if applicable): NA  Chief Complaint: F/U  HPI:  DM2: Diet controlled. She lost some weight since her last visit.  HTN:Has not checked her BP in a while. She is compliant with her Lisinopril but not the Clonidine. Denies any other concern. Back pain: Pain improved but not completely. She has been using Tylenol nightly which makes her sleep. Pain is aggravated by activity. No neuro concern. Hot flashes:Clonidine is not helpful. She self d/ced it.  Vitals:   10/29/18 1004  Weight: 238 lb (108 kg)     ROS: per HPI  Pertinent PMHx: Problem list reviewed  Exam:  Respiratory: No distress. HEENT: EOMI. No Facial asymmetry. Neuro: Awake and alert, orientation x 3  Assessment/Plan:  Non-insulin dependent type 2 diabetes mellitus ([place]) Appointment made for A1C check. Continue diet control. I commended her on weight loss.  HYPERTENSION, BENIGN SYSTEMIC Off Clonidine. Continue Lisinopril. Monitor BP closely at home. F/U in 3 weeks for reassessment or sooner if symptoms worsen.  DJD (degenerative joint disease), lumbar Trial of Voltaren patch. Continue home exercise as tolerated.  Hot flashes Readdress at next visit.    Time spent during visit with patient: 21 minutes

## 2018-10-29 NOTE — Assessment & Plan Note (Signed)
Readdress at next visit.

## 2018-10-29 NOTE — Assessment & Plan Note (Signed)
Appointment made for A1C check. Continue diet control. I commended her on weight loss.

## 2018-10-29 NOTE — Assessment & Plan Note (Signed)
Trial of Voltaren patch. Continue home exercise as tolerated.

## 2018-10-29 NOTE — Progress Notes (Signed)
Patient is following up with Dr. Gwendlyn Deutscher today.  No new concerns but would like to discuss her hot flashes. Does have occasional back at night and taking ibuprofen as needed for that.  Nurse assessment done and no red flags.  Ghada Abbett,CMA

## 2018-11-16 ENCOUNTER — Encounter: Payer: Self-pay | Admitting: Family Medicine

## 2018-11-16 DIAGNOSIS — Z90710 Acquired absence of both cervix and uterus: Secondary | ICD-10-CM | POA: Insufficient documentation

## 2018-11-17 ENCOUNTER — Other Ambulatory Visit: Payer: Self-pay

## 2018-11-17 ENCOUNTER — Ambulatory Visit: Payer: No Typology Code available for payment source | Admitting: Family Medicine

## 2018-11-17 ENCOUNTER — Encounter: Payer: Self-pay | Admitting: Family Medicine

## 2018-11-17 VITALS — BP 130/82 | Ht 66.0 in | Wt 240.4 lb

## 2018-11-17 DIAGNOSIS — E119 Type 2 diabetes mellitus without complications: Secondary | ICD-10-CM | POA: Diagnosis not present

## 2018-11-17 DIAGNOSIS — R232 Flushing: Secondary | ICD-10-CM | POA: Diagnosis not present

## 2018-11-17 DIAGNOSIS — I10 Essential (primary) hypertension: Secondary | ICD-10-CM | POA: Diagnosis not present

## 2018-11-17 LAB — POCT GLYCOSYLATED HEMOGLOBIN (HGB A1C): HbA1c, POC (controlled diabetic range): 6.4 % (ref 0.0–7.0)

## 2018-11-17 NOTE — Assessment & Plan Note (Signed)
A1C increased to 6.4, still below goal. Continue diet and exercise. Recheck A1C in 4 months. She will obtain her eye exam report from her ophthalmologist. She declined pneumovax when offered.

## 2018-11-17 NOTE — Assessment & Plan Note (Signed)
BO looks good on current regimen. D/C clonidine.

## 2018-11-17 NOTE — Assessment & Plan Note (Signed)
D/C clonidine. Prempro discussed as well as s/e. She prefers to try Extroven again instead. F/U as needed.

## 2018-11-17 NOTE — Assessment & Plan Note (Signed)
Gained few pounds since last visit. Will continue exercise and modify her daily calories. Reassess at her next appointment.

## 2018-11-17 NOTE — Patient Instructions (Signed)
Menopause  Menopause is the normal time of life when menstrual periods stop completely. It is usually confirmed by 12 months without a menstrual period. The transition to menopause (perimenopause) most often happens between the ages of 45 and 55. During perimenopause, hormone levels change in your body, which can cause symptoms and affect your health. Menopause may increase your risk for:   Loss of bone (osteoporosis), which causes bone breaks (fractures).   Depression.   Hardening and narrowing of the arteries (atherosclerosis), which can cause heart attacks and strokes.  What are the causes?  This condition is usually caused by a natural change in hormone levels that happens as you get older. The condition may also be caused by surgery to remove both ovaries (bilateral oophorectomy).  What increases the risk?  This condition is more likely to start at an earlier age if you have certain medical conditions or treatments, including:   A tumor of the pituitary gland in the brain.   A disease that affects the ovaries and hormone production.   Radiation treatment for cancer.   Certain cancer treatments, such as chemotherapy or hormone (anti-estrogen) therapy.   Heavy smoking and excessive alcohol use.   Family history of early menopause.  This condition is also more likely to develop earlier in women who are very thin.  What are the signs or symptoms?  Symptoms of this condition include:   Hot flashes.   Irregular menstrual periods.   Night sweats.   Changes in feelings about sex. This could be a decrease in sex drive or an increased comfort around your sexuality.   Vaginal dryness and thinning of the vaginal walls. This may cause painful intercourse.   Dryness of the skin and development of wrinkles.   Headaches.   Problems sleeping (insomnia).   Mood swings or irritability.   Memory problems.   Weight gain.   Hair growth on the face and chest.   Bladder infections or problems with urinating.  How  is this diagnosed?  This condition is diagnosed based on your medical history, a physical exam, your age, your menstrual history, and your symptoms. Hormone tests may also be done.  How is this treated?  In some cases, no treatment is needed. You and your health care provider should make a decision together about whether treatment is necessary. Treatment will be based on your individual condition and preferences. Treatment for this condition focuses on managing symptoms. Treatment may include:   Menopausal hormone therapy (MHT).   Medicines to treat specific symptoms or complications.   Acupuncture.   Vitamin or herbal supplements.  Before starting treatment, make sure to let your health care provider know if you have a personal or family history of:   Heart disease.   Breast cancer.   Blood clots.   Diabetes.   Osteoporosis.  Follow these instructions at home:  Lifestyle   Do not use any products that contain nicotine or tobacco, such as cigarettes and e-cigarettes. If you need help quitting, ask your health care provider.   Get at least 30 minutes of physical activity on 5 or more days each week.   Avoid alcoholic and caffeinated beverages, as well as spicy foods. This may help prevent hot flashes.   Get 7-8 hours of sleep each night.   If you have hot flashes, try:  ? Dressing in layers.  ? Avoiding things that may trigger hot flashes, such as spicy food, warm places, or stress.  ? Taking slow, deep   breaths when a hot flash starts.  ? Keeping a fan in your home and office.   Find ways to manage stress, such as deep breathing, meditation, or journaling.   Consider going to group therapy with other women who are having menopause symptoms. Ask your health care provider about recommended group therapy meetings.  Eating and drinking   Eat a healthy, balanced diet that contains whole grains, lean protein, low-fat dairy, and plenty of fruits and vegetables.   Your health care provider may recommend  adding more soy to your diet. Foods that contain soy include tofu, tempeh, and soy milk.   Eat plenty of foods that contain calcium and vitamin D for bone health. Items that are rich in calcium include low-fat milk, yogurt, beans, almonds, sardines, broccoli, and kale.  Medicines   Take over-the-counter and prescription medicines only as told by your health care provider.   Talk with your health care provider before starting any herbal supplements. If prescribed, take vitamins and supplements as told by your health care provider. These may include:  ? Calcium. Women age 51 and older should get 1,200 mg (milligrams) of calcium every day.  ? Vitamin D. Women need 600-800 International Units of vitamin D each day.  ? Vitamins B12 and B6. Aim for 50 micrograms of B12 and 1.5 mg of B6 each day.  General instructions   Keep track of your menstrual periods, including:  ? When they occur.  ? How heavy they are and how long they last.  ? How much time passes between periods.   Keep track of your symptoms, noting when they start, how often you have them, and how long they last.   Use vaginal lubricants or moisturizers to help with vaginal dryness and improve comfort during sex.   Keep all follow-up visits as told by your health care provider. This is important. This includes any group therapy or counseling.  Contact a health care provider if:   You are still having menstrual periods after age 55.   You have pain during sex.   You have not had a period for 12 months and you develop vaginal bleeding.  Get help right away if:   You have:  ? Severe depression.  ? Excessive vaginal bleeding.  ? Pain when you urinate.  ? A fast or irregular heart beat (palpitations).  ? Severe headaches.  ? Abdomen (abdominal) pain or severe indigestion.   You fell and you think you have a broken bone.   You develop leg or chest pain.   You develop vision problems.   You feel a lump in your breast.  Summary   Menopause is the normal  time of life when menstrual periods stop completely. It is usually confirmed by 12 months without a menstrual period.   The transition to menopause (perimenopause) most often happens between the ages of 45 and 55.   Symptoms can be managed through medicines, lifestyle changes, and complementary therapies such as acupuncture.   Eat a balanced diet that is rich in nutrients to promote bone health and heart health and to manage symptoms during menopause.  This information is not intended to replace advice given to you by your health care provider. Make sure you discuss any questions you have with your health care provider.  Document Released: 09/06/2003 Document Revised: 07/19/2016 Document Reviewed: 07/19/2016  Elsevier Interactive Patient Education  2019 Elsevier Inc.

## 2018-11-17 NOTE — Progress Notes (Signed)
Subjective:     Patient ID: Cynthia Wilson, female   DOB: Jan 16, 1966, 53 y.o.   MRN: [id number]  HPI DM: Here for f/u. Currently on diet and exercise. She does not check her CBG at home. Her last exam ophthalmologic exam a year ago. HTN: She is currently on Lisinopril 20 mg qd. Self d/c Clonidine. Obesity: She does virtual exercise twice a day and still working on her calorie count. Hot flashes: Feels Clonidine is not working, hence she self d/c it. She still has very bad symptoms.  Current Outpatient Medications on File Prior to Visit  Medication Sig Dispense Refill  . cloNIDine (CATAPRES) 0.1 MG tablet Take 1 tablet (0.1 mg total) by mouth 2 (two) times daily. (Patient not taking: Reported on 10/29/2018) 90 tablet 1  . cyclobenzaprine (FLEXERIL) 5 MG tablet Take 1 tablet (5 mg total) by mouth at bedtime. (Patient not taking: Reported on 10/29/2018) 7 tablet 0  . diclofenac sodium (VOLTAREN) 1 % GEL Apply 4 g to your back four times daily as needed for pain 100 g 0  . fluticasone (FLONASE) 50 MCG/ACT nasal spray SPRAY 2 SPRAYS INTO EACH NOSTRIL EVERY DAY 16 g 6  . ibuprofen (ADVIL,MOTRIN) 600 MG tablet Take 1 tablet (600 mg total) by mouth every 8 (eight) hours as needed. 30 tablet 0  . lisinopril (PRINIVIL,ZESTRIL) 20 MG tablet TAKE 1 TABLET BY MOUTH EVERY DAY (Patient not taking: Reported on 10/29/2018) 90 tablet 1  . loratadine (CLARITIN) 10 MG tablet Take 10 mg by mouth daily.    . Multiple Vitamin (MULTIVITAMIN) tablet Take 1 tablet by mouth daily. Womens Ultra Mega Vitamin    . predniSONE (DELTASONE) 50 MG tablet One daily with food (Patient not taking: Reported on 10/29/2018) 3 tablet 1   No current facility-administered medications on file prior to visit.    Past Medical History:  Diagnosis Date  . Allergy   . Arthritis   . GERD (gastroesophageal reflux disease)   . Heart murmur   . History of osteoarthritis    left knee  . Hypertension    under control with med., has been on med.  x 5 yr.  . Immature cataract    small   . KNEE PAIN, LEFT, CHRONIC 07/30/2009   Seen by [facility] 03/31/12,DJD,  steroid injection   . Medial meniscus tear 09/2013   right knee  . Non-insulin dependent type 2 diabetes mellitus ([place])    off metformin- diet controlled per pt   . 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 Recurrent tonsillitis 12/01/2016  . Sinus headache 10/30/2015  . Wears partial dentures    upper     Review of Systems  Respiratory: Negative.   Cardiovascular: Negative.   Gastrointestinal: Negative.   Genitourinary: Negative.   Musculoskeletal: Negative.   Skin:       Hot flashes  All other systems reviewed and are negative.      Objective:   Physical Exam Vitals signs reviewed.  Constitutional:      Appearance: She is obese.  Neck:     Musculoskeletal: Normal range of motion.  Cardiovascular:     Rate and Rhythm: Normal rate and regular rhythm.     Pulses: Normal pulses.     Heart sounds: Normal heart sounds. No murmur.  Pulmonary:     Effort: Pulmonary effort is normal. No respiratory distress.     Breath sounds: Normal breath sounds. No stridor. No wheezing or rhonchi.  Abdominal:  General: Abdomen is flat. Bowel sounds are normal. There is no distension.     Palpations: Abdomen is soft. There is no mass.     Tenderness: There is no abdominal tenderness.  Musculoskeletal: Normal range of motion.        General: No tenderness or deformity.     Right lower leg: No edema.     Left lower leg: No edema.     Comments: Sensory exam of the foot is normal, tested with the monofilament. Good pulses, no lesions or ulcers, good peripheral pulses.   Neurological:     General: No focal deficit present.     Mental Status: She is alert and oriented to person, place, and time.        Assessment:     DM2 HTN Obesity (Morbid) Hot flashes    Plan:     Check problem list.  Pneumovax offered today but she declined.

## 2018-12-16 ENCOUNTER — Other Ambulatory Visit: Payer: Self-pay | Admitting: Family Medicine

## 2018-12-16 DIAGNOSIS — Z1231 Encounter for screening mammogram for malignant neoplasm of breast: Secondary | ICD-10-CM

## 2018-12-27 ENCOUNTER — Encounter: Payer: Self-pay | Admitting: Family Medicine

## 2019-01-28 ENCOUNTER — Other Ambulatory Visit: Payer: Self-pay

## 2019-01-28 ENCOUNTER — Ambulatory Visit
Admission: RE | Admit: 2019-01-28 | Discharge: 2019-01-28 | Disposition: A | Discharge status: Home / Self Care | Payer: 59 | Source: Ambulatory Visit | Attending: Family Medicine | Admitting: Family Medicine

## 2019-01-28 DIAGNOSIS — Z1231 Encounter for screening mammogram for malignant neoplasm of breast: Secondary | ICD-10-CM

## 2019-04-26 ENCOUNTER — Other Ambulatory Visit: Payer: Self-pay

## 2019-04-26 ENCOUNTER — Ambulatory Visit (INDEPENDENT_AMBULATORY_CARE_PROVIDER_SITE_OTHER): Payer: 59 | Admitting: Family Medicine

## 2019-04-26 ENCOUNTER — Encounter: Payer: Self-pay | Admitting: Family Medicine

## 2019-04-26 VITALS — BP 135/80 | HR 71 | Ht 66.0 in | Wt 240.8 lb

## 2019-04-26 DIAGNOSIS — R232 Flushing: Secondary | ICD-10-CM

## 2019-04-26 DIAGNOSIS — R7303 Prediabetes: Secondary | ICD-10-CM | POA: Diagnosis not present

## 2019-04-26 DIAGNOSIS — R7309 Other abnormal glucose: Secondary | ICD-10-CM | POA: Diagnosis not present

## 2019-04-26 DIAGNOSIS — I10 Essential (primary) hypertension: Secondary | ICD-10-CM | POA: Diagnosis not present

## 2019-04-26 DIAGNOSIS — Z23 Encounter for immunization: Secondary | ICD-10-CM | POA: Diagnosis not present

## 2019-04-26 LAB — POCT GLYCOSYLATED HEMOGLOBIN (HGB A1C): HbA1c, POC (controlled diabetic range): 5.9 % (ref 0.0–7.0)

## 2019-04-26 NOTE — Assessment & Plan Note (Addendum)
Non- compliant with med. Her BP actually looks good. I will cut her Lisinopril to 5 mg to give some renal protection. Monitor BP closely at home. F/U soon if there is any concern.

## 2019-04-26 NOTE — Patient Instructions (Signed)
Lisinopril tablets What is this medicine? LISINOPRIL (lyse IN oh pril) is an ACE inhibitor. This medicine is used to treat high blood pressure and heart failure. It is also used to protect the heart immediately after a heart attack. This medicine may be used for other purposes; ask your health care provider or pharmacist if you have questions. COMMON BRAND NAME(S): Prinivil, Zestril What should I tell my health care provider before I take this medicine? They need to know if you have any of these conditions:  diabetes  heart or blood vessel disease  kidney disease  low blood pressure  previous swelling of the tongue, face, or lips with difficulty breathing, difficulty swallowing, hoarseness, or tightening of the throat  an unusual or allergic reaction to lisinopril, other ACE inhibitors, insect venom, foods, dyes, or preservatives  pregnant or trying to get pregnant  breast-feeding How should I use this medicine? Take this medicine by mouth with a glass of water. Follow the directions on your prescription label. You may take this medicine with or without food. If it upsets your stomach, take it with food. Take your medicine at regular intervals. Do not take it more often than directed. Do not stop taking except on your doctor's advice. Talk to your pediatrician regarding the use of this medicine in children. Special care may be needed. While this drug may be prescribed for children as young as 28 years of age for selected conditions, precautions do apply. Overdosage: If you think you have taken too much of this medicine contact a poison control center or emergency room at once. NOTE: This medicine is only for you. Do not share this medicine with others. What if I miss a dose? If you miss a dose, take it as soon as you can. If it is almost time for your next dose, take only that dose. Do not take double or extra doses. What may interact with this medicine? Do not take this medicine with  any of the following medications:  hymenoptera venom  sacubitril; valsartan This medicines may also interact with the following medications:  aliskiren  angiotensin receptor blockers, like losartan or valsartan  certain medicines for diabetes  diuretics  everolimus  gold compounds  lithium  NSAIDs, medicines for pain and inflammation, like ibuprofen or naproxen  potassium salts or supplements  salt substitutes  sirolimus  temsirolimus This list may not describe all possible interactions. Give your health care provider a list of all the medicines, herbs, non-prescription drugs, or dietary supplements you use. Also tell them if you smoke, drink alcohol, or use illegal drugs. Some items may interact with your medicine. What should I watch for while using this medicine? Visit your doctor or health care professional for regular check ups. Check your blood pressure as directed. Ask your doctor what your blood pressure should be, and when you should contact him or her. Do not treat yourself for coughs, colds, or pain while you are using this medicine without asking your doctor or health care professional for advice. Some ingredients may increase your blood pressure. Women should inform their doctor if they wish to become pregnant or think they might be pregnant. There is a potential for serious side effects to an unborn child. Talk to your health care professional or pharmacist for more information. Check with your doctor or health care professional if you get an attack of severe diarrhea, nausea and vomiting, or if you sweat a lot. The loss of too much body fluid can make  it dangerous for you to take this medicine. You may get drowsy or dizzy. Do not drive, use machinery, or do anything that needs mental alertness until you know how this drug affects you. Do not stand or sit up quickly, especially if you are an older patient. This reduces the risk of dizzy or fainting spells. Alcohol  can make you more drowsy and dizzy. Avoid alcoholic drinks. Avoid salt substitutes unless you are told otherwise by your doctor or health care professional. What side effects may I notice from receiving this medicine? Side effects that you should report to your doctor or health care professional as soon as possible:  allergic reactions like skin rash, itching or hives, swelling of the hands, feet, face, lips, throat, or tongue  breathing problems  signs and symptoms of kidney injury like trouble passing urine or change in the amount of urine  signs and symptoms of increased potassium like muscle weakness; chest pain; or fast, irregular heartbeat  signs and symptoms of liver injury like dark yellow or brown urine; general ill feeling or flu-like symptoms; light-colored stools; loss of appetite; nausea; right upper belly pain; unusually weak or tired; yellowing of the eyes or skin  signs and symptoms of low blood pressure like dizziness; feeling faint or lightheaded, falls; unusually weak or tired  stomach pain with or without nausea and vomiting Side effects that usually do not require medical attention (report to your doctor or health care professional if they continue or are bothersome):  changes in taste  cough  dizziness  fever  headache  sensitivity to light This list may not describe all possible side effects. Call your doctor for medical advice about side effects. You may report side effects to FDA at 1-800-FDA-1088. Where should I keep my medicine? Keep out of the reach of children. Store at room temperature between 15 and 30 degrees C (59 and 86 degrees F). Protect from moisture. Keep container tightly closed. Throw away any unused medicine after the expiration date. NOTE: This sheet is a summary. It may not cover all possible information. If you have questions about this medicine, talk to your doctor, pharmacist, or health care provider.  2020 Elsevier/Gold Standard  (2015-08-06 12:52:35)

## 2019-04-26 NOTE — Assessment & Plan Note (Addendum)
Weight still about the same. Continue diet and exercise plan. Monitor closely. Bmet and FLP checked.

## 2019-04-26 NOTE — Assessment & Plan Note (Signed)
Improved on Ginger pills.

## 2019-04-26 NOTE — Assessment & Plan Note (Addendum)
A1C remains in the prediabetic range off medicine. It has actually improved a lot. She is compliant with diet and exercise. Monitor closely. Obtain ophthalmology report.

## 2019-04-26 NOTE — Progress Notes (Addendum)
Subjective:     Patient ID: Cynthia Wilson, female   DOB: [birth year], 53 y.o.   MRN: [id number]  HPI DM2: Denies any concern. She is here for f/u. She had eye exam done 1 month ago. HTN: She has not taken Lisinopril in a long while. Hot flashes: Improved after starting ginger pills Weight: working out with her Musician. Diet good, following a meal prep.  Current Outpatient Medications on File Prior to Visit  Medication Sig Dispense Refill  . cyclobenzaprine (FLEXERIL) 5 MG tablet Take 1 tablet (5 mg total) by mouth at bedtime. (Patient not taking: Reported on 10/29/2018) 7 tablet 0  . diclofenac sodium (VOLTAREN) 1 % GEL Apply 4 g to your back four times daily as needed for pain 100 g 0  . fluticasone (FLONASE) 50 MCG/ACT nasal spray SPRAY 2 SPRAYS INTO EACH NOSTRIL EVERY DAY 16 g 6  . ibuprofen (ADVIL,MOTRIN) 600 MG tablet Take 1 tablet (600 mg total) by mouth every 8 (eight) hours as needed. (Patient not taking: Reported on 11/17/2018) 30 tablet 0  . lisinopril (PRINIVIL,ZESTRIL) 20 MG tablet TAKE 1 TABLET BY MOUTH EVERY DAY 90 tablet 1  . loratadine (CLARITIN) 10 MG tablet Take 10 mg by mouth daily.    . Multiple Vitamin (MULTIVITAMIN) tablet Take 1 tablet by mouth daily. Womens Ultra Mega Vitamin     No current facility-administered medications on file prior to visit.    Past Medical History:  Diagnosis Date  . Allergy   . Arthritis   . GERD (gastroesophageal reflux disease)   . Heart murmur   . History of osteoarthritis    left knee  . Hypertension    under control with med., has been on med. x 5 yr.  . Immature cataract    small   . KNEE PAIN, LEFT, CHRONIC 07/30/2009   Seen by [facility] orth[facility] 03/31/12,DJD,  steroid injection   . Medial meniscus tear 09/2013   right knee  . Non-insulin dependent type 2 diabetes mellitus ([place])    off metformin- diet controlled per pt   . 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 Recurrent tonsillitis 12/01/2016  . Sinus headache  10/30/2015  . Wears partial dentures    upper   Vitals:   04/26/19 0834 04/26/19 0852  BP: 126/72 135/80  Pulse: 71   SpO2: 98%   Weight: 240 lb 12.8 oz (109.2 kg)   Height: 5\' 6"  (1.676 m)      Review of Systems  Respiratory: Negative.   Cardiovascular: Negative.   Gastrointestinal: Negative.   Neurological: Negative.   All other systems reviewed and are negative.      Objective:   Physical Exam Vitals signs and nursing note reviewed.  Cardiovascular:     Rate and Rhythm: Normal rate and regular rhythm.     Heart sounds: Normal heart sounds. No murmur.  Pulmonary:     Effort: Pulmonary effort is normal. No respiratory distress.     Breath sounds: Normal breath sounds. No stridor. No wheezing or rhonchi.  Abdominal:     General: Bowel sounds are normal.     Palpations: Abdomen is soft. There is no mass.     Tenderness: There is no abdominal tenderness.  Musculoskeletal:     Right lower leg: No edema.     Left lower leg: No edema.  Neurological:     General: No focal deficit present.     Mental Status: She is alert.  Assessment:     DM2 HTN Hot flases Morbid obesity    Plan:     Check problem list.Flu shot given today.

## 2019-04-27 ENCOUNTER — Telehealth: Payer: Self-pay | Admitting: Family Medicine

## 2019-04-27 LAB — LIPID PANEL
Chol/HDL Ratio: 3.1 ratio (ref 0.0–4.4)
Cholesterol, Total: 165 mg/dL (ref 100–199)
HDL: 53 mg/dL (ref >39)
LDL Chol Calc (NIH): 100 mg/dL — ABNORMAL HIGH (ref 0–99)
Triglycerides: 61 mg/dL (ref 0–149)
VLDL Cholesterol Cal: 12 mg/dL (ref 5–40)

## 2019-04-27 LAB — BASIC METABOLIC PANEL
BUN/Creatinine Ratio: 19 (ref 9–23)
BUN: 16 mg/dL (ref 6–24)
CO2: 25 mmol/L (ref 20–29)
Calcium: 9 mg/dL (ref 8.7–10.2)
Chloride: 105 mmol/L (ref 96–106)
Creatinine, Ser: 0.86 mg/dL (ref 0.57–1.00)
GFR calc Af Amer: 88 mL/min/{1.73_m2} (ref >59)
GFR calc non Af Amer: 76 mL/min/{1.73_m2} (ref >59)
Glucose: 98 mg/dL (ref 65–99)
Potassium: 4.1 mmol/L (ref 3.5–5.2)
Sodium: 141 mmol/L (ref 134–144)

## 2019-04-27 MED ORDER — LISINOPRIL 5 MG PO TABS: 5.0000 mg | ORAL_TABLET | Freq: Every day | ORAL | 1 refills | Status: DC

## 2019-04-27 NOTE — Telephone Encounter (Signed)
I discussed cholesterol results with the patient.  She has a borderline risk of 5.1% for 10-years ASCVD Risk. I discussed moderate intensity statin vs lifestyle modification. She prefers lifestyle modification and repeat lab in 6-12 months.  All questions were answered.

## 2019-05-02 ENCOUNTER — Other Ambulatory Visit: Payer: Self-pay

## 2019-05-02 DIAGNOSIS — Z20822 Contact with and (suspected) exposure to covid-19: Secondary | ICD-10-CM

## 2019-05-04 LAB — NOVEL CORONAVIRUS, NAA: SARS-CoV-2, NAA: NOT DETECTED

## 2019-05-05 ENCOUNTER — Telehealth: Payer: Self-pay | Admitting: General Practice

## 2019-05-05 NOTE — Telephone Encounter (Signed)
Negative COVID results given. Patient results "NOT Detected." Caller expressed understanding. ° °

## 2019-06-27 ENCOUNTER — Other Ambulatory Visit: Payer: Self-pay | Admitting: Family Medicine

## 2019-07-01 DIAGNOSIS — H269 Unspecified cataract: Secondary | ICD-10-CM

## 2019-07-01 HISTORY — DX: Unspecified cataract: H26.9

## 2019-07-15 ENCOUNTER — Ambulatory Visit: Payer: 59 | Attending: Internal Medicine

## 2019-07-15 DIAGNOSIS — Z20822 Contact with and (suspected) exposure to covid-19: Secondary | ICD-10-CM

## 2019-07-16 LAB — NOVEL CORONAVIRUS, NAA: SARS-CoV-2, NAA: NOT DETECTED

## 2019-07-26 ENCOUNTER — Ambulatory Visit: Payer: 59 | Attending: Internal Medicine

## 2019-07-26 DIAGNOSIS — Z20822 Contact with and (suspected) exposure to covid-19: Secondary | ICD-10-CM

## 2019-07-27 LAB — NOVEL CORONAVIRUS, NAA: SARS-CoV-2, NAA: NOT DETECTED

## 2019-07-28 ENCOUNTER — Encounter: Payer: Self-pay | Admitting: Family Medicine

## 2019-08-02 ENCOUNTER — Encounter: Payer: Self-pay | Admitting: Family Medicine

## 2019-08-03 ENCOUNTER — Encounter: Payer: Self-pay | Admitting: Family Medicine

## 2019-08-03 ENCOUNTER — Ambulatory Visit (INDEPENDENT_AMBULATORY_CARE_PROVIDER_SITE_OTHER): Payer: BC Managed Care – PPO | Admitting: Family Medicine

## 2019-08-03 ENCOUNTER — Other Ambulatory Visit: Payer: Self-pay

## 2019-08-03 VITALS — BP 142/78 | HR 81 | Wt 247.6 lb

## 2019-08-03 DIAGNOSIS — R0982 Postnasal drip: Secondary | ICD-10-CM | POA: Diagnosis not present

## 2019-08-03 MED ORDER — FLUTICASONE PROPIONATE 50 MCG/ACT NA SUSP: 2.0000 | Freq: Every day | NASAL | 6 refills | Status: DC

## 2019-08-03 MED ORDER — AZELASTINE HCL 0.1 % NA SOLN: 2.0000 | Freq: Two times a day (BID) | NASAL | 12 refills | Status: DC

## 2019-08-03 NOTE — Patient Instructions (Signed)
Postnasal Drip Postnasal drip is the feeling of mucus going down the back of your throat. Mucus is a slimy substance that moistens and cleans your nose and throat, as well as the air pockets in face bones near your forehead and cheeks (sinuses). Small amounts of mucus pass from your nose and sinuses down the back of your throat all the time. This is normal. When you produce too much mucus or the mucus gets too thick, you can feel it. Some common causes of postnasal drip include:  Having more mucus because of: ? A cold or the flu. ? Allergies. ? Cold air. ? Certain medicines.  Having more mucus that is thicker because of: ? A sinus or nasal infection. ? Dry air. ? A food allergy. Follow these instructions at home: Relieving discomfort   Gargle with a salt-water mixture 3-4 times a day or as needed. To make a salt-water mixture, completely dissolve -1 tsp of salt in 1 cup of warm water.  If the air in your home is dry, use a humidifier to add moisture to the air.  Use a saline spray or container (neti pot) to flush out the nose (nasal irrigation). These methods can help clear away mucus and keep the nasal passages moist. General instructions  Take over-the-counter and prescription medicines only as told by your health care provider.  Follow instructions from your health care provider about eating or drinking restrictions. You may need to avoid caffeine.  Avoid things that you know you are allergic to (allergens), like dust, mold, pollen, pets, or certain foods.  Drink enough fluid to keep your urine pale yellow.  Keep all follow-up visits as told by your health care provider. This is important. Contact a health care provider if:  You have a fever.  You have a sore throat.  You have difficulty swallowing.  You have headache.  You have sinus pain.  You have a cough that does not go away.  The mucus from your nose becomes thick and is green or yellow in color.  You have  cold or flu symptoms that last more than 10 days. Summary  Postnasal drip is the feeling of mucus going down the back of your throat.  If your health care provider approves, use nasal irrigation or a nasal spray 2?4 times a day.  Avoid things that you know you are allergic to (allergens), like dust, mold, pollen, pets, or certain foods. This information is not intended to replace advice given to you by your health care provider. Make sure you discuss any questions you have with your health care provider. Document Revised: 10/08/2018 Document Reviewed: 09/29/2016 Elsevier Patient Education  2020 Elsevier Inc.  

## 2019-08-03 NOTE — Assessment & Plan Note (Signed)
Patient presenting with postnasal drip, possibly in the setting of unprotected Covid infection.  I speculate that the day that she initially tested negative her husband was positive, that may have been a false negative.  Perhaps the second test was after viral loads had decreased.  She is outside the positive window.  Regardless symptoms are consistent with postnasal drip after URI.  Patient already on fluticasone 1 nare spray per day, recommend increasing this to 2 nares.  Also plan to add Astelin given history of allergic rhinitis.  Patient follow-up if no improvement.

## 2019-08-03 NOTE — Progress Notes (Signed)
   CHIEF COMPLAINT / HPI:  Sinusitis: Patient presents with sinusitis. The patient reports chronic sinus symptoms since January 15.  At that time her husband was tested positive for COVID-19, she tested negative.  Patient was retested on 01/26 for which she was also negative.  Her symptoms include nasal congestion, sore throats, headaches.  There has not been a history of cough, fevers, foul breath, facial pain, itchy eyes, itchy nose, ear pain or ear fullness. There does not have been a history of chronic otitis media or pharyngotonsillitis.  Patient does have history of allergic rhinitis.  Medications tried have included Flonase 1 squirt in each nare daily.  She does not have had allergy testing which was not done.  PERTINENT  PMH / PSH:  Allergic rhinitis  OBJECTIVE: BP (!) 142/78   Pulse 81   Wt 247 lb 9.6 oz (112.3 kg)   SpO2 97%   BMI 39.96 kg/m   Physical Exam Constitutional:      Appearance: Normal appearance.  HENT:     Head: Normocephalic and atraumatic.     Right Ear: Tympanic membrane normal.     Left Ear: Tympanic membrane normal.     Nose: No congestion or rhinorrhea.     Right Turbinates: Not enlarged, swollen or pale.     Left Turbinates: Not enlarged, swollen or pale.     Right Sinus: No maxillary sinus tenderness or frontal sinus tenderness.     Left Sinus: No maxillary sinus tenderness or frontal sinus tenderness.     Mouth/Throat:     Mouth: Mucous membranes are dry.  Eyes:     Extraocular Movements: Extraocular movements intact.     Pupils: Pupils are equal, round, and reactive to light.  Cardiovascular:     Rate and Rhythm: Normal rate.  Pulmonary:     Breath sounds: Normal breath sounds.  Musculoskeletal:     Cervical back: Normal range of motion and neck supple.  Lymphadenopathy:     Cervical: No cervical adenopathy.  Neurological:     Mental Status: She is alert.      ASSESSMENT / PLAN:  Postnasal drip Patient presenting with postnasal drip,  possibly in the setting of unprotected Covid infection.  I speculate that the day that she initially tested negative her husband was positive, that may have been a false negative.  Perhaps the second test was after viral loads had decreased.  She is outside the positive window.  Regardless symptoms are consistent with postnasal drip after URI.  Patient already on fluticasone 1 nare spray per day, recommend increasing this to 2 nares.  Also plan to add Astelin given history of allergic rhinitis.  Patient follow-up if no improvement.     Bonnita Hollow, MD [place]

## 2019-08-08 ENCOUNTER — Other Ambulatory Visit: Payer: Self-pay | Admitting: Family Medicine

## 2019-08-08 ENCOUNTER — Telehealth: Payer: Self-pay

## 2019-08-08 MED ORDER — DOXYCYCLINE HYCLATE 100 MG PO TABS: 100.0000 mg | ORAL_TABLET | Freq: Two times a day (BID) | ORAL | 0 refills | Status: AC

## 2019-08-08 NOTE — Telephone Encounter (Signed)
Patient calls nurse line regarding receiving abx for sinus infection. Patient was seen by Dr. Grandville Silos on 08/03/19 for symptoms. Patient states that symptoms are still persistent.   To PCP and Dr. Grandville Silos  Please advise  Talbot Grumbling, RN

## 2019-08-08 NOTE — Telephone Encounter (Signed)
I have escribed A/B to her pharma.

## 2019-08-10 ENCOUNTER — Ambulatory Visit: Payer: BC Managed Care – PPO | Attending: Internal Medicine

## 2019-08-10 DIAGNOSIS — Z20822 Contact with and (suspected) exposure to covid-19: Secondary | ICD-10-CM

## 2019-08-11 ENCOUNTER — Encounter: Payer: Self-pay | Admitting: Family Medicine

## 2019-08-11 LAB — NOVEL CORONAVIRUS, NAA: SARS-CoV-2, NAA: DETECTED — AB

## 2019-08-12 ENCOUNTER — Telehealth: Payer: Self-pay | Admitting: Nurse Practitioner

## 2019-08-12 NOTE — Telephone Encounter (Signed)
Called to Discuss with patient about Covid symptoms and the use of bamlanivimab, a monoclonal antibody infusion for those with mild to moderate Covid symptoms and at a high risk of hospitalization.     Pt states that symptoms started over 10 days ago.

## 2019-08-22 ENCOUNTER — Encounter: Payer: Self-pay | Admitting: Family Medicine

## 2019-09-18 ENCOUNTER — Other Ambulatory Visit: Payer: Self-pay | Admitting: Family Medicine

## 2019-09-20 DIAGNOSIS — M79672 Pain in left foot: Secondary | ICD-10-CM | POA: Diagnosis not present

## 2019-09-20 DIAGNOSIS — Z6835 Body mass index (BMI) 35.0-35.9, adult: Secondary | ICD-10-CM | POA: Diagnosis not present

## 2019-10-12 DIAGNOSIS — H25013 Cortical age-related cataract, bilateral: Secondary | ICD-10-CM | POA: Diagnosis not present

## 2019-10-12 DIAGNOSIS — H40013 Open angle with borderline findings, low risk, bilateral: Secondary | ICD-10-CM | POA: Diagnosis not present

## 2019-10-12 DIAGNOSIS — H2513 Age-related nuclear cataract, bilateral: Secondary | ICD-10-CM | POA: Diagnosis not present

## 2019-10-12 DIAGNOSIS — H35033 Hypertensive retinopathy, bilateral: Secondary | ICD-10-CM | POA: Diagnosis not present

## 2019-11-24 ENCOUNTER — Encounter: Payer: Self-pay | Admitting: Family Medicine

## 2019-11-24 DIAGNOSIS — H2589 Other age-related cataract: Secondary | ICD-10-CM | POA: Diagnosis not present

## 2019-11-24 DIAGNOSIS — H2513 Age-related nuclear cataract, bilateral: Secondary | ICD-10-CM | POA: Diagnosis not present

## 2019-11-24 DIAGNOSIS — H2512 Age-related nuclear cataract, left eye: Secondary | ICD-10-CM | POA: Diagnosis not present

## 2019-11-24 DIAGNOSIS — H25013 Cortical age-related cataract, bilateral: Secondary | ICD-10-CM | POA: Diagnosis not present

## 2019-11-24 DIAGNOSIS — H40013 Open angle with borderline findings, low risk, bilateral: Secondary | ICD-10-CM | POA: Diagnosis not present

## 2019-11-29 HISTORY — PX: CATARACT EXTRACTION: SUR2

## 2019-12-13 DIAGNOSIS — H2512 Age-related nuclear cataract, left eye: Secondary | ICD-10-CM | POA: Diagnosis not present

## 2019-12-13 DIAGNOSIS — H25812 Combined forms of age-related cataract, left eye: Secondary | ICD-10-CM | POA: Diagnosis not present

## 2019-12-23 ENCOUNTER — Other Ambulatory Visit: Payer: Self-pay | Admitting: Family Medicine

## 2019-12-23 DIAGNOSIS — Z1231 Encounter for screening mammogram for malignant neoplasm of breast: Secondary | ICD-10-CM

## 2020-01-17 DIAGNOSIS — M654 Radial styloid tenosynovitis [de Quervain]: Secondary | ICD-10-CM | POA: Diagnosis not present

## 2020-01-18 DIAGNOSIS — H2589 Other age-related cataract: Secondary | ICD-10-CM | POA: Diagnosis not present

## 2020-01-18 DIAGNOSIS — H25011 Cortical age-related cataract, right eye: Secondary | ICD-10-CM | POA: Diagnosis not present

## 2020-01-18 DIAGNOSIS — H2511 Age-related nuclear cataract, right eye: Secondary | ICD-10-CM | POA: Diagnosis not present

## 2020-01-29 HISTORY — PX: CATARACT EXTRACTION: SUR2

## 2020-01-30 ENCOUNTER — Other Ambulatory Visit: Payer: Self-pay

## 2020-01-30 ENCOUNTER — Ambulatory Visit
Admission: RE | Admit: 2020-01-30 | Discharge: 2020-01-30 | Disposition: A | Discharge status: Home / Self Care | Payer: BC Managed Care – PPO | Source: Ambulatory Visit | Attending: Family Medicine | Admitting: Family Medicine

## 2020-01-30 DIAGNOSIS — Z1231 Encounter for screening mammogram for malignant neoplasm of breast: Secondary | ICD-10-CM

## 2020-01-31 ENCOUNTER — Ambulatory Visit: Payer: BC Managed Care – PPO

## 2020-01-31 DIAGNOSIS — H2511 Age-related nuclear cataract, right eye: Secondary | ICD-10-CM | POA: Diagnosis not present

## 2020-01-31 DIAGNOSIS — H25011 Cortical age-related cataract, right eye: Secondary | ICD-10-CM | POA: Diagnosis not present

## 2020-01-31 DIAGNOSIS — H2589 Other age-related cataract: Secondary | ICD-10-CM | POA: Diagnosis not present

## 2020-01-31 DIAGNOSIS — H25811 Combined forms of age-related cataract, right eye: Secondary | ICD-10-CM | POA: Diagnosis not present

## 2020-02-14 ENCOUNTER — Telehealth: Payer: Self-pay

## 2020-02-14 ENCOUNTER — Other Ambulatory Visit: Payer: Self-pay

## 2020-02-14 ENCOUNTER — Ambulatory Visit (INDEPENDENT_AMBULATORY_CARE_PROVIDER_SITE_OTHER): Payer: BC Managed Care – PPO | Admitting: Family Medicine

## 2020-02-14 ENCOUNTER — Encounter: Payer: Self-pay | Admitting: Family Medicine

## 2020-02-14 DIAGNOSIS — R7303 Prediabetes: Secondary | ICD-10-CM

## 2020-02-14 DIAGNOSIS — I10 Essential (primary) hypertension: Secondary | ICD-10-CM

## 2020-02-14 DIAGNOSIS — R7309 Other abnormal glucose: Secondary | ICD-10-CM | POA: Diagnosis not present

## 2020-02-14 LAB — POCT GLYCOSYLATED HEMOGLOBIN (HGB A1C): HbA1c, POC (controlled diabetic range): 6.2 % (ref 0.0–7.0)

## 2020-02-14 NOTE — Assessment & Plan Note (Signed)
Hemoglobin A1C checked today = 6.2 Continue lifestyle modification.

## 2020-02-14 NOTE — Assessment & Plan Note (Addendum)
She keeps on with her exercise although diet control remains difficulty. I discussed SGLT2 therapy for weight loss. She will like to think about this. I gave her discharge information about this agents. She will f/u in few weeks to readdress this issue. FLP checked today.

## 2020-02-14 NOTE — Progress Notes (Signed)
    SUBJECTIVE:   CHIEF COMPLAINT / HPI:   PreDM: Here for f/u. No concerns.  HTN: She is compliant with Lisinopril 5 mg qd. No concerns.   Weight management: here for f/u. She has been unable to control her appetite. She is concerned that she has gained more weight.  PERTINENT  PMH / PSH: PMX reviewed.  OBJECTIVE:   Vitals:   02/14/20 1024 02/14/20 1058  BP: 140/70 138/74  Pulse: 69   SpO2: 97%   Weight: 254 lb 8 oz (115.4 kg)   Height: 5\' 6"  (1.676 m)    Body mass index is 41.08 kg/m.  Physical Exam Vitals and nursing note reviewed.  Cardiovascular:     Rate and Rhythm: Normal rate and regular rhythm.     Heart sounds: Normal heart sounds. No murmur heard.   Pulmonary:     Effort: Pulmonary effort is normal. No respiratory distress.     Breath sounds: Normal breath sounds. No wheezing.  Abdominal:     General: Abdomen is flat. Bowel sounds are normal.     Palpations: Abdomen is soft. There is no mass.     Tenderness: There is no abdominal tenderness.  Musculoskeletal:     Right lower leg: No edema.     Left lower leg: No edema.      ASSESSMENT/PLAN:   HYPERTENSION, BENIGN SYSTEMIC BP stable on her current regimen. Bmet checked.  Prediabetes Hemoglobin A1C checked today = 6.2 Continue lifestyle modification.  Morbid obesity ([place]) She keeps on with her exercise although diet control remains difficulty. I discussed SGLT2 therapy for weight loss. She will like to think about this. I gave her discharge information about this agents. She will f/u in few weeks to readdress this issue. FLP checked today.   She declined pneumovax. Although, now that she is prediabetic, she does not necessarily need it.  Andrena Mews, MD [place]

## 2020-02-14 NOTE — Telephone Encounter (Signed)
Patient calls nurse line wanting to move forward with weight loss medication. [facility] [address]. Patient reports she already spoke with them and they will have to order it and she will need to print off a discount card. Please advise.

## 2020-02-14 NOTE — Patient Instructions (Signed)
Indications and Usage WegovyT (semaglutide) injection 2.4 mg is indicated as an adjunct to a reduced calorie diet and increased physical activity for chronic weight management in adults with an initial body mass index (BMI) of ?30 kg/m2 (obesity) or ?27 kg/m2 (overweight) in the presence of at least one weight-related comorbid condition (e.g., hypertension, type 2 diabetes mellitus, or dyslipidemia).  Limitations of Use WegovyT contains semaglutide and should not be coadministered with other semaglutide-containing products or with any GLP-1 receptor agonist. The safety and effectiveness of WegovyT in combination with other products intended for weight loss, including prescription drugs, over-the-counter drugs, and herbal preparations, have not been established. Ferd Glassing has not been studied in patients with a history of pancreatitis. Important Safety Information cont. Contraindications Ferd Glassing is contraindicated in patients with a personal or family history of MTC or in patients with MEN 2, and in patients with a prior serious hypersensitivity reaction to semaglutide or to any of the excipients in WegovyT. Serious hypersensitivity reactions, including anaphylaxis and angioedema have been reported with semaglutide. Warnings and Precautions Risk of Thyroid C-Cell Tumors: Patients should be further evaluated if serum calcitonin is measured and found to be elevated or thyroid nodules are noted on physical examination or neck imaging. Acute Pancreatitis: Acute pancreatitis, including fatal and non-fatal hemorrhagic or necrotizing pancreatitis, has been observed in patients treated with GLP-1 receptor agonists, including semaglutide. Acute pancreatitis was observed in patients treated with [facility] in clinical trials. Observe patients carefully for signs and symptoms of acute pancreatitis (including persistent severe abdominal pain, sometimes radiating to the back, and which may or may not be accompanied by  vomiting). If acute pancreatitis is suspected, discontinue WegovyT promptly, and if acute pancreatitis is confirmed, do not restart. Acute Gallbladder Disease: In clinical trials, cholelithiasis was reported by 1.6% of WegovyT patients and 0.7% of placebo patients. Cholecystitis was reported by 0.6% of WegovyT patients and 0.2% of placebo patients. If cholelithiasis is suspected, gallbladder studies and appropriate clinical follow-up are indicated. Hypoglycemia: QHUTMLY lowers blood glucose and can cause hypoglycemia. In a trial of patients with type 2 diabetes, hypoglycemia was reported in 6.2% of WegovyT patients versus 2.5% of placebo patients. Patients with type 2 diabetes taking WegovyT with an insulin secretagogue (e.g. sulfonylurea) or insulin may have an increased risk of hypoglycemia, including severe hypoglycemia. Inform patients of the risk of hypoglycemia and educate them on the signs and symptoms. Monitor blood glucose in patients with type 2 diabetes. Acute Kidney Injury: There have been postmarketing reports of acute kidney injury and worsening of chronic renal failure, which in some cases required hemodialysis, in patients treated with semaglutide. Patients with renal impairment may be at a greater risk of acute kidney injury, but some events have been reported in patients without known underlying renal disease. A majority of the events occurred in patients who experienced nausea, vomiting, or diarrhea, leading to volume depletion. Monitor renal function when initiating or escalating doses of WegovyT in patients reporting severe adverse gastrointestinal reactions and in patients with renal impairment reporting any adverse reactions that could lead to volume depletion. Hypersensitivity: Serious hypersensitivity reactions (e.g., anaphylaxis, angioedema) have been reported with semaglutide. If hypersensitivity reactions occur, discontinue use of WegovyT, treat promptly per standard of care, and  monitor until signs and symptoms resolve. Use caution in a patient with a history of anaphylaxis or angioedema with another GLP-1 receptor agonist. Diabetic Retinopathy Complications in Patients with Type 2 Diabetes: In a trial of patients with type 2 diabetes, diabetic retinopathy was reported by  4.0% of WegovyT patients and 2.7% of placebo patients. Rapid improvement in glucose control has been associated with a temporary worsening of diabetic retinopathy. Patients with a history of diabetic retinopathy should be monitored for progression of diabetic retinopathy. Heart Rate Increase: Mean increases in resting heart rate of 1 to 4 beats per minute (bpm) were observed in WegovyT patients compared to placebo in clinical trials. More WegovyT patients compared with placebo had maximum changes from baseline of 10 to 19 bpm (41% versus 34%) and 20 bpm or more (26% versus 16%). Monitor heart rate at regular intervals and instruct patients to report palpitations or feelings of a racing heartbeat while at rest. If patients experience a sustained increase in resting heart rate, discontinue WegovyT. Suicidal Behavior and Ideation: Suicidal behavior and ideation have been reported in clinical trials with other weight management products. Monitor patients for depression, suicidal thoughts or behavior, and/or any unusual changes in mood or behavior. Discontinue WegovyT in patients who experience suicidal thoughts or behaviors and avoid in patients with a history of suicidal attempts or active suicidal ideation. Adverse Reactions The most common adverse reactions reported in ?5% of patients treated with [facility] are nausea, diarrhea, vomiting, constipation, abdominal pain, headache, fatigue, dyspepsia, dizziness, abdominal distention, eructation, hypoglycemia in patients with type 2 diabetes, flatulence, gastroenteritis, and gastroesophageal reflux disease. Drug Interactions The addition of WegovyT in patients treated with  insulin has not been evaluated. When initiating WegovyT, consider reducing the dose of concomitantly administered insulin secretagogues (such as sulfonylureas) or insulin to reduce the risk of hypoglycemia. Ferd Glassing causes a delay of gastric emptying and has the potential to impact the absorption of concomitantly administered oral medications. Monitor the effects of oral medications concomitantly administered with WegovyT. Use in Specific Populations Pregnancy: May cause fetal harm. When pregnancy is recognized, discontinue WegovyT. Discontinue WegovyT in patients at least 2 months before a planned pregnancy.  Please click here for [facility] Prescribing Information, including Boxed Warning.   Reference

## 2020-02-14 NOTE — Assessment & Plan Note (Signed)
BP stable on her current regimen. Bmet checked.

## 2020-02-15 MED ORDER — WEGOVY 0.25 MG/0.5ML ~~LOC~~ SOAJ: 0.2500 mg | SUBCUTANEOUS | 1 refills | Status: DC

## 2020-02-15 NOTE — Telephone Encounter (Signed)
Please contact patient and let her know that I have escribed her Wegovy. Also, have her call the office once she gets her injection from the pharmacy to schedule a Pharmacy clinic appointment to train her how to self inject this medication. Thanks.

## 2020-02-15 NOTE — Addendum Note (Signed)
Addended by: Andrena Mews T on: 02/15/2020 06:23 AM   Modules accepted: Orders

## 2020-02-15 NOTE — Progress Notes (Signed)
Patient ID: Cynthia Wilson, female   DOB: 09/15/1965, 54 y.o.   MRN: [id number] The patient contacted the office after her visit, now wanting to trial [facility].  I will escribe meds and have her set [place] pharmacy appointment for further counseling and injection training.  I have discussed side effects and given her this on her AVS summary. I will also give her a follow-up check-up call.

## 2020-02-16 NOTE — Telephone Encounter (Signed)
Patient contacted and informed of medication to the pharmacy. Patient instructed to go online and obtain coupon. Patient informed to schedule with pharmacy if she needs help with self injecting.

## 2020-02-20 ENCOUNTER — Encounter: Payer: Self-pay | Admitting: Family Medicine

## 2020-02-20 ENCOUNTER — Other Ambulatory Visit: Payer: Self-pay

## 2020-02-20 MED ORDER — WEGOVY 0.25 MG/0.5ML ~~LOC~~ SOAJ: 0.2500 mg | SUBCUTANEOUS | 1 refills | Status: DC

## 2020-02-20 NOTE — Telephone Encounter (Signed)
I gave the patient a follow-up call. She will pick up her Wegovy today. I again discussed the s/e of med, including medullary thyroid cancer. I advised her to schedule a pharmacy appointment to go over the injection and side effects once she picks up her meds. She is to come to that appointment with her [facility]. She verbalized understanding and agreed with the plan.

## 2020-02-21 ENCOUNTER — Other Ambulatory Visit: Payer: Self-pay | Admitting: Family Medicine

## 2020-02-21 MED ORDER — WEGOVY 0.25 MG/0.5ML ~~LOC~~ SOAJ: 0.2500 mg | SUBCUTANEOUS | 1 refills | Status: DC

## 2020-02-21 NOTE — Telephone Encounter (Signed)
Patient request a transfer of her Cynthia Wilson to [place]. [facility] - Pharmacist Jeani Hawking [place]).  New refill sent to [facility].

## 2020-03-06 ENCOUNTER — Other Ambulatory Visit: Payer: Self-pay | Admitting: Family Medicine

## 2020-03-06 ENCOUNTER — Encounter: Payer: Self-pay | Admitting: Family Medicine

## 2020-03-06 MED ORDER — OZEMPIC (0.25 OR 0.5 MG/DOSE) 2 MG/1.5ML ~~LOC~~ SOPN: PEN_INJECTOR | SUBCUTANEOUS | 1 refills | Status: DC

## 2020-03-06 NOTE — Telephone Encounter (Signed)
I called [facility] and cancel her [facility] prescription. Pharmacist - Wannetta Sender.  [facility].

## 2020-03-19 ENCOUNTER — Telehealth: Payer: Self-pay

## 2020-03-19 ENCOUNTER — Encounter: Payer: Self-pay | Admitting: Family Medicine

## 2020-03-19 DIAGNOSIS — E8881 Metabolic syndrome: Secondary | ICD-10-CM | POA: Insufficient documentation

## 2020-03-19 NOTE — Telephone Encounter (Signed)
Received fax from pharmacy, PA needed on Ozempic. Clinical questions placed in providers box for review. Please return to RN team basket once completed.  Cover My Meds info: Key: [id number]

## 2020-03-19 NOTE — Telephone Encounter (Signed)
Form completed and returned to RN box

## 2020-03-20 NOTE — Telephone Encounter (Signed)
Prior Auth faxed to [facility]. Will await response. This should come via fax.

## 2020-03-27 ENCOUNTER — Encounter: Payer: Self-pay | Admitting: Family Medicine

## 2020-03-29 ENCOUNTER — Ambulatory Visit (INDEPENDENT_AMBULATORY_CARE_PROVIDER_SITE_OTHER): Payer: BC Managed Care – PPO | Admitting: Pharmacist

## 2020-03-29 ENCOUNTER — Encounter: Payer: Self-pay | Admitting: Pharmacist

## 2020-03-29 ENCOUNTER — Encounter: Payer: Self-pay | Admitting: Family Medicine

## 2020-03-29 ENCOUNTER — Other Ambulatory Visit: Payer: Self-pay

## 2020-03-29 DIAGNOSIS — R7303 Prediabetes: Secondary | ICD-10-CM | POA: Diagnosis not present

## 2020-03-29 NOTE — Progress Notes (Signed)
    S:     Chief Complaint  Patient presents with  . Medication Management    Ozempic/Wegovy injection training    Patient arrives in good spirits. Presents for [employer] (semaglutide) administration teaching and counseling. Patient was referred and last seen by Primary Care Provider on 02/14/20.   Insurance coverage/medication affordability: [employer]   O:  Physical Exam Vitals reviewed.  Constitutional:      Appearance: She is obese.  Skin:    General: Skin is warm.  Neurological:     Mental Status: She is alert.  Psychiatric:        Mood and Affect: Mood normal.        Behavior: Behavior normal.        Thought Content: Thought content normal.        Judgment: Judgment normal.    Review of Systems  All other systems reviewed and are negative.    Lab Results  Component Value Date   HGBA1C 6.2 02/14/2020   Vitals:   03/29/20 1333  BP: 132/74  Pulse: 64  SpO2: 95%    Lipid Panel     Component Value Date/Time   CHOL 165 04/26/2019 0920   TRIG 61 04/26/2019 0920   HDL 53 04/26/2019 0920   CHOLHDL 3.1 04/26/2019 0920   CHOLHDL 3.9 10/30/2015 1007   VLDL 29 10/30/2015 1007   LDLCALC 100 (H) 04/26/2019 0920   LDLDIRECT 85 12/19/2013 1029    Clinical Atherosclerotic Cardiovascular Disease (ASCVD): No  The 10-year ASCVD risk score Mikey Bussing DC Jr., et al., 2013) is: 11.7%   Values used to calculate the score:     Age: 82 years     Sex: Female     Is Non-Hispanic African American: Yes     Diabetic: Yes     Tobacco smoker: No     Systolic Blood Pressure: 937 mmHg     Is BP treated: Yes     HDL Cholesterol: 53 mg/dL     Total Cholesterol: 165 mg/dL    A/P: Patient with prediabetes and obesity starting Ozempic (semaglutide) for weight loss. Patient brought her own prescription supply of Ozempic to visit. Educated on administration, dosing, adverse effects. Patient expressed understanding, all questions were answered, and successfully  self-administered first dose while in office.  -Started GLP-1 Ozempic (semaglutide) to 0.25 mg once weekly   Written patient instructions provided.  Total time in face to face counseling 30 minutes.   Follow up Pharmacist Clinic Visit in 3-4 weeks to assist with dose titration.   Patient seen with Lorel Monaco, PGY2 Pharmacy Resident, Fara Olden PGY1 Pharmacy Resident

## 2020-03-29 NOTE — Patient Instructions (Addendum)
Great to meet you today!  Summary of what we discussed:  Inject Ozempic (semaglutide) 0.25 mg once weekly for 4 weeks.  If you experience any vomiting or severe nausea, call the clinic to let us know.  When you are ready to start the [facility] in the future, please schedule a visit with the clinic so we can show you how to use it because it is slightly different.  See you in 3-4 weeks!

## 2020-03-30 NOTE — Assessment & Plan Note (Signed)
Patient with prediabetes and obesity starting Ozempic (semaglutide) for weight loss. Patient brought her own prescription supply of Ozempic to visit. Educated on administration, dosing, adverse effects. Patient expressed understanding, all questions were answered, and successfully self-administered first dose while in office.  -Started GLP-1 Ozempic (semaglutide) to 0.25 mg once weekly

## 2020-03-30 NOTE — Progress Notes (Signed)
Reviewed: I agree with Dr. Koval's documentation and management. 

## 2020-04-12 ENCOUNTER — Encounter: Payer: Self-pay | Admitting: Pharmacist

## 2020-04-12 ENCOUNTER — Other Ambulatory Visit: Payer: Self-pay

## 2020-04-12 ENCOUNTER — Ambulatory Visit (INDEPENDENT_AMBULATORY_CARE_PROVIDER_SITE_OTHER): Payer: BC Managed Care – PPO | Admitting: Pharmacist

## 2020-04-12 DIAGNOSIS — K219 Gastro-esophageal reflux disease without esophagitis: Secondary | ICD-10-CM

## 2020-04-12 MED ORDER — FAMOTIDINE 20 MG PO TABS: 20.0000 mg | ORAL_TABLET | Freq: Two times a day (BID) | ORAL | Status: DC

## 2020-04-12 MED ORDER — OZEMPIC (0.25 OR 0.5 MG/DOSE) 2 MG/1.5ML ~~LOC~~ SOPN: PEN_INJECTOR | SUBCUTANEOUS | 0 refills | Status: DC

## 2020-04-12 NOTE — Progress Notes (Signed)
S:     Chief Complaint  Patient presents with  . Medication Management    Wegovy   Patient arrives in good spirits. Presents for Ozempic (semalgutide) evaluation and counseling. Patient was referred and last seen by Primary Care Provider on 02/14/20. Last seen by pharmacy on 03/29/20 in which she was started on Ozempic (semaglutide) 0.25 mg weekly for weight loss management.   Patient reports medication adherence with Ozempic 0.25 mg weekly on Thursdays and has completed two doses so far. Reports nausea for a few days after first dose, "feels like pregnancy all over again" and did not want to eat/drink. Also reports indigestion and heart burn after eating.  Share that it brought back reflux symptoms she had in the past a  long time ago. Reports these symptoms have improved (decreased in severity) and injections are going well.  Insurance coverage/medication affordability: [employer]  Patient-reported exercise habits: exercises and lifts weight  O:  Physical Exam Vitals reviewed.  Constitutional:      Appearance: She is obese.  Neurological:     General: No focal deficit present.     Mental Status: She is alert.  Psychiatric:        Mood and Affect: Mood normal.        Behavior: Behavior normal.        Thought Content: Thought content normal.    Review of Systems  All other systems reviewed and are negative.  Lab Results  Component Value Date   HGBA1C 6.2 02/14/2020   Vitals:   04/12/20 0909  BP: 134/76  Pulse: 64  SpO2: 99%    Lipid Panel     Component Value Date/Time   CHOL 165 04/26/2019 0920   TRIG 61 04/26/2019 0920   HDL 53 04/26/2019 0920   CHOLHDL 3.1 04/26/2019 0920   CHOLHDL 3.9 10/30/2015 1007   VLDL 29 10/30/2015 1007   LDLCALC 100 (H) 04/26/2019 0920   LDLDIRECT 85 12/19/2013 1029   PHQ-9 score = 0  A/P: Patient with prediabetes and morbid obesity on Ozempic (semaglutide) 0.25 mg weekly with the goal to be eventually titrated to  Wegovy (semaglutide) 2.4 mg weekly for weight loss management. Medication adherence appears optimal as patient has lost ~8 pounds in two weeks. Patient experienced nausea initially after first dose which has improved and occasionally experiences indigestion and heartburn after eating. Recommended famotidine (Pepcid) over the counter and to avoid spicy foods to minimize reflux symptoms. Patient verbalized understanding. Patient brought own supply of Ozempic which is soon to run out in a few weeks. Patient successfully self-administered third dose while in office and was provided one sample of Ozempic for future use.  -Continued Ozempic 0.25 mg weekly for the remaining two weeks, then start 9.73 mg + 5 clicks weekly  -Recommended famotidine (Pepcid) 20 mg twice daily -Extensively discussed the importance of diet and exercise for weight loss  Medication Samples have been provided to the patient.  Drug name: Ozempic      Strength: 0.25 mg        Qty: 1 LOT: [id number]     Exp.Date: 03/2022  Dosing instructions: Inject 0.25 mg subcutaneously every week then increase as directed.  The patient has been instructed regarding the correct time, dose, and frequency of taking this medication, including desired effects and most common side effects.   Written patient instructions provided.  Total time in face to face counseling 30 minutes.   Follow up Pharmacist Clinic Visit  in 4 weeks.   Patient seen with Mercy Riding, PharmD - PGY-1 Resident and Lorel Monaco, PharmD, PGY2 Pharmacy Resident.

## 2020-04-12 NOTE — Progress Notes (Signed)
Reviewed: I agree with Dr. Koval's documentation and management. 

## 2020-04-12 NOTE — Assessment & Plan Note (Signed)
Patient with prediabetes and morbid obesity on Ozempic (semaglutide) 0.25 mg weekly with the goal to be eventually titrated to Wegovy (semaglutide) 2.4 mg weekly for weight loss management. Medication adherence appears optimal as patient has lost ~8 pounds in two weeks. Patient experienced nausea initially after first dose which has improved and occasionally recurrence of previously diagnosed indigestion and heartburn after eating. Recommended famotidine (Pepcid) over the counter and to avoid spicy foods to minimize reflux symptoms. -Recommended famotidine (Pepcid) 20 mg twice daily

## 2020-04-12 NOTE — Patient Instructions (Addendum)
Nice meeting you today!  You can buy Famotidine (Pepcid) 20 mg twice daily over the counter at a pharmacy. This will help with indigestion and heartburn.  Continue taking Ozempic 0.25 mg weekly for the remaining two weeks, then starting on October 28th you will take 7.98 mg + 5 clicks.  We will like to see you in 4 weeks on November 4th.  Call us if you have any questions or concerns!

## 2020-04-12 NOTE — Assessment & Plan Note (Signed)
Patient with prediabetes and morbid obesity on Ozempic (semaglutide) 0.25 mg weekly with the goal to be eventually titrated to Wegovy (semaglutide) 2.4 mg weekly for weight loss management. Medication adherence appears optimal as patient has lost ~8 pounds in two weeks. Patient experienced nausea initially after first dose which has improved and occasionally experiences indigestion and heartburn after eating. Recommended famotidine (Pepcid) over the counter and to avoid spicy foods to minimize reflux symptoms. Patient verbalized understanding. Patient brought own supply of Ozempic which is soon to run out in a few weeks. Patient successfully self-administered third dose while in office and was provided one sample of Ozempic for future use.  -Continued Ozempic 0.25 mg weekly for the remaining two weeks, then start 3.66 mg + 5 clicks weekly  -Recommended famotidine (Pepcid) 20 mg twice daily -Extensively discussed the importance of diet and exercise for weight loss

## 2020-04-28 DIAGNOSIS — S83241A Other tear of medial meniscus, current injury, right knee, initial encounter: Secondary | ICD-10-CM | POA: Diagnosis not present

## 2020-04-28 DIAGNOSIS — M25561 Pain in right knee: Secondary | ICD-10-CM | POA: Diagnosis not present

## 2020-05-03 ENCOUNTER — Other Ambulatory Visit: Payer: Self-pay

## 2020-05-03 ENCOUNTER — Encounter: Payer: Self-pay | Admitting: Pharmacist

## 2020-05-03 ENCOUNTER — Ambulatory Visit (INDEPENDENT_AMBULATORY_CARE_PROVIDER_SITE_OTHER): Payer: BC Managed Care – PPO | Admitting: Pharmacist

## 2020-05-03 DIAGNOSIS — E8881 Metabolic syndrome: Secondary | ICD-10-CM

## 2020-05-03 MED ORDER — OZEMPIC (0.25 OR 0.5 MG/DOSE) 2 MG/1.5ML ~~LOC~~ SOPN: PEN_INJECTOR | SUBCUTANEOUS | 0 refills | Status: AC

## 2020-05-03 NOTE — Assessment & Plan Note (Signed)
Patient with prediabetes and morbid obesity on Ozempic (semaglutide) 2.63 mg + 5 clicks weekly with the goal to be eventually titrated to Wegovy (semaglutide) 2.4 mg weekly for weight loss management. Medication adherence appears optimal and patient is tolerating Ozempic well. Discussed increasing Ozempic to 0.5 mg weekly or continuing current regimen. Patient agreed to increase dose to 0.5 mg weekly starting today. Encouraged patient to continue exercising and to incorporate fiber into her diet to help minimize constipation. Patient verbalized understanding.  -Increased Ozempic from 3.35 mg + 5 clicks to 0.5 mg weekly -Extensively discussed the importance of diet and exercise for weight loss

## 2020-05-03 NOTE — Assessment & Plan Note (Signed)
Patient with prediabetes and morbid obesity on Ozempic (semaglutide) 6.64 mg + 5 clicks weekly with the goal to be eventually titrated to Wegovy (semaglutide) 2.4 mg weekly for weight loss management. Medication adherence appears optimal and patient is tolerating Ozempic well. Discussed increasing Ozempic to 0.5 mg weekly or continuing current regimen. Patient agreed to increase dose to 0.5 mg weekly starting today. Encouraged patient to continue exercising and to incorporate fiber into her diet to help minimize constipation. Patient verbalized understanding.  -Increased Ozempic from 4.03 mg + 5 clicks to 0.5 mg weekly -Extensively discussed the importance of diet and exercise for weight loss

## 2020-05-03 NOTE — Patient Instructions (Signed)
Nice meeting you today!  I am glad that you are tolerating Ozempic well!  You can now increase Ozempic to 0.5 mg weekly starting today and continue for 3 weeks total.  Continue exercising and eating a healthy diet. Encourage you to incorporate more fiber into your diet.  We will see you in 4 weeks!

## 2020-05-03 NOTE — Progress Notes (Signed)
    S:     Chief Complaint  Patient presents with  . Medication Management    Wegovy/Ozempic for weight loss   Patient arrives in good spirits. Presents for Ozempic (semalgutide) dose titration. Patient was referred and last seen by Primary Care Provider on8/17/21.  Last seen by pharmacy on 04/12/20 in which Ozempic (semaglutide) was increased from 0.25 mg to 5.88 mg + 5 clicks weekly for weight loss management.   Patient reports medication adherence with Ozempic on Thursdays and recently increased to 3.25 mg + 5 clicks last Thursday (04/26/20). Reports nausea, indigestion, and heart burn have significantly improved and only experiences some minor nausea in the morning. Reports she started chewing food longer which has also helped relieve GI upset. Reports using pepcid as needed for reflux symptoms. Reports exercising 4x/week and attending fitness classes. Reports experiencing some constipation and has tried Miralax and Senokot.   Insurance coverage/medication affordability:[employer]  Patient-reported exercise habits: exercises and lifts weight 4x/week; attends fitness class  O:  Physical Exam Vitals reviewed.  Constitutional:      Appearance: She is obese.  Neurological:     Mental Status: She is alert.  Psychiatric:        Mood and Affect: Mood normal.        Behavior: Behavior normal.        Thought Content: Thought content normal.    Review of Systems  All other systems reviewed and are negative.   Lab Results  Component Value Date   HGBA1C 6.2 02/14/2020   Vitals:   05/03/20 0953  BP: 120/86  Pulse: 68  SpO2: 97%    Lipid Panel     Component Value Date/Time   CHOL 165 04/26/2019 0920   TRIG 61 04/26/2019 0920   HDL 53 04/26/2019 0920   CHOLHDL 3.1 04/26/2019 0920   CHOLHDL 3.9 10/30/2015 1007   VLDL 29 10/30/2015 1007   LDLCALC 100 (H) 04/26/2019 0920   LDLDIRECT 85 12/19/2013 1029    A/P: Patient with prediabetes and morbid obesity on  Ozempic (semaglutide) 4.98 mg + 5 clicks weekly with the goal to be eventually titrated to Wegovy (semaglutide) 2.4 mg weekly for weight loss management. Medication adherence appears optimal and patient is tolerating Ozempic well. Discussed increasing Ozempic to 0.5 mg weekly or continuing current regimen. Patient agreed to increase dose to 0.5 mg weekly starting today. Encouraged patient to continue exercising and to incorporate fiber into her diet to help minimize constipation. Patient verbalized understanding.  -Increased Ozempic from 2.64 mg + 5 clicks to 0.5 mg weekly -Extensively discussed the importance of diet and exercise for weight loss  Written patient instructions provided.  Total time in face to face counseling 20 minutes.    Follow up Pharmacist Clinic Visit in 4 weeks.   Patient seen with Adria Dill, PharmD Candidate, Dimple Nanas, PharmD - PGY-1 Resident and Lorel Monaco, PharmD, PGY2 Pharmacy Resident.

## 2020-05-03 NOTE — Progress Notes (Signed)
Reviewed: I agree with Dr. Koval's documentation and management. 

## 2020-05-29 DIAGNOSIS — M25561 Pain in right knee: Secondary | ICD-10-CM | POA: Diagnosis not present

## 2020-05-29 DIAGNOSIS — M1711 Unilateral primary osteoarthritis, right knee: Secondary | ICD-10-CM | POA: Diagnosis not present

## 2020-06-07 ENCOUNTER — Ambulatory Visit: Payer: BC Managed Care – PPO | Admitting: Pharmacist

## 2020-06-07 ENCOUNTER — Encounter: Payer: Self-pay | Admitting: Pharmacist

## 2020-06-07 ENCOUNTER — Other Ambulatory Visit: Payer: Self-pay

## 2020-06-07 DIAGNOSIS — I10 Essential (primary) hypertension: Secondary | ICD-10-CM

## 2020-06-07 MED ORDER — SEMAGLUTIDE(0.25 OR 0.5MG/DOS) 2 MG/1.5ML ~~LOC~~ SOPN: 0.5000 mg | PEN_INJECTOR | SUBCUTANEOUS | Status: DC

## 2020-06-07 NOTE — Progress Notes (Signed)
Reviewed: I agree with Dr. Koval's documentation and management. 

## 2020-06-07 NOTE — Patient Instructions (Addendum)
Great to see you today.  Progress is fantastic!  Keep up the great work.  Continue with exercise plan!    Consider use of docusate (Colace = brand name) should help with stools.

## 2020-06-07 NOTE — Assessment & Plan Note (Signed)
Hypertension longstanding currently with elevated in-office reading in a patient who did not take AM BP med this morning. Blood pressure goal = < 130/80 mmHg. Medication adherence good.  Blood pressure control is likely to improve with continued weight loss. - no change in meds today.  Continue lisinopril 5mg  once daily.  Reassess and potentially adjust at next visit.

## 2020-06-07 NOTE — Progress Notes (Signed)
S:     Chief Complaint  Patient presents with  . Medication Management    Weight loss    Patient arrives in great spirits, ambulating without assistance. Presents for weight loss evaluation, education, and management. Patient was referred and last seen by Primary Care Provider, Dr. Gwendlyn Deutscher on 03/29/2020.  Patient was delighted to see that she had lost 6 pounds since last visit.  she denies any symptoms of hyperglycemia.  We agreed to defer additiaonl A1c evaluation as last reading was 6.2 in August 2021.   Family/Social History: Stressful lifestyle lately with brother having knee replacement.  She continues to cook for her mother and two brothers at this time.  She has noticed a significant reduction in her appetite and has noticed GI intolerance (nausea) is worst if she overeats.   She has noticed smaller pellitized stools recently.    Notes adequate supply of semaglutide for injections  Medication adherence reported good.    Current hypertension medications include: lisinpril 5mg  once daily  (did not take today prior to visit).  Current hyperlipidemia medications include: none.   Patient reported dietary habits: smaller portions with all meals.   Patient-reported exercise habits: consistently 4 days per week at the gym and also is using eliptical trainer at home.    Patient denies nocturia (nighttime urination).     O:  Physical Exam Constitutional:      Appearance: Normal appearance.  Pulmonary:     Effort: Pulmonary effort is normal.  Neurological:     Mental Status: She is alert.  Psychiatric:        Mood and Affect: Mood normal.        Thought Content: Thought content normal.    Review of Systems  Gastrointestinal: Positive for constipation. Negative for abdominal pain and nausea.     Lab Results  Component Value Date   HGBA1C 6.2 02/14/2020   Vitals:   06/07/20 0906  BP: (!) 144/96  Pulse: 68  SpO2: 98%    Lipid Panel     Component Value  Date/Time   CHOL 165 04/26/2019 0920   TRIG 61 04/26/2019 0920   HDL 53 04/26/2019 0920   CHOLHDL 3.1 04/26/2019 0920   CHOLHDL 3.9 10/30/2015 1007   VLDL 29 10/30/2015 1007   LDLCALC 100 (H) 04/26/2019 0920   LDLDIRECT 85 12/19/2013 1029     Clinical Atherosclerotic Cardiovascular Disease (ASCVD): No  The 10-year ASCVD risk score Mikey Bussing DC Jr., et al., 2013) is: 15.4%   Values used to calculate the score:     Age: 54 years     Sex: Female     Is Non-Hispanic African American: Yes     Diabetic: Yes     Tobacco smoker: No     Systolic Blood Pressure: 993 mmHg     Is BP treated: Yes     HDL Cholesterol: 53 mg/dL     Total Cholesterol: 165 mg/dL    A/P: Weight loss of 6 pounds since last visit in patient with history of obesity.  Tolerating semaglutide 0.5mg  once weekly on Thursday with minimal adverse effect.  -Extensively discussed keys to weight maintenance and potential loss over the holiday season.  -Next A1C anticipated at next PCP visit in 1-2 months.   Hypertension longstanding currently with elevated in-office reading in a patient who did not take AM BP med this morning. Blood pressure goal = < 130/80 mmHg. Medication adherence good.  Blood pressure control is likely to improve with  continued weight loss. - no change in meds today.  Continue lisinopril 5mg  once daily.  Reassess and potentially adjust at next visit.   Written patient instructions provided.  Total time in face to face counseling 15 minutes.   Follow up Pharmacist Clinic Visit 07/10/2019.  Next PCP visit in early 2022.

## 2020-06-07 NOTE — Assessment & Plan Note (Signed)
Weight loss of 6 pounds since last visit in patient with history of obesity.  Tolerating semaglutide 0.5mg  once weekly on Thursday with minimal adverse effect.  -Extensively discussed keys to weight maintenance and potential loss over the holiday season.  -Next A1C anticipated at next PCP visit in 1-2 months.

## 2020-06-11 DIAGNOSIS — H18513 Endothelial corneal dystrophy, bilateral: Secondary | ICD-10-CM | POA: Diagnosis not present

## 2020-06-11 DIAGNOSIS — H5319 Other subjective visual disturbances: Secondary | ICD-10-CM | POA: Diagnosis not present

## 2020-06-11 LAB — HM DIABETES EYE EXAM

## 2020-06-21 ENCOUNTER — Ambulatory Visit (INDEPENDENT_AMBULATORY_CARE_PROVIDER_SITE_OTHER): Payer: BC Managed Care – PPO

## 2020-06-21 ENCOUNTER — Other Ambulatory Visit: Payer: Self-pay

## 2020-06-21 DIAGNOSIS — Z23 Encounter for immunization: Secondary | ICD-10-CM | POA: Diagnosis not present

## 2020-06-21 NOTE — Progress Notes (Signed)
   Covid-19 Vaccination Clinic  Name:  Cynthia Wilson    MRN: [id number] DOB: [birth year]  06/21/2020   Patient presents to nurse clinic for [place] booster. Administered in LD, site unremarkable, tolerated injection well.   Ms. Ellenberger was observed post Covid-19 immunization for 15 minutes without incident. She was provided with Vaccine Information Sheet and instruction to access the V-Safe system.   Ms. Zuluaga was instructed to call 911 with any severe reactions post vaccine: [name] Difficulty breathing  . Swelling of face and throat  . A fast heartbeat  . A bad rash all over body  . Dizziness and weakness    Provided patient with updated immunization record and card.   Talbot Grumbling, RN

## 2020-06-30 HISTORY — PX: CORNEAL TRANSPLANT: SHX108

## 2020-07-09 ENCOUNTER — Other Ambulatory Visit: Payer: Self-pay | Admitting: Family Medicine

## 2020-07-09 ENCOUNTER — Ambulatory Visit: Payer: BC Managed Care – PPO | Admitting: Pharmacist

## 2020-07-09 ENCOUNTER — Encounter: Payer: Self-pay | Admitting: Pharmacist

## 2020-07-09 ENCOUNTER — Other Ambulatory Visit: Payer: Self-pay

## 2020-07-09 MED ORDER — WEGOVY 1 MG/0.5ML ~~LOC~~ SOAJ: 1.0000 mg | SUBCUTANEOUS | 3 refills | Status: DC

## 2020-07-09 NOTE — Patient Instructions (Signed)
Great to see you again today!  Congratulations are losing weight during the holidays!  Short-term goal weight loss is 5 pounds to reach a weight which is the lowest you have been in 10 years!  Increase dose from 0.5mg  of semglutide to 1 mg with WEGOVY.  We will call you and share the plan if it is different once we contact your pharmacy.

## 2020-07-09 NOTE — Assessment & Plan Note (Signed)
History of longstanding obesity with minimal weight change for over 10 years.   She had made progress on weight loss with semaglutide 0.5mg  once weekly without any significant side effects.  Receptive to dose increase.   Pharmacy was contacted regarding stock of Wegovy, confirmed that while they did not have any currently, it would be stocked ~1 day upon reception of prescription. Patient was subsequently called and told to call pharmacy to confirm this. Patient understands that if pharmacy fails to [place] or if there are issues with insurance coverage, she should call back to the clinic for next

## 2020-07-09 NOTE — Progress Notes (Signed)
Reviewed: I agree with Dr. Koval's documentation and management. 

## 2020-07-09 NOTE — Progress Notes (Signed)
S:     Chief Complaint  Patient presents with  . Medication Management    Weight Loss, Hypertension    Patient arrives in good spirits, ambulating without assistance.  Presents for weight loss evaluation, education, and management.  Patient was referred and last seen by Primary Care Provider on 03/29/2020. Has multiple visits with Rx clinic in the last few months.   Patient reports doing well with weight loss over the holiday season.   Insurance coverage/medication affordability:  Reports WEGOVU (semalgutide) was covered by her insurance.   Medication adherence reported good, without any significant side effect.  Mild fatigue on day of injection is her only reported side effect.    Current hypertension medications include: lisinopril 5mg  once daily   O:  Physical Exam Vitals reviewed.  Constitutional:      Appearance: She is obese.  Pulmonary:     Effort: Pulmonary effort is normal.  Neurological:     Mental Status: She is alert.  Psychiatric:        Mood and Affect: Mood normal.        Behavior: Behavior normal.        Thought Content: Thought content normal.    Review of Systems  All other systems reviewed and are negative.    Lab Results  Component Value Date   HGBA1C 6.2 02/14/2020   Vitals:   07/09/20 0838  BP: 120/78  Pulse: 64  SpO2: 96%    Lipid Panel     Component Value Date/Time   CHOL 165 04/26/2019 0920   TRIG 61 04/26/2019 0920   HDL 53 04/26/2019 0920   CHOLHDL 3.1 04/26/2019 0920   CHOLHDL 3.9 10/30/2015 1007   VLDL 29 10/30/2015 1007   LDLCALC 100 (H) 04/26/2019 0920   LDLDIRECT 85 12/19/2013 1029      Clinical Atherosclerotic Cardiovascular Disease (ASCVD): No  The 10-year ASCVD risk score Mikey Bussing DC Jr., et al., 2013) is: 8.6%   Values used to calculate the score:     Age: 55 years     Sex: Female     Is Non-Hispanic African American: Yes     Diabetic: Yes     Tobacco smoker: No     Systolic Blood Pressure: 536 mmHg      Is BP treated: Yes     HDL Cholesterol: 53 mg/dL     Total Cholesterol: 165 mg/dL    A/P: History of longstanding obesity with minimal weight change for over 10 years.   She had made progress on weight loss with semaglutide 0.5mg  once weekly without any significant side effects.  Receptive to dose increase.   Pharmacy was contacted regarding stock of Wegovy, confirmed that while they did not have any currently, it would be stocked ~1 day upon reception of prescription. Patient was subsequently called and told to call pharmacy to confirm this. Patient understands that if pharmacy fails to [place] or if there are issues with insurance coverage, she should call back to the clinic for next steps.   Hypertension longstanding and currently at goal.  Blood pressure 120/78 and tolerating lisinopril 5mg  once daily.  Medication adherence good, took medication this morning.   No change in treatment at this time.   Written patient instructions provided.  Total time in face to face counseling 15 minutes.    Follow up PCP, Dr. Gwendlyn Deutscher in the next 4-8 weeks for weight check and possible dose increase of Wegovy/semaglutide.   Patient seen with Toma Aran,  PharmD Candidate, and Hughes Better, PharmD.

## 2020-07-11 ENCOUNTER — Telehealth: Payer: Self-pay | Admitting: Pharmacist

## 2020-07-11 MED ORDER — OZEMPIC (1 MG/DOSE) 4 MG/3ML ~~LOC~~ SOPN: 1.0000 mg | PEN_INJECTOR | SUBCUTANEOUS | 2 refills | Status: DC

## 2020-07-11 NOTE — Telephone Encounter (Signed)
Patient called and left voice mail that Wegovy (semaglutide) is no longer covered by her insurance.   An alternative option of continuing with Ozempic (semaglutide) was discussed at previous visit.  New prescription for Ozempic (semaglutide) 1mg  once weekly was sent to her pharmacy. This is a dose increase from previous.  Patient is aware to call with any new side effects.    Return call to patient a 12:15 to share treatment plan.  She verbalized that the Ozempic 0.5mg  was covered on her insurance in the past.  She anticipates it will be covered currently.   I asked her to return call if any supply issues arise.  I encourage her continued use and success with use of Ozempic.

## 2020-07-11 NOTE — Telephone Encounter (Signed)
Noted and agree. 

## 2020-07-12 ENCOUNTER — Telehealth: Payer: Self-pay | Admitting: Pharmacist

## 2020-07-12 NOTE — Telephone Encounter (Signed)
Patient called and shared that her pharmacy has not been able to process her prescription at this time. She requests Ozempic (semaglutide) sample to keep her "on-schedule".   Medication Samples have been provided to the patient.  Drug name: Ozempic (semaglutide)    0.25/0.5mg  pen        Qty: 1  LOT: [id number]  Exp.Date: 06/29/2022  Dosing instructions: Take 2 injections of 0.5mg  Weekly.  The patient has been instructed regarding the correct time, dose, and frequency of taking this medication, including desired effects and most common side effects.   Cynthia Wilson 9:19 AM 07/12/2020   Patient is aware that this pen, at the above dose will last for 2 weeks.  She will begin higher dose with newly prescribed pen 1mg  once weekly (single injection) two weeks from today.

## 2020-07-12 NOTE — Telephone Encounter (Signed)
Noted and agree. 

## 2020-07-27 DIAGNOSIS — H18513 Endothelial corneal dystrophy, bilateral: Secondary | ICD-10-CM | POA: Diagnosis not present

## 2020-07-27 DIAGNOSIS — H40003 Preglaucoma, unspecified, bilateral: Secondary | ICD-10-CM | POA: Diagnosis not present

## 2020-07-31 ENCOUNTER — Ambulatory Visit: Payer: BC Managed Care – PPO | Admitting: Family Medicine

## 2020-07-31 ENCOUNTER — Encounter: Payer: Self-pay | Admitting: Family Medicine

## 2020-07-31 ENCOUNTER — Other Ambulatory Visit: Payer: Self-pay

## 2020-07-31 DIAGNOSIS — E042 Nontoxic multinodular goiter: Secondary | ICD-10-CM | POA: Diagnosis not present

## 2020-07-31 DIAGNOSIS — N644 Mastodynia: Secondary | ICD-10-CM

## 2020-07-31 DIAGNOSIS — E8881 Metabolic syndrome: Secondary | ICD-10-CM | POA: Diagnosis not present

## 2020-07-31 DIAGNOSIS — I10 Essential (primary) hypertension: Secondary | ICD-10-CM | POA: Diagnosis not present

## 2020-07-31 DIAGNOSIS — R7309 Other abnormal glucose: Secondary | ICD-10-CM | POA: Diagnosis not present

## 2020-07-31 DIAGNOSIS — R7303 Prediabetes: Secondary | ICD-10-CM

## 2020-07-31 LAB — POCT GLYCOSYLATED HEMOGLOBIN (HGB A1C): Hemoglobin A1C: 5.8 % — AB (ref 4.0–5.6)

## 2020-07-31 NOTE — Assessment & Plan Note (Addendum)
Great job on losing weight.  Checked FLP and Cmet today.

## 2020-07-31 NOTE — Patient Instructions (Addendum)
Congratulations on your weight loss. Your A1C also looks great. We will continue Ozempic at 1 mg weekly for now. Continue to work on exercise and diet control.  Your BP is elevated today, despite multiple checks. We will monitor for the next 4 weeks at your preference and if still elevated, we will need to adjust your BP medication.

## 2020-07-31 NOTE — Assessment & Plan Note (Signed)
Loss tremendous amount of weight since she started Ozempic I commended her on this. Med refilled.

## 2020-07-31 NOTE — Assessment & Plan Note (Signed)
Asymptomatic. Checked TSH today.

## 2020-07-31 NOTE — Assessment & Plan Note (Signed)
BP uncontrolled. Multiple checks during this visit remains elevated. I recommended increasing Lisinopril 5 mg vs working on diet and exercise and recheck in about 4 weeks. She opted for the later options. F/U appointment made for her.

## 2020-07-31 NOTE — Assessment & Plan Note (Signed)
A1C= 5.8 today. Improved from her last. Continue Ozempic at 1 mg weekly. Refills escribed.

## 2020-07-31 NOTE — Progress Notes (Signed)
    SUBJECTIVE:   CHIEF COMPLAINT / HPI:   Breast pain: C/O Left breast x 1 week. Denies any trauma to the area. No nipples discharge, no swelling. No lumps. She is postmenopausal. This is concerning to her.  Pre-DM/Metabolic syndrome: Compliant with diet and exercise as well as weekly 1 mg Ozempic injection. She is here for follow-up.  Multinodular goiter: No concerns. Per the patient, her endocrinologist suggested she will need no follow-up with them on this.  HTN:She is compliant with Lisinopril 5 mg qd. Last dose was this morning.  PERTINENT  PMH / PSH: PMX reviewed  OBJECTIVE:   Vitals:   07/31/20 0932 07/31/20 0949 07/31/20 0951  BP: (!) 142/88 (!) 151/86 (!) 145/86  Pulse: 71 69   SpO2: 100%    Weight: 237 lb 12.8 oz (107.9 kg)    Height: 5\' 7"  (1.702 m)      Physical Exam Vitals and nursing note reviewed.  Cardiovascular:     Rate and Rhythm: Normal rate and regular rhythm.     Heart sounds: Normal heart sounds. No murmur heard.   Pulmonary:     Effort: Pulmonary effort is normal. No respiratory distress.     Breath sounds: Normal breath sounds. No wheezing.  Abdominal:     General: Abdomen is flat. Bowel sounds are normal. There is no distension.     Palpations: Abdomen is soft. There is no mass.     Tenderness: There is no abdominal tenderness.  Musculoskeletal:     Right lower leg: No edema.     Left lower leg: No edema.      ASSESSMENT/PLAN:  Breast tenderness: Normal recent mammogram done August 2021. Diagnostic mammogram ordered. I gave her GI card to contact the for her mammogram.  Prediabetes A1C= 5.8 today. Improved from her last. Continue Ozempic at 1 mg weekly. Refills escribed.  Morbid obesity ([place]) Loss tremendous amount of weight since she started Ozempic I commended her on this. Med refilled.  Metabolic syndrome Great job on losing weight.  Checked FLP and Cmet today.  Multinodular goiter Asymptomatic. Checked TSH  today.  HYPERTENSION, BENIGN SYSTEMIC BP uncontrolled. Multiple checks during this visit remains elevated. I recommended increasing Lisinopril 5 mg vs working on diet and exercise and recheck in about 4 weeks. She opted for the later options. F/U appointment made for her.   HM: Shingrix discussed. She declined vaccine for now.  Andrena Mews, MD [place]

## 2020-08-01 ENCOUNTER — Other Ambulatory Visit: Payer: Self-pay | Admitting: Family Medicine

## 2020-08-01 ENCOUNTER — Telehealth: Payer: Self-pay | Admitting: Family Medicine

## 2020-08-01 DIAGNOSIS — N644 Mastodynia: Secondary | ICD-10-CM

## 2020-08-01 LAB — CMP14+EGFR
ALT: 18 IU/L (ref 0–32)
AST: 29 IU/L (ref 0–40)
Albumin/Globulin Ratio: 1.8 (ref 1.2–2.2)
Albumin: 4.2 g/dL (ref 3.8–4.9)
Alkaline Phosphatase: 72 IU/L (ref 44–121)
BUN/Creatinine Ratio: 15 (ref 9–23)
BUN: 14 mg/dL (ref 6–24)
Bilirubin Total: 0.3 mg/dL (ref 0.0–1.2)
CO2: 26 mmol/L (ref 20–29)
Calcium: 9.2 mg/dL (ref 8.7–10.2)
Chloride: 106 mmol/L (ref 96–106)
Creatinine, Ser: 0.94 mg/dL (ref 0.57–1.00)
GFR calc Af Amer: 78 mL/min/{1.73_m2} (ref >59)
GFR calc non Af Amer: 68 mL/min/{1.73_m2} (ref >59)
Globulin, Total: 2.4 g/dL (ref 1.5–4.5)
Glucose: 88 mg/dL (ref 65–99)
Potassium: 4.6 mmol/L (ref 3.5–5.2)
Sodium: 145 mmol/L — ABNORMAL HIGH (ref 134–144)
Total Protein: 6.6 g/dL (ref 6.0–8.5)

## 2020-08-01 LAB — LIPID PANEL
Chol/HDL Ratio: 3.6 ratio (ref 0.0–4.4)
Cholesterol, Total: 156 mg/dL (ref 100–199)
HDL: 43 mg/dL (ref >39)
LDL Chol Calc (NIH): 97 mg/dL (ref 0–99)
Triglycerides: 82 mg/dL (ref 0–149)
VLDL Cholesterol Cal: 16 mg/dL (ref 5–40)

## 2020-08-01 LAB — TSH: TSH: 0.192 u[IU]/mL — ABNORMAL LOW (ref 0.450–4.500)

## 2020-08-01 NOTE — Telephone Encounter (Signed)
Result discussed.  Mild Hypernatremia: Hydration discussed. May recheck at her next visit.  TSH is low. She has an appointment with me in about 3 weeks. We will recheck then.  She agreed with the plan.

## 2020-08-19 ENCOUNTER — Other Ambulatory Visit: Payer: Self-pay | Admitting: Family Medicine

## 2020-08-19 DIAGNOSIS — R0982 Postnasal drip: Secondary | ICD-10-CM

## 2020-08-20 ENCOUNTER — Encounter: Payer: Self-pay | Admitting: Family Medicine

## 2020-08-21 ENCOUNTER — Other Ambulatory Visit: Payer: Self-pay

## 2020-08-21 ENCOUNTER — Ambulatory Visit (INDEPENDENT_AMBULATORY_CARE_PROVIDER_SITE_OTHER): Payer: BC Managed Care – PPO | Admitting: Family Medicine

## 2020-08-21 ENCOUNTER — Encounter: Payer: Self-pay | Admitting: Family Medicine

## 2020-08-21 VITALS — BP 129/81 | HR 67 | Ht 67.0 in | Wt 235.2 lb

## 2020-08-21 DIAGNOSIS — K5900 Constipation, unspecified: Secondary | ICD-10-CM | POA: Diagnosis not present

## 2020-08-21 DIAGNOSIS — I10 Essential (primary) hypertension: Secondary | ICD-10-CM

## 2020-08-21 DIAGNOSIS — E0590 Thyrotoxicosis, unspecified without thyrotoxic crisis or storm: Secondary | ICD-10-CM

## 2020-08-21 DIAGNOSIS — M654 Radial styloid tenosynovitis [de Quervain]: Secondary | ICD-10-CM

## 2020-08-21 DIAGNOSIS — E871 Hypo-osmolality and hyponatremia: Secondary | ICD-10-CM | POA: Diagnosis not present

## 2020-08-21 MED ORDER — IBUPROFEN 600 MG PO TABS: 600.0000 mg | ORAL_TABLET | Freq: Three times a day (TID) | ORAL | 0 refills | Status: DC | PRN

## 2020-08-21 NOTE — Progress Notes (Signed)
    SUBJECTIVE:   CHIEF COMPLAINT / HPI:   HTN:She is here for follow-up. Compliant with her Lisinopril 5 mg QD. No concerns.  Thyroid dysfunction: No concerns. Does have chronic hot flashes - post menopausal. Here to f/u on labs.  Hyponatremia: No concerns. She is getting adequate hydration. Here for f/u.  Constipation: Last BM was 1 weeks ago. Been using Colace daily since 3 days ago with no improvement. She denies abdominal pain or other GI symptoms.  PERTINENT  PMH / PSH: PMX reviewed.  OBJECTIVE:   Vitals:   08/21/20 0826 08/21/20 0841 08/21/20 0843  BP: 114/80 139/88 129/81  Pulse: 75 67   SpO2: 99%    Weight: 235 lb 4 oz (106.7 kg)    Height: 5\' 7"  (1.702 m)      Physical Exam Vitals and nursing note reviewed.  Neck:     Comments: No obvious thyromegaly. Unfortunately, I did not palpate her thyroid today. Cardiovascular:     Rate and Rhythm: Normal rate and regular rhythm.     Heart sounds: Normal heart sounds. No murmur heard.   Pulmonary:     Effort: Pulmonary effort is normal. No respiratory distress.     Breath sounds: Normal breath sounds. No wheezing or rhonchi.  Abdominal:     General: Bowel sounds are normal. There is no distension.     Palpations: Abdomen is soft. There is no mass.     Tenderness: There is no abdominal tenderness.  Musculoskeletal:     Right lower leg: No edema.     Left lower leg: No edema.      ASSESSMENT/PLAN:   HYPERTENSION, BENIGN SYSTEMIC BP at goal today. Continue Lisinopril 5 mg qd. F/U in 3 months.  Subclinical thyrotoxicosis Asymptomatic. Documented thyroid nodules in the past s/p thyroid U/S and biopsy in 2018. Biopsy report reviewed and per endo's documentation, they recommended repeat biopsy in 6, which was not done. Recheck TFT today. I will call with result and further instructions.  Hyponatremia Mild. Rechecked today.  Constipation May add Miralax to Colace prn.      Andrena Mews, MD [place]

## 2020-08-21 NOTE — Assessment & Plan Note (Signed)
Mild. Rechecked today.

## 2020-08-21 NOTE — Assessment & Plan Note (Signed)
May add Miralax to Colace prn.

## 2020-08-21 NOTE — Assessment & Plan Note (Signed)
BP at goal today. Continue Lisinopril 5 mg qd. F/U in 3 months.

## 2020-08-21 NOTE — Assessment & Plan Note (Signed)
Asymptomatic. Documented thyroid nodules in the past s/p thyroid U/S and biopsy in 2018. Biopsy report reviewed and per endo's documentation, they recommended repeat biopsy in 6, which was not done. Recheck TFT today. I will call with result and further instructions.

## 2020-08-21 NOTE — Patient Instructions (Signed)
Hypernatremia Hypernatremia is when your blood has too much salt (sodium) in it and not enough water. This is an electrolyte disorder. Electrolytes are minerals found in the body's blood and tissues. Salt is one of these minerals. Electrolyte disorders occur when the level of minerals is either too high or too low in the body. Water and salt must be balanced in the blood. When the level of salt is too high, this is serious and can be an emergency. What are the causes? This condition may be caused by:  Not drinking enough water.  Certain medicines, such as diuretics.  Water loss through exercise or sweating.  Having watery poop (diarrhea).  Vomiting.  Eating too much salt. Other causes include conditions that:  Affect a person's memory.  Cause the kidneys to remove too much pee (urine) from the body. This is often related to diabetes.  Cause the body to make too much of a hormone called cortisol.  Affect the balance of salt and water in the body.  Affect the kidneys. What are the signs or symptoms? Symptoms of this condition may include:  Being more thirsty than usual.  Peeing more than usual.  Muscle twitching or cramps.  Feeling dizzy or light-headed.  Headache.  Feeling tired.  Feeling sick to your stomach (nausea).  Vomiting.  Having watery poop.  Fever.  Weakness.  Getting angry or annoyed easily (irritability). Very bad symptoms of this condition include:  Confusion.  Passing out.  Seizures.  Coma. How is this treated? This condition may need to be treated right away. In some cases, you may need to stay in the hospital. Treatment may include:  Drinking more water.  Fluids given through an IV tube put into one of your veins.  Changes in the medicines that you are taking.  Keeping you from having a fever.  Diabetes control. Lab tests will be done to make sure treatment is working. Follow these instructions at home:  Drink enough fluid  to keep your pee pale yellow.  Take over-the-counter and prescription medicines only as told by your doctor.  Avoid salty processed foods. This includes canned, jarred, frozen, or boxed foods. Some examples include: ? Pickles. ? Frozen dinners. ? Canned soups. ? Potato and corn chips. ? Olives.  Follow any other diet instructions from your doctor.  Always drink fluids after you exercise.  Always drink fluids after you vomit or have watery poop.  Keep all follow-up visits as told by your doctor. This is important.   Contact a doctor if:  You have watery poop.  You vomit.  You have a fever or chills.  You are not able to follow advice from your doctor about drinking fluids. Get help right away if:  You feel light-headed and weak.  You have a fast heartbeat.  You have problems breathing.  You get confused.  You get restless.  You get angry or annoyed easily.  You have a seizure.  You faint.  You have signs of not having enough water in your body (dehydration), such as: ? Dark pee. ? Very little or no pee. ? Cracked lips. ? No tears. ? Dry mouth. ? Sunken eyes. ? Feeling sleepy. Summary  Hypernatremia is when your blood has too much salt in it and not enough water.  You may need to stay in the hospital to be treated.  Treatment may include drinking more water or getting fluids through an IV tube.  Lab tests will be done to make sure   treatment is working.  Drink enough fluid to keep your pee pale yellow. This information is not intended to replace advice given to you by your health care provider. Make sure you discuss any questions you have with your health care provider. Document Revised: 06/03/2018 Document Reviewed: 06/03/2018 Elsevier Patient Education  2021 Elsevier Inc.  

## 2020-08-27 DIAGNOSIS — H40003 Preglaucoma, unspecified, bilateral: Secondary | ICD-10-CM | POA: Diagnosis not present

## 2020-08-27 DIAGNOSIS — H18513 Endothelial corneal dystrophy, bilateral: Secondary | ICD-10-CM | POA: Diagnosis not present

## 2020-08-29 ENCOUNTER — Telehealth: Payer: Self-pay | Admitting: Family Medicine

## 2020-08-29 DIAGNOSIS — E0590 Thyrotoxicosis, unspecified without thyrotoxic crisis or storm: Secondary | ICD-10-CM

## 2020-08-29 NOTE — Telephone Encounter (Signed)
I discussed TFT results with the patient. Free T4 is still pending at this time. As discussed with her, given the insufficient biopsy report in 2018 and Dr. Arman Filter recommendation of repeat thyroid gland biopsy, I will refer her back to Endo.  She agreed with the plan.

## 2020-08-30 LAB — BASIC METABOLIC PANEL
BUN/Creatinine Ratio: 12 (ref 9–23)
BUN: 11 mg/dL (ref 6–24)
CO2: 24 mmol/L (ref 20–29)
Calcium: 9 mg/dL (ref 8.7–10.2)
Chloride: 104 mmol/L (ref 96–106)
Creatinine, Ser: 0.95 mg/dL (ref 0.57–1.00)
GFR calc Af Amer: 77 mL/min/{1.73_m2} (ref >59)
GFR calc non Af Amer: 67 mL/min/{1.73_m2} (ref >59)
Glucose: 89 mg/dL (ref 65–99)
Potassium: 4 mmol/L (ref 3.5–5.2)
Sodium: 142 mmol/L (ref 134–144)

## 2020-08-30 LAB — TSH+T3+FREE T4+T3 FREE
Free T-3: 3.7 pg/mL
Free T4 by Dialysis: 1.3 ng/dL
TSH: 0.07 uU/mL — ABNORMAL LOW
Triiodothyronine (T-3), Serum: 135 ng/dL

## 2020-09-11 ENCOUNTER — Ambulatory Visit
Admission: RE | Admit: 2020-09-11 | Discharge: 2020-09-11 | Disposition: A | Discharge status: Home / Self Care | Payer: BC Managed Care – PPO | Source: Ambulatory Visit | Attending: Family Medicine | Admitting: Family Medicine

## 2020-09-11 ENCOUNTER — Other Ambulatory Visit: Payer: Self-pay

## 2020-09-11 DIAGNOSIS — N644 Mastodynia: Secondary | ICD-10-CM

## 2020-09-11 DIAGNOSIS — R922 Inconclusive mammogram: Secondary | ICD-10-CM | POA: Diagnosis not present

## 2020-09-20 DIAGNOSIS — M19072 Primary osteoarthritis, left ankle and foot: Secondary | ICD-10-CM | POA: Diagnosis not present

## 2020-09-26 ENCOUNTER — Ambulatory Visit: Payer: BC Managed Care – PPO | Admitting: Internal Medicine

## 2020-09-26 ENCOUNTER — Encounter: Payer: Self-pay | Admitting: Internal Medicine

## 2020-09-26 ENCOUNTER — Other Ambulatory Visit: Payer: Self-pay

## 2020-09-26 VITALS — BP 128/82 | HR 73 | Ht 67.0 in | Wt 232.2 lb

## 2020-09-26 DIAGNOSIS — E0590 Thyrotoxicosis, unspecified without thyrotoxic crisis or storm: Secondary | ICD-10-CM | POA: Diagnosis not present

## 2020-09-26 DIAGNOSIS — E042 Nontoxic multinodular goiter: Secondary | ICD-10-CM

## 2020-09-26 LAB — T4, FREE: Free T4: 0.81 ng/dL (ref 0.60–1.60)

## 2020-09-26 LAB — TSH: TSH: 0.16 u[IU]/mL — ABNORMAL LOW (ref 0.35–4.50)

## 2020-09-26 LAB — T3, FREE: T3, Free: 2.9 pg/mL (ref 2.3–4.2)

## 2020-09-26 NOTE — Patient Instructions (Addendum)
Please stop at the lab.  If you are not contacted about this in the next ~5 days, please call the following number to schedule it:  Zacarias Pontes, [place] central scheduling (for thyroid uptake and scan): [phone].  Please return in 6 months.

## 2020-09-26 NOTE — Progress Notes (Signed)
Patient ID: Cynthia Wilson, female   DOB: Mar 11, 1966, 55 y.o.   MRN: [id number]  This visit occurred during the SARS-CoV-2 public health emergency.  Safety protocols were in place, including screening questions prior to the visit, additional usage of staff PPE, and extensive cleaning of exam room while observing appropriate contact time as indicated for disinfecting solutions.   HPI  Cynthia Wilson is a 55 y.o.-year-old female, referred by her PCP, Dr. Gwendlyn Deutscher, in consultation for subclinical thyrotoxicosis and goiter.  I did see the patient for these problems in the past, with last visit on 07/25/2016, more than 3 years ago.  She was diagnosed with a multinodular goiter in 2004.  I reviewed her TFTs:: Lab Results  Component Value Date   TSH 0.192 (L) 07/31/2020   TSH 0.496 04/28/2018   TSH 0.22 (L) 07/25/2016   TSH 0.69 07/25/2014   TSH 0.268 (L) 07/07/2014   TSH 0.523 03/09/2012   FREET4 0.9 07/25/2016   FREET4 0.77 07/25/2014   FREET4 1.00 07/07/2014  TPO antibodies were normal: 1.  Reviewed previous investigation: She had a Thyroid scan in 2004: 1. NORMAL RIGHT LOBE OF THE THYROID. 2. THREE COLD NODULES OF THE LEFT LOBE.   She had a thyroid U/S (04/08/2003): 1. DOMINANT 3.3 CM IN DIAMETER COMPLEX MASS CONTAINING CYSTIC AND SOLID COMPONENTS INVOLVING THE MAJORITY OF THE MID AND LOWER POLE OF THE LEFT LOBE OF THYROID CORRESPONDING TO THE COLD NODULE SEEN IN THIS REGION ON NUCLEAR MEDICINE STUDY.  2. THERE IS A SECOND 1.6 CM SIZE COMPLEX NODULE ASSOCIATED IN THE LOWER POLE OF THE LEFT LOBE OF THYROID.  3.SMALLER (6 AND 8 MM IN SIZE) NODULES WITHIN THE MID RIGHT LOBE OF THE THYROID.  L Thyroid nodule FNA (05/10/2003):The morphologic features are most consistent with a colloid nodule.   Thyroid U/S at [facility] (05/29/2014):   Several R nodules:1.4 cm (heterogeneous) and 1.1 cm (hypoechoic), vascular, nodules and 3 subcm nodules  3 L nodules: 2.1 cm  (hypervascular), the rest smaller: 1.5 and 0.7 cm hypoechoic nodules.  Thyroid U/S (08/07/2016): Parenchymal Echotexture: Mildly heterogenous Isthmus: Measures 0.3 cm in the AP dimension. Right lobe: 5.3 x 1.6 x 1.7 cm Left lobe: 5.5 x 1.6 x 2.1 cm _________________________________________________________  Nodule # 1: Location: Right; Mid, pregnancy was increased from a maximum URI Maximum size: 1.3 cm; Other 2 dimensions: 0.8 x 0.9 cm Composition: solid/almost completely solid (2) Echogenicity: hypoechoic (2) ACR TI-RADS total points: 4. *Given size (>/= 1 - 1.4 cm) and appearance, a follow-up ultrasound in 1 year should be considered based on TI-RADS criteria.  Nodule # 2: Location: Right; Mid Maximum size: 1.5 cm; Other 2 dimensions: 0.8 x 1.0 cm Composition: solid/almost completely solid (2) Echogenicity: hypoechoic (2) Echogenic foci: punctate echogenic foci (3) ACR TI-RADS total points: 7. **Given size (>/= 1.0 cm) and appearance, fine needle aspiration of this highly suspicious nodule should be considered based on TI-RADS criteria.  Multiple additional small nodules throughout the right thyroid lobe. There are 2 cysts in the right inferior thyroid, largest measuring up to 0.7 cm. ______________________________________________________________  Dominant mildly hypoechoic solid nodule in the mid left thyroid lobe that measures 2.3 x 1.3 x 1.6 cm. By report, this previously measured 3.3 x 1.5 x 2.1 cm. This appears to represent the previously biopsied left thyroid nodule. Few small areas of cystic degeneration within this nodule. There are questionable echogenic foci throughout this nodule.  Nodule # 3: Location: Left; Inferior Maximum size: 1.5  cm; Other 2 dimensions: 0.7 x 1.1 cm. By report, there was a nodule in the inferior left thyroid lobe that measured 1.6 x 0.9 x 1.0 cm. Composition: solid/almost completely solid (2) Echogenicity: hypoechoic  (2) Shape: not taller-than-wide (0) Margins: lobulated/irregular (2) Echogenic foci: macrocalcifications (1) ACR TI-RADS total points: 7. **Given size (>/= 1.0 cm) and appearance, fine needle aspiration of this highly suspicious nodule should be considered based on TI-RADS criteria.  IMPRESSION: Multiple thyroid nodules.  The dominant nodule in left thyroid lobe measures up to 2.3 cm and appears to represent the previously biopsied nodule.  Nodule in the inferior left thyroid lobe (Nodule #3) and a nodule in the right mid thyroid lobe (Nodule #2) both meet criteria for biopsy.  Nodule #1 in the mid right thyroid lobe meets criteria for 1 year follow-up.    FNA of the dominant nodules (09/10/2017): Adequacy Reason UNSATISFACTORY for evaluation due to extremely scant cellularity. The specimen is processed and examined microscopically, but is found to be unsatisfactory for evaluation of an epithelial abnormality. Repeat study recommended. Diagnosis THYROID, FINE NEEDLE ASPIRATION LEFT LOWER POLE (SPECIMEN 1 OF 2 COLLECTED 09/10/2016) BLOOD. NO FOLLICULAR CELLULARITY PRESENT. Claudette Laws MD Pathologist, Electronic Signature (Case signed 09/11/2016) Specimen Clinical Information Previous Biopsy today and 05/11/2003), Nodule 3: Left Inferior 1.5 cm; Other 2 dimensions: 0.7 x 1.1 cm., By report there was nodule in the inferior left thyroid lobe that measured 1.6 x 0.9 x 1.0 cm., Hypoechoic, ACR TI- RADS total points:7, Highly suspicious nodule  Adequacy Reason Satisfactory For Evaluation. Diagnosis THYROID, FINE NEEDLE ASPIRATION, RMP (SPECIMEN 2 OF 2, COLLECTED 09/10/16): CONSISTENT WITH BENIGN FOLLICULAR NODULE (BETHESDA CATEGORY II). Claudette Laws MD Pathologist, Electronic Signature (Case signed 09/11/2016) Specimen Clinical Information Today and11/05/2003, Nodule 2: Right Mid 1.5 cm; Other 2 dimensions: 0.8 x 1.0cm solid /almost completely solid , Hypoechoic, ACR TI  - RADS total points: 7, Highly suspicious nodule Source Thyroid, Fine Needle Aspiration, Rt Lobe RMP, (Specimen 2of 2, collected on 09/10/16 )  Pt denies: - feeling nodules in neck - hoarseness - dysphagia - choking - SOB with lying down  Pt denies: - weight loss - tremors - palpitations - anxiety - hyperdefecation - hair loss But she does have hot flashes, which are chronic.  No FH of thyroid disease or of thyroid cancer. No h/o radiation tx to head or neck.  No seaweed or kelp. No recent contrast studies. No herbal supplements. No Biotin use. No recent steroids use.   She also has a history of HTN, GERD, DM2.  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  ROS: Constitutional: no weight gain/no weight loss, no fatigue, + hot flashes, no subjective hypothermia Eyes: no blurry vision, no xerophthalmia ENT: no sore throat, + see HPI Cardiovascular: no CP/no SOB/no palpitations/no leg swelling Respiratory: no cough/no SOB/no wheezing Gastrointestinal: no N/no V/no D/no C/+ acid reflux Musculoskeletal: no muscle aches/no joint aches Skin: no rashes, no hair loss Neurological: no tremors/no numbness/no tingling/no dizziness  I reviewed pt's medications, allergies, PMH, social hx, family hx:  Past Medical History:  Diagnosis Date  . Allergy   . Arthritis   . GERD (gastroesophageal reflux disease)   . Heart murmur   . Heart murmur 02/01/2013  . History of osteoarthritis    left knee  . Hypertension    under control with med., has been  on med. x 5 yr.  . Immature cataract    small   . KNEE PAIN, LEFT, CHRONIC 07/30/2009   Seen by [facility] ortho 03/31/12,DJD,  steroid injection   . Medial meniscus tear 09/2013   right knee  . Non-insulin dependent type 2 diabetes mellitus ([place])    off metformin- diet controlled per pt   . 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 Recurrent tonsillitis 12/01/2016  . Sinus headache 10/30/2015  . Wears partial dentures    upper   Past Surgical History:  Procedure Laterality Date  . COLONOSCOPY  2009   normal   . CYST EXCISION  03/25/2007   resection peritoneal inclusion cyst  . HERNIA REPAIR    . JOINT REPLACEMENT    . KNEE ARTHROSCOPY Left 2011  . KNEE ARTHROSCOPY Right 10/21/2013   Procedure: ARTHROSCOPY RIGHT KNEE;  Surgeon: Alta Corning, MD;  Location: [facility];  Service: Orthopedics;  Laterality: Right;  medial , lateral and patella femoral chondromalsia and medial plica  . LAPAROSCOPY N/A 04/21/2013   Procedure: LAPAROSCOPY DIAGNOSTIC;  Surgeon: Lavonia Drafts, MD;  Location: [facility];  Service: Gynecology;  Laterality: N/A;  . LAPAROTOMY N/A 04/21/2013   Procedure: LAPAROTOMY;  Surgeon: Lavonia Drafts, MD;  Location: [facility];  Service: Gynecology;  Laterality: N/A;  . LYSIS OF ADHESION  08/28/2005; 03/25/2007; 04/21/2013  . SALPINGOOPHORECTOMY Left 04/21/2013   Procedure: SALPINGO OOPHORECTOMY;  Surgeon: Lavonia Drafts, MD;  Location: [facility];  Service: Gynecology;  Laterality: Left;  . TOTAL ABDOMINAL HYSTERECTOMY  08/28/2005  . TOTAL KNEE ARTHROPLASTY Left 02/11/2013   Procedure: LEFT TOTAL KNEE ARTHROPLASTY;  Surgeon: Alta Corning, MD;  Location: [facility];  Service: Orthopedics;  Laterality: Left;  NO COMPUTER  . UMBILICAL HERNIA REPAIR  1984  . UNILATERAL SALPINGECTOMY Right 08/28/2005   History   Social History  . Marital Status: Married    Spouse Name: N/A    Number of Children: 4   Occupational History  .  Museum/gallery conservator    Social History Main Topics  . Smoking status: Never Smoker   . Smokeless tobacco: Never Used  . Alcohol Use: No  . Drug Use: No  . Sexual Activity: Yes    Birth Control/ Protection: Surgical   Social History Narrative   Lives with husband and 65 year old son.     Current Outpatient Medications on File Prior to Visit   Medication Sig Dispense Refill  . diclofenac sodium (VOLTAREN) 1 % GEL Apply 4 g to your back four times daily as needed for pain (Patient not taking: No sig reported) 100 g 0  . docusate sodium (COLACE) 100 MG capsule Take 200 mg by mouth daily. (Patient not taking: Reported on 08/21/2020)    . famotidine (PEPCID) 20 MG tablet Take 1 tablet (20 mg total) by mouth 2 (two) times daily.    . fluticasone (FLONASE) 50 MCG/ACT nasal spray SPRAY 2 SPRAYS INTO EACH NOSTRIL EVERY DAY 16 mL 6  . ibuprofen (ADVIL) 600 MG tablet Take 1 tablet (600 mg total) by mouth every 8 (eight) hours as needed. 30 tablet 0  . lisinopril (ZESTRIL) 5 MG tablet TAKE 1 TABLET BY MOUTH EVERY DAY 90 tablet 2  . loratadine (CLARITIN) 10 MG tablet Take 10 mg by mouth daily.    . Multiple Vitamin (MULTIVITAMIN) tablet Take 1 tablet by mouth daily. Womens Ultra Mega Vitamin    . Semaglutide, 1 MG/DOSE, (OZEMPIC, 1 MG/DOSE,) 4 MG/3ML SOPN Inject  1 mg into the skin once a week. 3 mL 2   No current facility-administered medications on file prior to visit.   Allergies  Allergen Reactions  . Iohexol Hives        . Ivp Dye [Iodinated Diagnostic Agents] Hives    Denies airway involvement  . Other Rash    COBAN   Family History  Problem Relation Age of Onset  . Diabetes Mother   . Hypertension Mother   . Hyperlipidemia Mother   . Colon polyps Mother   . Emphysema Father   . Breast cancer Cousin 84  . Colon polyps Sister   . Esophageal cancer Brother   . Colon cancer Neg Hx   . Rectal cancer Neg Hx   . Stomach cancer Neg Hx    PE: BP 128/82 (BP Location: Right Arm, Patient Position: Sitting, Cuff Size: Normal)   Pulse 73   Ht 5\' 7"  (1.702 m)   Wt 232 lb 3.2 oz (105.3 kg)   SpO2 99%   BMI 36.37 kg/m  Body mass index is 36.37 kg/m.  Wt Readings from Last 3 Encounters:  09/26/20 232 lb 3.2 oz (105.3 kg)  08/21/20 235 lb 4 oz (106.7 kg)  07/31/20 237 lb 12.8 oz (107.9 kg)   Constitutional: overweight, in  NAD Eyes: PERRLA, EOMI, no exophthalmos ENT: moist mucous membranes, + left thyroid fullness, no cervical lymphadenopathy Cardiovascular: RRR, No MRG Respiratory: CTA B Gastrointestinal: abdomen soft, NT, ND, BS+ Musculoskeletal: no deformities, strength intact in all 4 Skin: moist, warm, no rashes Neurological: no tremor with outstretched hands, DTR normal in all 4  ASSESSMENT: 1.  Subclinical thyrotoxicosis  2.  Multinodular goiter - 2004 FNA of the left dominant nodule: benign - 2018 FNA of them left lung dominant nodule: Benign - 2018 FNA of the right dominant nodule: Inconclusive due to unsatisfactory sample  PLAN:  1. Patient with history of mild subclinical thyrotoxicosis, with TFTs normalized at our last visit in 2018.  At that time, she appeared euthyroid that she had flashes, possible due to menopause.  -She now returns after 3 years absence.  Her recent TSH was being suppressed, and 0.19, with normal free T4 and free T3.  She denies thyrotoxic symptoms, but she does have chronic hot flashes -At this point, we will go ahead with a thyroid uptake and scan.  We discussed about the fact that her abnormal TFTs could be due to thyroiditis, Graves' disease, thyrotoxic adenomas. -Discussed about treatment for each of these to include methimazole, RAI treatment, and thyroidectomy as a last resort.  She would not want aggressive treatment for this.  She is not considering thyroidectomy. -At today's visit, we will check her TFTs and will add TSI antibodies to screen for Graves' disease.  2. Multinodular goiter -Patient with a long history of thyroid nodules, status post thyroid scan in 2004 that showed 3 left cold thyroid nodules.  She had an FNA of the dominant nodule in the left lobe, which at that time measured 3.3 cm and this was benign.  This nodule subsequently decreased in size with the largest dimension at 2.3 cm on the last ultrasound from 2018.  On this ultrasound, she had  another, smaller, nodule in the left lobe, which was hypoechoic and had microcalcifications.  A biopsy of this nodule in 2018 was benign.  In the right lobe, the 2018 ultrasound showed 2 nodules, 1 hypoechoic and low risk, with the recommendation to be just followed up without intervention, but  1 hypoechoic with microcalcifications with the recommendation for biopsy.  We biopsied the nodule in 2018 but the biopsy was inconclusive due to scant material.  At that time, I suggested rebiopsy in 6 months.  However, she was lost for follow-up afterwards. -At today's visit, she has no neck compression symptoms -We discussed about checking another thyroid ultrasound and proceeding with possibly repeat biopsy of the right inferior nodule.  However, I also would like to obtain a thyroid uptake and scan prior to the ultrasound to recheck on this nodule and make sure this is not producing hormones, since these nodules are usually at low risk for cancer and may not need a new biopsy. -She agrees with the plan -I will see her back in 4 months  After results of the thyroid uptake and scan are back, need to order a thyroid ultrasound ultrasound.  Component     Latest Ref Rng & Units 09/26/2020  TSH     0.35 - 4.50 uIU/mL 0.16 (L)  T4,Free(Direct)     0.60 - 1.60 ng/dL 0.81  Triiodothyronine,Free,Serum     2.3 - 4.2 pg/mL 2.9  TSI     <140 % baseline <89  TSH remains low while TSI's are undetectable.  We will go ahead with a thyroid uptake and scan as planned.   Philemon Kingdom, MD PhD [facility]

## 2020-10-01 LAB — THYROID STIMULATING IMMUNOGLOBULIN: TSI: <89 % baseline (ref <140)

## 2020-10-02 ENCOUNTER — Other Ambulatory Visit: Payer: Self-pay | Admitting: Family Medicine

## 2020-10-11 ENCOUNTER — Encounter: Payer: Self-pay | Admitting: Family Medicine

## 2020-10-11 MED ORDER — OZEMPIC (1 MG/DOSE) 4 MG/3ML ~~LOC~~ SOPN: 1.0000 mg | PEN_INJECTOR | SUBCUTANEOUS | 2 refills | Status: DC

## 2020-10-15 ENCOUNTER — Encounter: Payer: Self-pay | Admitting: Internal Medicine

## 2020-10-23 ENCOUNTER — Encounter (HOSPITAL_COMMUNITY): Payer: BC Managed Care – PPO

## 2020-10-23 ENCOUNTER — Ambulatory Visit (HOSPITAL_COMMUNITY): Admission: RE | Admit: 2020-10-23 | Payer: BC Managed Care – PPO | Source: Ambulatory Visit

## 2020-10-23 ENCOUNTER — Encounter (HOSPITAL_COMMUNITY): Admission: RE | Admit: 2020-10-23 | Payer: BC Managed Care – PPO | Source: Ambulatory Visit

## 2020-10-24 ENCOUNTER — Other Ambulatory Visit: Payer: Self-pay

## 2020-10-24 ENCOUNTER — Encounter (HOSPITAL_COMMUNITY): Payer: BC Managed Care – PPO

## 2020-10-24 ENCOUNTER — Encounter (HOSPITAL_COMMUNITY)
Admission: RE | Admit: 2020-10-24 | Discharge: 2020-10-24 | Disposition: A | Discharge status: Home / Self Care | Payer: BC Managed Care – PPO | Source: Ambulatory Visit | Attending: Internal Medicine | Admitting: Internal Medicine

## 2020-10-24 ENCOUNTER — Ambulatory Visit (HOSPITAL_COMMUNITY)
Admission: RE | Admit: 2020-10-24 | Discharge: 2020-10-24 | Disposition: A | Discharge status: Home / Self Care | Payer: BC Managed Care – PPO | Source: Ambulatory Visit | Attending: Internal Medicine | Admitting: Internal Medicine

## 2020-10-24 DIAGNOSIS — E042 Nontoxic multinodular goiter: Secondary | ICD-10-CM | POA: Insufficient documentation

## 2020-10-24 DIAGNOSIS — E0590 Thyrotoxicosis, unspecified without thyrotoxic crisis or storm: Secondary | ICD-10-CM | POA: Diagnosis not present

## 2020-10-24 MED ORDER — SODIUM IODIDE I-123 7.4 MBQ CAPS
465.0000 | ORAL_CAPSULE | Freq: Once | ORAL | Status: AC
  Administered 2020-10-24: 465 via ORAL

## 2020-10-25 ENCOUNTER — Encounter: Payer: Self-pay | Admitting: Internal Medicine

## 2020-10-25 ENCOUNTER — Other Ambulatory Visit: Payer: Self-pay | Admitting: Internal Medicine

## 2020-10-25 ENCOUNTER — Encounter (HOSPITAL_COMMUNITY)
Admission: RE | Admit: 2020-10-25 | Discharge: 2020-10-25 | Disposition: A | Discharge status: Home / Self Care | Payer: BC Managed Care – PPO | Source: Ambulatory Visit | Attending: Internal Medicine | Admitting: Internal Medicine

## 2020-10-25 DIAGNOSIS — E0590 Thyrotoxicosis, unspecified without thyrotoxic crisis or storm: Secondary | ICD-10-CM

## 2020-10-25 DIAGNOSIS — K5900 Constipation, unspecified: Secondary | ICD-10-CM | POA: Diagnosis not present

## 2020-10-25 DIAGNOSIS — E042 Nontoxic multinodular goiter: Secondary | ICD-10-CM | POA: Insufficient documentation

## 2020-10-25 DIAGNOSIS — E0510 Thyrotoxicosis with toxic single thyroid nodule without thyrotoxic crisis or storm: Secondary | ICD-10-CM

## 2020-10-30 DIAGNOSIS — M19072 Primary osteoarthritis, left ankle and foot: Secondary | ICD-10-CM | POA: Diagnosis not present

## 2020-10-30 DIAGNOSIS — M79672 Pain in left foot: Secondary | ICD-10-CM | POA: Diagnosis not present

## 2020-11-05 DIAGNOSIS — H18513 Endothelial corneal dystrophy, bilateral: Secondary | ICD-10-CM | POA: Insufficient documentation

## 2020-11-08 ENCOUNTER — Encounter: Payer: Self-pay | Admitting: Internal Medicine

## 2020-11-08 NOTE — Telephone Encounter (Signed)
I called and discussed with her about her recent thyroid uptake and scan results,  options for treatment, radioactive precautions after RAI treatment.  She has eye surgery coming up and will schedule her RAI treatment afterwards.

## 2020-11-15 DIAGNOSIS — H18511 Endothelial corneal dystrophy, right eye: Secondary | ICD-10-CM | POA: Diagnosis not present

## 2020-11-22 ENCOUNTER — Ambulatory Visit (HOSPITAL_COMMUNITY): Payer: BC Managed Care – PPO

## 2020-12-14 ENCOUNTER — Other Ambulatory Visit: Payer: Self-pay | Admitting: Family Medicine

## 2021-01-03 ENCOUNTER — Other Ambulatory Visit: Payer: Self-pay | Admitting: Family Medicine

## 2021-01-03 DIAGNOSIS — Z1231 Encounter for screening mammogram for malignant neoplasm of breast: Secondary | ICD-10-CM

## 2021-02-06 ENCOUNTER — Encounter: Payer: Self-pay | Admitting: Gastroenterology

## 2021-02-15 ENCOUNTER — Encounter: Payer: Self-pay | Admitting: Gastroenterology

## 2021-02-26 ENCOUNTER — Ambulatory Visit
Admission: RE | Admit: 2021-02-26 | Discharge: 2021-02-26 | Disposition: A | Discharge status: Home / Self Care | Payer: BC Managed Care – PPO | Source: Ambulatory Visit | Attending: Family Medicine | Admitting: Family Medicine

## 2021-02-26 ENCOUNTER — Other Ambulatory Visit: Payer: Self-pay

## 2021-02-26 DIAGNOSIS — Z1231 Encounter for screening mammogram for malignant neoplasm of breast: Secondary | ICD-10-CM | POA: Diagnosis not present

## 2021-03-06 DIAGNOSIS — H18512 Endothelial corneal dystrophy, left eye: Secondary | ICD-10-CM | POA: Diagnosis not present

## 2021-03-06 DIAGNOSIS — H40003 Preglaucoma, unspecified, bilateral: Secondary | ICD-10-CM | POA: Diagnosis not present

## 2021-03-06 DIAGNOSIS — Z947 Corneal transplant status: Secondary | ICD-10-CM | POA: Diagnosis not present

## 2021-03-06 DIAGNOSIS — Z961 Presence of intraocular lens: Secondary | ICD-10-CM | POA: Diagnosis not present

## 2021-03-13 ENCOUNTER — Telehealth: Payer: Self-pay | Admitting: Family Medicine

## 2021-03-13 NOTE — Telephone Encounter (Signed)
Letter received from [employer] regarding [place] prior authorization. I called her, and she confirmed that this was from her insurance. She picked her Ozempic up about a week ago and stated that she received a similar letter from [place]. She approved the online prior authorization request by [employer].  Letter left in the RN's box for RN to initiate prior auth process.

## 2021-03-15 NOTE — Telephone Encounter (Signed)
Received fax from pharmacy, PA needed on Ozempic '4mg'$ /3 mL.  Clinical questions submitted via Cover My Meds.  Waiting on response, could take up to 72 hours.  Cover My Meds info: Key:  [id number]  Talbot Grumbling, RN

## 2021-03-18 ENCOUNTER — Other Ambulatory Visit: Payer: Self-pay

## 2021-03-18 ENCOUNTER — Ambulatory Visit (AMBULATORY_SURGERY_CENTER): Payer: Self-pay

## 2021-03-18 VITALS — Ht 67.0 in | Wt 214.0 lb

## 2021-03-18 DIAGNOSIS — Z86010 Personal history of colonic polyps: Secondary | ICD-10-CM

## 2021-03-18 MED ORDER — PLENVU 140 G PO SOLR: 1.0000 | Freq: Once | ORAL | 0 refills | Status: AC

## 2021-03-18 NOTE — Progress Notes (Signed)
No egg or soy allergy known to patient  No issues known to pt with past sedation with any surgeries or procedures Patient denies ever being told they had issues or difficulty with intubation  No FH of Malignant Hyperthermia Pt is not on diet pills Pt is not on  home 02  Pt is not on blood thinners   Pt has issues with constipation, pt to start taking 1-2 colace bid to have a bm daily from now until the day of the procedure.  No A fib or A flutter  EMMI video to pt or via [place] 19 guidelines implemented in PV today with Pt and RN   Pt is fully vaccinated  for Covid   Plenvu  Coupon given to pt in PV today ,    NO PA's for preps discussed with pt In PV today  Discussed with pt there will be an out-of-pocket cost for prep and that varies from $0 to 70 +  dollars   Due to the COVID-19 pandemic we are asking patients to follow certain guidelines.  Pt aware of COVID protocols and [facility] guidelines

## 2021-03-21 NOTE — Telephone Encounter (Signed)
PA was denied on Ozempic. Insurance will fax letter of denial with next steps if you would like to appeal the decision.   Talbot Grumbling, RN

## 2021-03-22 ENCOUNTER — Encounter: Payer: Self-pay | Admitting: Gastroenterology

## 2021-03-25 ENCOUNTER — Encounter: Payer: Self-pay | Admitting: Family Medicine

## 2021-03-26 ENCOUNTER — Encounter: Payer: Self-pay | Admitting: Family Medicine

## 2021-03-26 ENCOUNTER — Other Ambulatory Visit: Payer: Self-pay

## 2021-03-26 ENCOUNTER — Ambulatory Visit (INDEPENDENT_AMBULATORY_CARE_PROVIDER_SITE_OTHER): Payer: BC Managed Care – PPO | Admitting: Family Medicine

## 2021-03-26 VITALS — BP 159/91 | HR 70 | Ht 67.0 in | Wt 216.4 lb

## 2021-03-26 DIAGNOSIS — I10 Essential (primary) hypertension: Secondary | ICD-10-CM

## 2021-03-26 DIAGNOSIS — R7309 Other abnormal glucose: Secondary | ICD-10-CM

## 2021-03-26 DIAGNOSIS — Z23 Encounter for immunization: Secondary | ICD-10-CM

## 2021-03-26 DIAGNOSIS — R739 Hyperglycemia, unspecified: Secondary | ICD-10-CM | POA: Diagnosis not present

## 2021-03-26 DIAGNOSIS — R7303 Prediabetes: Secondary | ICD-10-CM

## 2021-03-26 LAB — POCT GLYCOSYLATED HEMOGLOBIN (HGB A1C): HbA1c, POC (controlled diabetic range): 5.8 % (ref 0.0–7.0)

## 2021-03-26 MED ORDER — OZEMPIC (1 MG/DOSE) 4 MG/3ML ~~LOC~~ SOPN: 1.0000 mg | PEN_INJECTOR | SUBCUTANEOUS | 2 refills | Status: DC

## 2021-03-26 NOTE — Patient Instructions (Signed)
Zoster Vaccine, Recombinant injection What is this medication? ZOSTER VACCINE (ZOS ter vak SEEN) is a vaccine used to reduce the risk of getting shingles. This vaccine is not used to treat shingles or nerve pain from shingles. This medicine may be used for other purposes; ask your health care provider or pharmacist if you have questions. COMMON BRAND NAME(S): [facility] What should I tell my care team before I take this medication? They need to know if you have any of these conditions: cancer immune system problems an unusual or allergic reaction to Zoster vaccine, other medications, foods, dyes, or preservatives pregnant or trying to get pregnant breast-feeding How should I use this medication? This vaccine is injected into a muscle. It is given by a health care provider. A copy of Vaccine Information Statements will be given before each vaccination. Be sure to read this information carefully each time. This sheet may change often. Talk to your health care provider about the use of this vaccine in children. This vaccine is not approved for use in children. Overdosage: If you think you have taken too much of this medicine contact a poison control center or emergency room at once. NOTE: This medicine is only for you. Do not share this medicine with others. What if I miss a dose? Keep appointments for follow-up (booster) doses. It is important not to miss your dose. Call your health care provider if you are unable to keep an appointment. What may interact with this medication? medicines that suppress your immune system medicines to treat cancer steroid medicines like prednisone or cortisone This list may not describe all possible interactions. Give your health care provider a list of all the medicines, herbs, non-prescription drugs, or dietary supplements you use. Also tell them if you smoke, drink alcohol, or use illegal drugs. Some items may interact with your medicine. What should I watch for  while using this medication? Visit your health care provider regularly. This vaccine, like all vaccines, may not fully protect everyone. What side effects may I notice from receiving this medication? Side effects that you should report to your doctor or health care professional as soon as possible: allergic reactions (skin rash, itching or hives; swelling of the face, lips, or tongue) trouble breathing Side effects that usually do not require medical attention (report these to your doctor or health care professional if they continue or are bothersome): chills headache fever nausea pain, redness, or irritation at site where injected tiredness vomiting This list may not describe all possible side effects. Call your doctor for medical advice about side effects. You may report side effects to FDA at 1-800-FDA-1088. Where should I keep my medication? This vaccine is only given by a health care provider. It will not be stored at home. NOTE: This sheet is a summary. It may not cover all possible information. If you have questions about this medicine, talk to your doctor, pharmacist, or health care provider.  2022 Elsevier/Gold Standard (2019-07-22 16:23:07)

## 2021-03-26 NOTE — Assessment & Plan Note (Signed)
She has lost 35 lbs since Nov of 2021 when she started her Ozempic. Continue diet and exercise. I refilled her Ozempic and will reinitiate an appeal once she contacts her insurance.

## 2021-03-26 NOTE — Assessment & Plan Note (Signed)
A1C of 5.8 She will benefit from continuing her Ozempic given previous diabetic range of her A1C as well as hyperglycemia. I asked her to contact her insurance to initiate the denial appeal process. I will send in a refill of her Ozempic. She agreed with the plan.

## 2021-03-26 NOTE — Assessment & Plan Note (Signed)
Hx of hyperglycemia Improved on diet, exercise and Ozempic. Medication refilled.

## 2021-03-26 NOTE — Progress Notes (Signed)
    SUBJECTIVE:   CHIEF COMPLAINT / HPI:   PreDM/Weight Management:  She is here for a follow-up. Her A1C was in the diabetic range. However, with diet, exercise, and Oxempic, it remains in the pre-Diabetic range. Her recent Ozempic refill was denied. No other concerns.  HTN: Complit with Lisinopril 5 mg QD. She is yet to take her medication this morning.  HM: Her Colonoscopy appointment with GI has been scheduled. She needs Shingrix, Tdap, Flu shot.  PERTINENT  PMH / PSH: PMX reviewed  Vitals:   03/26/21 0831 03/26/21 0845  BP: 140/90 (!) 159/91  Pulse: 70   SpO2: 100%   Weight: 216 lb 6.4 oz (98.2 kg)   Height: 5\' 7"  (1.702 m)     Physical Exam Vitals and nursing note reviewed.  Cardiovascular:     Rate and Rhythm: Normal rate and regular rhythm.     Heart sounds: Normal heart sounds. No murmur heard. Pulmonary:     Effort: Pulmonary effort is normal. No respiratory distress.     Breath sounds: Normal breath sounds.  Abdominal:     General: Abdomen is flat. There is no distension.     Palpations: Abdomen is soft. There is no mass.     Tenderness: There is no abdominal tenderness.   Diabetic Foot Check -  Appearance - no lesions, ulcers or calluses Skin - no unusual pallor or redness Monofilament testing -  Right - Great toe, medial, central, lateral ball and posterior foot intact Left - Great toe, medial, central, lateral ball and posterior foot intact   ASSESSMENT/PLAN:   Prediabetes A1C of 5.8 She will benefit from continuing her Ozempic given previous diabetic range of her A1C as well as hyperglycemia. I asked her to contact her insurance to initiate the denial appeal process. I will send in a refill of her Ozempic. She agreed with the plan.  Hyperglycemia Hx of hyperglycemia Improved on diet, exercise and Ozempic. Medication refilled.   HYPERTENSION, BENIGN SYSTEMIC BP slightly elevated. Repeat BP was checked while she was about to take her flu  shot. She then stated that she did not take her medications today. Plan is to continue her current dose of Lisinopril for now. I will reassess at her next appointment. Continue home BP monitoring for now.   Morbid Obesity She has lost 35 lbs since Nov of 2021 when she started her Ozempic. Continue diet and exercise. I refilled her Ozempic and will reinitiate an appeal once she contacts her insurance.  Health maintenance: Colonoscopy appointment scheduled She declined Tdap and Shingrix (counseling done) Flu shot given today.  Andrena Mews, MD [place]

## 2021-03-26 NOTE — Assessment & Plan Note (Signed)
BP slightly elevated. Repeat BP was checked while she was about to take her flu shot. She then stated that she did not take her medications today. Plan is to continue her current dose of Lisinopril for now. I will reassess at her next appointment. Continue home BP monitoring for now.

## 2021-04-01 ENCOUNTER — Ambulatory Visit: Payer: BC Managed Care – PPO | Admitting: Internal Medicine

## 2021-04-01 NOTE — Progress Notes (Deleted)
Patient ID: Cynthia Wilson, female   DOB: 04/11/1966, 55 y.o.   MRN: [id number]  This visit occurred during the SARS-CoV-2 public health emergency.  Safety protocols were in place, including screening questions prior to the visit, additional usage of staff PPE, and extensive cleaning of exam room while observing appropriate contact time as indicated for disinfecting solutions.   HPI  Cynthia Wilson is a 55 y.o.-year-old female, referred by her PCP, Dr. Gwendlyn Deutscher, in consultation for subclinical thyrotoxicosis and goiter.  Last visit 6 months ago.  Interim history: Since last visit, she did not go through with the RAI treatment.  Reviewed and addended history: She was diagnosed with a multinodular goiter in 2004.  I reviewed her TFTs:: Lab Results  Component Value Date   TSH 0.16 (L) 09/26/2020   TSH 0.192 (L) 07/31/2020   TSH 0.496 04/28/2018   TSH 0.22 (L) 07/25/2016   TSH 0.69 07/25/2014   TSH 0.268 (L) 07/07/2014   TSH 0.523 03/09/2012   FREET4 0.81 09/26/2020   FREET4 0.9 07/25/2016   FREET4 0.77 07/25/2014   FREET4 1.00 07/07/2014  TPO antibodies were normal: 1.  Graves' antibodies were not elevated: Lab Results  Component Value Date   TSI <89 09/26/2020   Previous investigation: She had a Thyroid scan in 2004: 1.  NORMAL RIGHT LOBE OF THE THYROID. 2.  THREE COLD NODULES OF THE LEFT LOBE.    She had a thyroid U/S (04/08/2003): 1. DOMINANT 3.3 CM IN DIAMETER COMPLEX MASS CONTAINING CYSTIC AND SOLID COMPONENTS INVOLVING THE MAJORITY OF THE MID AND LOWER POLE OF THE LEFT LOBE OF THYROID CORRESPONDING TO THE COLD NODULE SEEN IN THIS REGION ON NUCLEAR MEDICINE STUDY.   2. THERE IS A SECOND 1.6 CM SIZE COMPLEX NODULE ASSOCIATED IN THE LOWER POLE OF THE LEFT LOBE OF THYROID.  3. SMALLER (6 AND 8 MM IN SIZE) NODULES WITHIN THE MID RIGHT LOBE OF THE THYROID.  L Thyroid nodule FNA (05/10/2003): The morphologic features are most consistent with a colloid nodule.    Thyroid U/S at [facility] (05/29/2014):  Several R nodules:1.4 cm (heterogeneous) and 1.1 cm (hypoechoic), vascular, nodules and 3 subcm nodules 3 L nodules: 2.1 cm (hypervascular), the rest smaller: 1.5 and 0.7 cm hypoechoic nodules.  Thyroid U/S (08/07/2016): Parenchymal Echotexture: Mildly heterogenous Isthmus: Measures 0.3 cm in the AP dimension. Right lobe: 5.3 x 1.6 x 1.7 cm Left lobe: 5.5 x 1.6 x 2.1 cm _________________________________________________________    Nodule # 1: Location: Right; Mid, pregnancy was increased from a maximum URI Maximum size: 1.3 cm; Other 2 dimensions: 0.8 x 0.9 cm Composition: solid/almost completely solid (2)  Echogenicity: hypoechoic (2) ACR TI-RADS total points: 4. *Given size (>/= 1 - 1.4 cm) and appearance, a follow-up ultrasound in 1 year should be considered based on TI-RADS criteria.   Nodule # 2: Location: Right; Mid Maximum size: 1.5 cm; Other 2 dimensions: 0.8 x 1.0 cm Composition: solid/almost completely solid (2)  Echogenicity: hypoechoic (2) Echogenic foci: punctate echogenic foci (3) ACR TI-RADS total points: 7.  **Given size (>/= 1.0 cm) and appearance, fine needle aspiration of this highly suspicious nodule should be considered based on TI-RADS criteria.   Multiple additional small nodules throughout the right thyroid lobe. There are 2 cysts in the right inferior thyroid, largest measuring up to 0.7 cm. ______________________________________________________________   Dominant mildly hypoechoic solid nodule in the mid left thyroid lobe that measures 2.3 x 1.3 x 1.6 cm. By report, this previously measured  3.3 x 1.5 x 2.1 cm. This appears to represent the previously biopsied left thyroid nodule. Few small areas of cystic degeneration within this nodule. There are questionable echogenic foci throughout this nodule.   Nodule # 3: Location: Left; Inferior Maximum size: 1.5 cm; Other 2 dimensions: 0.7 x 1.1 cm. By  report, there was a nodule in the inferior left thyroid lobe that measured 1.6 x 0.9 x 1.0 cm. Composition: solid/almost completely solid (2) Echogenicity: hypoechoic (2) Shape: not taller-than-wide (0) Margins: lobulated/irregular (2) Echogenic foci: macrocalcifications (1) ACR TI-RADS total points: 7.  **Given size (>/= 1.0 cm) and appearance, fine needle aspiration of this highly suspicious nodule should be considered based on TI-RADS criteria.   IMPRESSION: Multiple thyroid nodules.   The dominant nodule in left thyroid lobe measures up to 2.3 cm and appears to represent the previously biopsied nodule.   Nodule in the inferior left thyroid lobe (Nodule #3) and a nodule in the right mid thyroid lobe (Nodule #2) both meet criteria for biopsy.   Nodule #1 in the mid right thyroid lobe meets criteria for 1 year follow-up.    FNA of the dominant nodules (09/10/2017): Adequacy Reason UNSATISFACTORY for evaluation due to extremely scant cellularity. The specimen is processed and examined microscopically, but is found to be unsatisfactory for evaluation of an epithelial abnormality. Repeat study recommended. Diagnosis THYROID, FINE NEEDLE ASPIRATION LEFT LOWER POLE (SPECIMEN 1 OF 2 COLLECTED 09/10/2016) BLOOD. NO FOLLICULAR CELLULARITY PRESENT. Claudette Laws MD Pathologist, Electronic Signature (Case signed 09/11/2016) Specimen Clinical Information Previous Biopsy today and 05/11/2003), Nodule 3: Left Inferior 1.5 cm; Other 2 dimensions: 0.7 x 1.1 cm., By report there was nodule in the inferior left thyroid lobe that measured 1.6 x 0.9 x 1.0 cm., Hypoechoic, ACR TI- RADS total points:7, Highly suspicious nodule  Adequacy Reason Satisfactory For Evaluation. Diagnosis THYROID, FINE NEEDLE ASPIRATION, RMP (SPECIMEN 2 OF 2, COLLECTED 09/10/16): CONSISTENT WITH BENIGN FOLLICULAR NODULE (BETHESDA CATEGORY II). Claudette Laws MD Pathologist, Electronic Signature (Case signed  09/11/2016) Specimen Clinical Information Today and11/05/2003, Nodule 2: Right Mid 1.5 cm; Other 2 dimensions: 0.8 x 1.0cm solid /almost completely solid , Hypoechoic, ACR TI - RADS total points: 7, Highly suspicious nodule Source Thyroid, Fine Needle Aspiration, Rt Lobe RMP, (Specimen 2of 2, collected on 09/10/16 )  Thyroid uptake and scan (10/25/2020): Low uptake in left hot nodule versus toxic multinodular goiter  Pt denies: - feeling nodules in neck - hoarseness - dysphagia - choking - SOB with lying down  Pt denies: - weight loss - tremors - palpitations - anxiety - hyperdefecation - hair loss But she does have hot flashes, which are chronic.  No FH of thyroid disease or of thyroid cancer. No h/o radiation tx to head or neck.  No seaweed or kelp. No recent contrast studies. No herbal supplements. No Biotin use. No recent steroids use.   She also has a history of HTN, GERD, DM2.  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  ROS: Constitutional: no weight gain/no weight loss, no fatigue, + hot flashes, no subjective hypothermia Eyes: no blurry vision, no xerophthalmia ENT: no sore throat, + see HPI Cardiovascular: no CP/no SOB/no palpitations/no leg swelling Respiratory: no cough/no SOB/no wheezing Gastrointestinal: no N/no V/no D/no C/+ acid reflux Musculoskeletal: no muscle aches/no joint aches Skin: no rashes, no hair loss Neurological: no tremors/no numbness/no tingling/no dizziness  I reviewed pt's medications, allergies, PMH, social hx, family hx:  Past Medical History:  Diagnosis Date   Allergy    Arthritis    GERD (gastroesophageal reflux disease)    Heart murmur    Heart murmur 02/01/2013   History of osteoarthritis    left knee   Hypertension    under control with med., has been on med. x 5 yr.   Immature cataract 2021   removed bilaterally    KNEE PAIN, LEFT, CHRONIC 07/30/2009   Seen by [facility] ortho 03/31/12,DJD,  steroid injection    Medial meniscus tear 09/2013   right knee   Non-insulin dependent type 2 diabetes mellitus ([place])    off metformin- diet controlled per pt    Pelvic peritoneal adhesions, female 04/21/2013   Recurrent tonsillitis 12/01/2016   Sinus headache 10/30/2015   Wears partial dentures    upper   Past Surgical History:  Procedure Laterality Date   CATARACT EXTRACTION Right 01/2020   CATARACT EXTRACTION Left 11/2019   COLONOSCOPY  2009   normal    CORNEAL TRANSPLANT Right 2022   CYST EXCISION  03/25/2007   resection peritoneal inclusion cyst   HERNIA REPAIR     JOINT REPLACEMENT Left    knee   KNEE ARTHROSCOPY Left 2011   KNEE ARTHROSCOPY Right 10/21/2013   Procedure: ARTHROSCOPY RIGHT KNEE;  Surgeon: Alta Corning, MD;  Location: [facility];  Service: Orthopedics;  Laterality: Right;  medial , lateral and patella femoral chondromalsia and medial plica   LAPAROSCOPY N/A 04/21/2013   Procedure: LAPAROSCOPY DIAGNOSTIC;  Surgeon: Lavonia Drafts, MD;  Location: [facility];  Service: Gynecology;  Laterality: N/A;   LAPAROTOMY N/A 04/21/2013   Procedure: LAPAROTOMY;  Surgeon: Lavonia Drafts, MD;  Location: [facility];  Service: Gynecology;  Laterality: N/A;   LYSIS OF ADHESION  08/28/2005; 03/25/2007; 04/21/2013   POLYPECTOMY     SALPINGOOPHORECTOMY Left 04/21/2013   Procedure: SALPINGO OOPHORECTOMY;  Surgeon: Lavonia Drafts, MD;  Location: [facility];  Service: Gynecology;  Laterality: Left;   TOTAL ABDOMINAL HYSTERECTOMY  08/28/2005   TOTAL KNEE ARTHROPLASTY Left 02/11/2013   Procedure: LEFT TOTAL KNEE ARTHROPLASTY;  Surgeon: Alta Corning, MD;  Location: [facility];  Service: Orthopedics;  Laterality: Left;  NO COMPUTER   UMBILICAL HERNIA REPAIR  1984   UNILATERAL SALPINGECTOMY Right 08/28/2005   History   Social History   Marital Status: Married    Spouse Name: N/A     Number of Children: 4   Occupational History    Museum/gallery conservator    Social History Main Topics   Smoking status: Never Smoker    Smokeless tobacco: Never Used   Alcohol Use: No   Drug Use: No   Sexual Activity: Yes    Birth Control/ Protection: Surgical   Social History Narrative   Lives with husband and 4 year old son.     Current Outpatient Medications on File Prior to Visit  Medication Sig Dispense Refill   acetaminophen (TYLENOL) 500 MG tablet Take 500 mg by mouth every 6 (six) hours as needed. (Patient not taking: Reported on 03/26/2021)     diclofenac sodium (VOLTAREN) 1 % GEL Apply 4 g to your back four times daily as needed for pain (Patient not taking: Reported on 03/26/2021) 100 g 0   docusate sodium (COLACE) 100 MG capsule Take 200 mg by mouth daily. (Patient not taking: Reported on 03/26/2021)     famotidine (PEPCID) 20 MG tablet Take 1 tablet (  20 mg total) by mouth 2 (two) times daily. (Patient not taking: Reported on 03/26/2021)     fluticasone (FLONASE) 50 MCG/ACT nasal spray SPRAY 2 SPRAYS INTO EACH NOSTRIL EVERY DAY (Patient not taking: Reported on 03/26/2021) 16 mL 6   ibuprofen (ADVIL) 600 MG tablet Take 1 tablet (600 mg total) by mouth every 8 (eight) hours as needed. (Patient not taking: Reported on 03/26/2021) 30 tablet 0   lisinopril (ZESTRIL) 5 MG tablet TAKE 1 TABLET BY MOUTH EVERY DAY 90 tablet 1   loratadine (CLARITIN) 10 MG tablet Take 10 mg by mouth daily.     Multiple Vitamin (MULTIVITAMIN) tablet Take 1 tablet by mouth daily. Womens Ultra Mega Vitamin     Semaglutide, 1 MG/DOSE, (OZEMPIC, 1 MG/DOSE,) 4 MG/3ML SOPN Inject 1 mg into the skin once a week. 3 mL 2   No current facility-administered medications on file prior to visit.   Allergies  Allergen Reactions   Iohexol Hives         Ivp Dye [Iodinated Diagnostic Agents] Hives    Denies airway involvement   Other Rash    COBAN   Family History  Problem Relation Age of Onset   Diabetes Mother     Hypertension Mother    Hyperlipidemia Mother    Colon polyps Mother    Emphysema Father    Breast cancer Cousin 82   Colon polyps Sister    Esophageal cancer Brother    Colon cancer Neg Hx    Rectal cancer Neg Hx    Stomach cancer Neg Hx    PE: There were no vitals taken for this visit. There is no height or weight on file to calculate BMI.  Wt Readings from Last 3 Encounters:  03/26/21 216 lb 6.4 oz (98.2 kg)  03/18/21 214 lb (97.1 kg)  09/26/20 232 lb 3.2 oz (105.3 kg)   Constitutional: overweight, in NAD Eyes: PERRLA, EOMI, no exophthalmos ENT: moist mucous membranes, + left thyroid fullness, no cervical lymphadenopathy Cardiovascular: RRR, No MRG Respiratory: CTA B Gastrointestinal: abdomen soft, NT, ND, BS+ Musculoskeletal: no deformities, strength intact in all 4 Skin: moist, warm, no rashes Neurological: no tremor with outstretched hands, DTR normal in all 4  ASSESSMENT: 1.  Subclinical thyrotoxicosis  2.  Multinodular goiter - 2004 FNA of the left dominant nodule: benign - 2018 FNA of them left lung dominant nodule: Benign - 2018 FNA of the right dominant nodule: Inconclusive due to unsatisfactory sample  PLAN:  1. 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 -At last visit she returned after 3 years of absence.  Her most recent TSH was suppressed at 0.19, with normal free T4 and free T3.  She did not have thyrotoxic symptoms but she continues to experience hot flashes.  At that time, we repeated her TFTs and the TSH was slightly lower, at 0.16.  TSI antibodies are undetectable. -I suggested thyroid uptake and scan first and RAI treatment afterwards.  She had the thyroid uptake and scan on 10/25/2020.  That showed low uptake with a left hot nodule versus toxic multinodular goiter.  -At that time, I ordered radioactive iodine treatment but she did not proceed with this.  Of note, she would not want to consider  thyroidectomy. -At this visit, we will recheck her TFTs  2. Multinodular goiter -Patient with a long history of thyroid nodules, status post thyroid scan in 2004 that showed 3 "thyroid nodules.  She had been FNA  of the dominant nodule in the left lobe, which at that time measured 3.3 cm and was benign.  This nodule subsequently decreased in size with the largest dimension at 2.3 cm on the ultrasound from 2018.  On this ultrasound, she had another, smaller, nodule, in the left lobe, which was hypoechoic and had microcalcifications.  A biopsy of this nodule in 2018 was benign.  In the right lobe, the 2018 ultrasound showed 2 nodules: 1 hypoechoic and low risk with a recommendation to be just followed up without intervention, but 1 hypoechoic with microcalcifications with the recommendation for biopsy.  We biopsied the nodule in 2018 but the biopsy was inconclusive due to scant material.  At that time, I suggested rebiopsy in 6 months but she was lost for follow-up afterwards.  At last visit we decided to proceed with another thyroid ultrasound after the results of the uptake and scan were back.  When the results are back, I suggested RAI treatment, but she did not go through with this, as mentioned above. -At today's visit, she has no neck compression symptoms -We decided to recheck her thyroid ultrasound now -We will recheck her TFTs now  Philemon Kingdom, MD PhD [facility] Endocrinology

## 2021-04-02 ENCOUNTER — Encounter: Payer: Self-pay | Admitting: Gastroenterology

## 2021-04-02 ENCOUNTER — Other Ambulatory Visit: Payer: Self-pay

## 2021-04-02 ENCOUNTER — Ambulatory Visit (AMBULATORY_SURGERY_CENTER): Payer: BC Managed Care – PPO | Admitting: Gastroenterology

## 2021-04-02 VITALS — BP 161/85 | HR 62 | Temp 97.6°F | Resp 12 | Ht 67.0 in | Wt 214.0 lb

## 2021-04-02 DIAGNOSIS — D123 Benign neoplasm of transverse colon: Secondary | ICD-10-CM | POA: Diagnosis not present

## 2021-04-02 DIAGNOSIS — Z1211 Encounter for screening for malignant neoplasm of colon: Secondary | ICD-10-CM | POA: Diagnosis not present

## 2021-04-02 DIAGNOSIS — K635 Polyp of colon: Secondary | ICD-10-CM

## 2021-04-02 DIAGNOSIS — D122 Benign neoplasm of ascending colon: Secondary | ICD-10-CM

## 2021-04-02 DIAGNOSIS — Z86010 Personal history of colonic polyps: Secondary | ICD-10-CM

## 2021-04-02 MED ORDER — SODIUM CHLORIDE 0.9 % IV SOLN: 500.0000 mL | INTRAVENOUS | Status: DC

## 2021-04-02 NOTE — Patient Instructions (Signed)
Read all of the handouts given to you by your recovery room nurse.  YOU HAD AN ENDOSCOPIC PROCEDURE TODAY AT THE [facility]:   Refer to the procedure report that was given to you for any specific questions about what was found during the examination.  If the procedure report does not answer your questions, please call your gastroenterologist to clarify.  If you requested that your care partner not be given the details of your procedure findings, then the procedure report has been included in a sealed envelope for you to review at your convenience later.  YOU SHOULD EXPECT: Some feelings of bloating in the abdomen. Passage of more gas than usual.  Walking can help get rid of the air that was put into your GI tract during the procedure and reduce the bloating. If you had a lower endoscopy (such as a colonoscopy or flexible sigmoidoscopy) you may notice spotting of blood in your stool or on the toilet paper. If you underwent a bowel prep for your procedure, you may not have a normal bowel movement for a few days.  Please Note:  You might notice some irritation and congestion in your nose or some drainage.  This is from the oxygen used during your procedure.  There is no need for concern and it should clear up in a day or so.  SYMPTOMS TO REPORT IMMEDIATELY:  Following lower endoscopy (colonoscopy or flexible sigmoidoscopy):  Excessive amounts of blood in the stool  Significant tenderness or worsening of abdominal pains  Swelling of the abdomen that is new, acute  Fever of 100F or higher   For urgent or emergent issues, a gastroenterologist can be reached at any hour by calling [phone]. Do not use MyChart messaging for urgent concerns.    DIET:  We do recommend a small meal at first, but then you may proceed to your regular diet.  Drink plenty of fluids but you should avoid alcoholic beverages for 24 hours. Try to increase the fiber in your diet, and drink plenty of  water.  ACTIVITY:  You should plan to take it easy for the rest of today and you should NOT DRIVE or use heavy machinery until tomorrow (because of the sedation medicines used during the test).    FOLLOW UP: Our staff will call the number listed on your records 48-72 hours following your procedure to check on you and address any questions or concerns that you may have regarding the information given to you following your procedure. If we do not reach you, we will leave a message.  We will attempt to reach you two times.  During this call, we will ask if you have developed any symptoms of COVID 19. If you develop any symptoms (ie: fever, flu-like symptoms, shortness of breath, cough etc.) before then, please call [phone].  If you test positive for Covid 19 in the 2 weeks post procedure, please call and report this information to us.    If any biopsies were taken you will be contacted by phone or by letter within the next 1-3 weeks.  Please call us at [phone] if you have not heard about the biopsies in 3 weeks.    SIGNATURES/CONFIDENTIALITY: You and/or your care partner have signed paperwork which will be entered into your electronic medical record.  These signatures attest to the fact that that the information above on your After Visit Summary has been reviewed and is understood.  Full responsibility of the confidentiality of this   discharge information lies with you and/or your care-partner.  

## 2021-04-02 NOTE — Op Note (Signed)
[facility] Patient Name: Cynthia Wilson Procedure Date: 04/02/2021 8:25 AM MRN: [id number] Endoscopist: Mallie Mussel L. Loletha Carrow , MD Age: 55 Referring MD:  Date of Birth: 09-28-1965 Gender: Female Account #: [id number] Procedure:                Colonoscopy Indications:              Increased risk colon cancer surveillance: Personal                            history of sessile serrated colon polyp (8-74mm in                            July 2019) Medicines:                Monitored Anesthesia Care Procedure:                Pre-Anesthesia Assessment:                           - Prior to the procedure, a History and Physical                            was performed, and patient medications and                            allergies were reviewed. The patient's tolerance of                            previous anesthesia was also reviewed. The risks                            and benefits of the procedure and the sedation                            options and risks were discussed with the patient.                            All questions were answered, and informed consent                            was obtained. Prior Anticoagulants: The patient has                            taken no previous anticoagulant or antiplatelet                            agents. ASA Grade Assessment: II - A patient with                            mild systemic disease. After reviewing the risks                            and benefits, the patient was deemed in  satisfactory condition to undergo the procedure.                           After obtaining informed consent, the colonoscope                            was passed under direct vision. Throughout the                            procedure, the patient's blood pressure, pulse, and                            oxygen saturations were monitored continuously. The                            CF [id number] #[id number] was introduced through the  anus                            and advanced to the the cecum, identified by                            appendiceal orifice and ileocecal valve. The                            colonoscopy was performed without difficulty. The                            patient tolerated the procedure well. The quality                            of the bowel preparation was initially fair, then                            improved to good with lavage. The ileocecal valve,                            appendiceal orifice, and rectum were photographed.                            The bowel preparation used was Plenvu. Scope In: 8:48:53 AM Scope Out: 9:06:12 AM Scope Withdrawal Time: 0 hours 13 minutes 7 seconds  Total Procedure Duration: 0 hours 17 minutes 19 seconds  Findings:                 The perianal and digital rectal examinations were                            normal.                           A 5 mm polyp was found in the proximal ascending                            colon. The polyp was flat. The polyp was removed  with a piecemeal technique using a cold biopsy                            forceps. Resection and retrieval were complete.                           A 5 mm polyp was found in the proximal transverse                            colon. The polyp was sessile. The polyp was removed                            with a cold snare. Resection and retrieval were                            complete.                           Multiple small-mouthed diverticula were found in                            the left colon.                           The exam was otherwise without abnormality on                            direct and retroflexion views. Complications:            No immediate complications. Estimated Blood Loss:     Estimated blood loss was minimal. Impression:               - One 5 mm polyp in the proximal ascending colon,                            removed piecemeal  using a cold biopsy forceps.                            Resected and retrieved.                           - One 5 mm polyp in the proximal transverse colon,                            removed with a cold snare. Resected and retrieved.                           - Diverticulosis in the left colon.                           - The examination was otherwise normal on direct                            and retroflexion views. Recommendation:           - Patient has  a contact number available for                            emergencies. The signs and symptoms of potential                            delayed complications were discussed with the                            patient. Return to normal activities tomorrow.                            Written discharge instructions were provided to the                            patient.                           - Resume previous diet.                           - Continue present medications.                           - Await pathology results.                           - Repeat colonoscopy is recommended for                            surveillance. The colonoscopy date will be                            determined after pathology results from today's                            exam become available for review. 2-day prep for                            next exam                           - Return to my office at appointment to be                            scheduled to discuss chronic constipation. Ruthella Kirchman L. Loletha Carrow, MD 04/02/2021 9:13:52 AM This report has been signed electronically.

## 2021-04-02 NOTE — Progress Notes (Signed)
6438: PT removed from monitors to use restroom

## 2021-04-02 NOTE — Progress Notes (Signed)
PT taken to PACU. Monitors in place. VSS. Report given to RN. 

## 2021-04-02 NOTE — Progress Notes (Signed)
Called to room to assist during endoscopic procedure.  Patient ID and intended procedure confirmed with present staff. Received instructions for my participation in the procedure from the performing physician.  

## 2021-04-02 NOTE — Progress Notes (Signed)
History and Physical:  This patient presents for endoscopic testing for: Encounter Diagnosis  Name Primary?   Personal history of colonic polyps Yes    Patient denies chest pain or dyspnea. Hx colon polyp    Past Medical History: Past Medical History:  Diagnosis Date   Allergy    Arthritis    GERD (gastroesophageal reflux disease)    Heart murmur    Heart murmur 02/01/2013   History of osteoarthritis    left knee   Hypertension    under control with med., has been on med. x 5 yr.   Immature cataract 2021   removed bilaterally   KNEE PAIN, LEFT, CHRONIC 07/30/2009   Seen by [facility] 03/31/12,DJD,  steroid injection    Medial meniscus tear 09/2013   right knee   Non-insulin dependent type 2 diabetes mellitus ([place])    off metformin- diet controlled per pt    Pelvic peritoneal adhesions, female 04/21/2013   Recurrent tonsillitis 12/01/2016   Sinus headache 10/30/2015   Wears partial dentures    upper     Past Surgical History: Past Surgical History:  Procedure Laterality Date   CATARACT EXTRACTION Right 01/2020   CATARACT EXTRACTION Left 11/2019   COLONOSCOPY  2009   normal    CORNEAL TRANSPLANT Right 2022   CYST EXCISION  03/25/2007   resection peritoneal inclusion cyst   HERNIA REPAIR     JOINT REPLACEMENT Left    knee   KNEE ARTHROSCOPY Left 2011   KNEE ARTHROSCOPY Right 10/21/2013   Procedure: ARTHROSCOPY RIGHT KNEE;  Surgeon: Alta Corning, MD;  Location: [facility];  Service: Orthopedics;  Laterality: Right;  medial , lateral and patella femoral chondromalsia and medial plica   LAPAROSCOPY N/A 04/21/2013   Procedure: LAPAROSCOPY DIAGNOSTIC;  Surgeon: Lavonia Drafts, MD;  Location: [facility];  Service: Gynecology;  Laterality: N/A;   LAPAROTOMY N/A 04/21/2013   Procedure: LAPAROTOMY;  Surgeon: Lavonia Drafts, MD;  Location: [facility];  Service: Gynecology;  Laterality: N/A;   LYSIS OF ADHESION  08/28/2005; 03/25/2007; 04/21/2013    POLYPECTOMY     SALPINGOOPHORECTOMY Left 04/21/2013   Procedure: SALPINGO OOPHORECTOMY;  Surgeon: Lavonia Drafts, MD;  Location: [facility];  Service: Gynecology;  Laterality: Left;   TOTAL ABDOMINAL HYSTERECTOMY  08/28/2005   TOTAL KNEE ARTHROPLASTY Left 02/11/2013   Procedure: LEFT TOTAL KNEE ARTHROPLASTY;  Surgeon: Alta Corning, MD;  Location: [facility];  Service: Orthopedics;  Laterality: Left;  NO COMPUTER   UMBILICAL HERNIA REPAIR  1984   UNILATERAL SALPINGECTOMY Right 08/28/2005    Allergies: Allergies  Allergen Reactions   Iohexol Hives         Ivp Dye [Iodinated Diagnostic Agents] Hives    Denies airway involvement   Other Rash    COBAN    Outpatient Meds: Current Outpatient Medications  Medication Sig Dispense Refill   lisinopril (ZESTRIL) 5 MG tablet TAKE 1 TABLET BY MOUTH EVERY DAY 90 tablet 1   loratadine (CLARITIN) 10 MG tablet Take 10 mg by mouth daily.     Multiple Vitamin (MULTIVITAMIN) tablet Take 1 tablet by mouth daily. Womens Ultra Mega Vitamin     acetaminophen (TYLENOL) 500 MG tablet Take 500 mg by mouth every 6 (six) hours as needed. (Patient not taking: No sig reported)     diclofenac sodium (VOLTAREN) 1 % GEL Apply 4 g to your back four times daily as needed for pain (Patient not taking: No sig reported) 100 g 0  docusate sodium (COLACE) 100 MG capsule Take 200 mg by mouth daily. (Patient not taking: No sig reported)     famotidine (PEPCID) 20 MG tablet Take 1 tablet (20 mg total) by mouth 2 (two) times daily.     fluticasone (FLONASE) 50 MCG/ACT nasal spray SPRAY 2 SPRAYS INTO EACH NOSTRIL EVERY DAY (Patient not taking: No sig reported) 16 mL 6   ibuprofen (ADVIL) 600 MG tablet Take 1 tablet (600 mg total) by mouth every 8 (eight) hours as needed. (Patient not taking: No sig reported) 30 tablet 0   Semaglutide, 1 MG/DOSE, (OZEMPIC, 1 MG/DOSE,) 4 MG/3ML SOPN Inject 1 mg into the skin once a week. 3 mL 2   Current Facility-Administered Medications   Medication Dose Route Frequency Provider Last Rate Last Admin   0.9 %  sodium chloride infusion  500 mL Intravenous Continuous Nelida Meuse III, MD          ___________________________________________________________________ Objective   Exam:  BP (!) 156/91   Pulse 64   Temp 97.6 F (36.4 C)   Resp (!) 21   Ht 5\' 7"  (1.702 m)   Wt 214 lb (97.1 kg)   SpO2 100%   BMI 33.52 kg/m   CV: RRR without murmur, S1/S2 Resp: clear to auscultation bilaterally, normal RR and effort noted GI: soft, no tenderness, with active bowel sounds.   Assessment: Encounter Diagnosis  Name Primary?   Personal history of colonic polyps Yes     Plan: Colonoscopy  The benefits and risks of the planned procedure were described in detail with the patient or (when appropriate) their health care proxy.  Risks were outlined as including, but not limited to, bleeding, infection, perforation, adverse medication reaction leading to cardiac or pulmonary decompensation, pancreatitis (if ERCP).  The limitation of incomplete mucosal visualization was also discussed.  No guarantees or warranties were given.    The patient is appropriate for an endoscopic procedure in the ambulatory setting.   - Wilfrid Lund, MD

## 2021-04-03 ENCOUNTER — Telehealth: Payer: Self-pay

## 2021-04-03 NOTE — Telephone Encounter (Signed)
Per 04/02/21 procedure report - Return to my office at appt to be scheduled to discuss chronic constipation.   Patient is already scheduled for a follow up with Dr. Loletha Carrow on Tuesday, 07/14/20 at 9:20 am.

## 2021-04-04 ENCOUNTER — Ambulatory Visit: Payer: BC Managed Care – PPO | Admitting: Internal Medicine

## 2021-04-04 ENCOUNTER — Telehealth: Payer: Self-pay

## 2021-04-04 NOTE — Telephone Encounter (Signed)
  Follow up Call-  Call back number 04/02/2021  Post procedure Call Back phone  # [phone]  Permission to leave phone message Yes  Some recent data might be hidden     Patient questions:  Do you have a fever, pain , or abdominal swelling? No. Pain Score  0 *  Have you tolerated food without any problems? Yes.    Have you been able to return to your normal activities? Yes.    Do you have any questions about your discharge instructions: Diet   No. Medications  No. Follow up visit  No.  Do you have questions or concerns about your Care? No.  Actions: * If pain score is 4 or above: No action needed, pain <4. Have you developed a fever since your procedure? no  2.   Have you had an respiratory symptoms (SOB or cough) since your procedure? no  3.   Have you tested positive for COVID 19 since your procedure no  4.   Have you had any family members/close contacts diagnosed with the COVID 19 since your procedure?  no   If yes to any of these questions please route to Joylene John, RN and Joella Prince, RN

## 2021-04-04 NOTE — Telephone Encounter (Signed)
Patient walks into to [facility] for Ozempic sample. Sample was approved by Gwendlyn Deutscher, per [place].   Sample given to patient.   Lot: [id number]  Ex: 07/31/2023

## 2021-04-05 ENCOUNTER — Encounter: Payer: Self-pay | Admitting: Gastroenterology

## 2021-04-07 ENCOUNTER — Other Ambulatory Visit: Payer: Self-pay | Admitting: Family Medicine

## 2021-04-07 DIAGNOSIS — R0982 Postnasal drip: Secondary | ICD-10-CM

## 2021-04-08 ENCOUNTER — Ambulatory Visit (INDEPENDENT_AMBULATORY_CARE_PROVIDER_SITE_OTHER): Payer: BC Managed Care – PPO | Admitting: Internal Medicine

## 2021-04-08 ENCOUNTER — Other Ambulatory Visit: Payer: Self-pay

## 2021-04-08 VITALS — BP 130/82 | HR 66 | Ht 67.0 in | Wt 218.0 lb

## 2021-04-08 DIAGNOSIS — E0510 Thyrotoxicosis with toxic single thyroid nodule without thyrotoxic crisis or storm: Secondary | ICD-10-CM | POA: Diagnosis not present

## 2021-04-08 DIAGNOSIS — E042 Nontoxic multinodular goiter: Secondary | ICD-10-CM | POA: Diagnosis not present

## 2021-04-08 LAB — T3, FREE: T3, Free: 3.3 pg/mL (ref 2.3–4.2)

## 2021-04-08 LAB — T4, FREE: Free T4: 0.76 ng/dL (ref 0.60–1.60)

## 2021-04-08 LAB — TSH: TSH: 0.14 u[IU]/mL — ABNORMAL LOW (ref 0.35–5.50)

## 2021-04-08 NOTE — Progress Notes (Signed)
Patient ID: Cynthia Wilson, female   DOB: [birth year], 55 y.o.   MRN: [id number]  This visit occurred during the SARS-CoV-2 public health emergency.  Safety protocols were in place, including screening questions prior to the visit, additional usage of staff PPE, and extensive cleaning of exam room while observing appropriate contact time as indicated for disinfecting solutions.   HPI  Cynthia Wilson is a 55 y.o.-year-old female, referred by her PCP, Dr. Gwendlyn Deutscher, in consultation for subclinical thyrotoxicosis and goiter.  Last visit 6 months ago.  Interim history: Since last visit, she opted not to go through with RAI treatment.  Reviewed and addended history: She was diagnosed with a multinodular goiter in 2004.  I reviewed her TFTs: Lab Results  Component Value Date   TSH 0.16 (L) 09/26/2020   TSH 0.192 (L) 07/31/2020   TSH 0.496 04/28/2018   TSH 0.22 (L) 07/25/2016   TSH 0.69 07/25/2014   TSH 0.268 (L) 07/07/2014   TSH 0.523 03/09/2012   FREET4 0.81 09/26/2020   FREET4 0.9 07/25/2016   FREET4 0.77 07/25/2014   FREET4 1.00 07/07/2014  TPO antibodies were normal: 1.  Graves' antibodies were not elevated: Lab Results  Component Value Date   TSI <89 09/26/2020   Previous investigation: She had a Thyroid scan in 2004: 1.  NORMAL RIGHT LOBE OF THE THYROID. 2.  THREE COLD NODULES OF THE LEFT LOBE.    She had a thyroid U/S (04/08/2003): 1. DOMINANT 3.3 CM IN DIAMETER COMPLEX MASS CONTAINING CYSTIC AND SOLID COMPONENTS INVOLVING THE MAJORITY OF THE MID AND LOWER POLE OF THE LEFT LOBE OF THYROID CORRESPONDING TO THE COLD NODULE SEEN IN THIS REGION ON NUCLEAR MEDICINE STUDY.   2. THERE IS A SECOND 1.6 CM SIZE COMPLEX NODULE ASSOCIATED IN THE LOWER POLE OF THE LEFT LOBE OF THYROID.  3. SMALLER (6 AND 8 MM IN SIZE) NODULES WITHIN THE MID RIGHT LOBE OF THE THYROID.  L Thyroid nodule FNA (05/10/2003): The morphologic features are most consistent with a colloid nodule.    Thyroid U/S at [facility] (05/29/2014):  Several R nodules:1.4 cm (heterogeneous) and 1.1 cm (hypoechoic), vascular, nodules and 3 subcm nodules 3 L nodules: 2.1 cm (hypervascular), the rest smaller: 1.5 and 0.7 cm hypoechoic nodules.  Thyroid U/S (08/07/2016): Parenchymal Echotexture: Mildly heterogenous Isthmus: Measures 0.3 cm in the AP dimension. Right lobe: 5.3 x 1.6 x 1.7 cm Left lobe: 5.5 x 1.6 x 2.1 cm _________________________________________________________    Nodule # 1: Location: Right; Mid, pregnancy was increased from a maximum URI Maximum size: 1.3 cm; Other 2 dimensions: 0.8 x 0.9 cm Composition: solid/almost completely solid (2)  Echogenicity: hypoechoic (2) ACR TI-RADS total points: 4. *Given size (>/= 1 - 1.4 cm) and appearance, a follow-up ultrasound in 1 year should be considered based on TI-RADS criteria.   Nodule # 2: Location: Right; Mid Maximum size: 1.5 cm; Other 2 dimensions: 0.8 x 1.0 cm Composition: solid/almost completely solid (2)  Echogenicity: hypoechoic (2) Echogenic foci: punctate echogenic foci (3) ACR TI-RADS total points: 7.  **Given size (>/= 1.0 cm) and appearance, fine needle aspiration of this highly suspicious nodule should be considered based on TI-RADS criteria.   Multiple additional small nodules throughout the right thyroid lobe. There are 2 cysts in the right inferior thyroid, largest measuring up to 0.7 cm. ______________________________________________________________   Dominant mildly hypoechoic solid nodule in the mid left thyroid lobe that measures 2.3 x 1.3 x 1.6 cm. By report, this previously measured  3.3 x 1.5 x 2.1 cm. This appears to represent the previously biopsied left thyroid nodule. Few small areas of cystic degeneration within this nodule. There are questionable echogenic foci throughout this nodule.   Nodule # 3: Location: Left; Inferior Maximum size: 1.5 cm; Other 2 dimensions: 0.7 x 1.1 cm. By  report, there was a nodule in the inferior left thyroid lobe that measured 1.6 x 0.9 x 1.0 cm. Composition: solid/almost completely solid (2) Echogenicity: hypoechoic (2) Shape: not taller-than-wide (0) Margins: lobulated/irregular (2) Echogenic foci: macrocalcifications (1) ACR TI-RADS total points: 7.  **Given size (>/= 1.0 cm) and appearance, fine needle aspiration of this highly suspicious nodule should be considered based on TI-RADS criteria.   IMPRESSION: Multiple thyroid nodules.   The dominant nodule in left thyroid lobe measures up to 2.3 cm and appears to represent the previously biopsied nodule.   Nodule in the inferior left thyroid lobe (Nodule #3) and a nodule in the right mid thyroid lobe (Nodule #2) both meet criteria for biopsy.   Nodule #1 in the mid right thyroid lobe meets criteria for 1 year follow-up.    FNA of the dominant nodules (09/10/2017): Adequacy Reason UNSATISFACTORY for evaluation due to extremely scant cellularity. The specimen is processed and examined microscopically, but is found to be unsatisfactory for evaluation of an epithelial abnormality. Repeat study recommended. Diagnosis THYROID, FINE NEEDLE ASPIRATION LEFT LOWER POLE (SPECIMEN 1 OF 2 COLLECTED 09/10/2016) BLOOD. NO FOLLICULAR CELLULARITY PRESENT. Claudette Laws MD Pathologist, Electronic Signature (Case signed 09/11/2016) Specimen Clinical Information Previous Biopsy today and 05/11/2003), Nodule 3: Left Inferior 1.5 cm; Other 2 dimensions: 0.7 x 1.1 cm., By report there was nodule in the inferior left thyroid lobe that measured 1.6 x 0.9 x 1.0 cm., Hypoechoic, ACR TI- RADS total points:7, Highly suspicious nodule  Adequacy Reason Satisfactory For Evaluation. Diagnosis THYROID, FINE NEEDLE ASPIRATION, RMP (SPECIMEN 2 OF 2, COLLECTED 09/10/16): CONSISTENT WITH BENIGN FOLLICULAR NODULE (BETHESDA CATEGORY II). Claudette Laws MD Pathologist, Electronic Signature (Case signed  09/11/2016) Specimen Clinical Information Today and11/05/2003, Nodule 2: Right Mid 1.5 cm; Other 2 dimensions: 0.8 x 1.0cm solid /almost completely solid , Hypoechoic, ACR TI - RADS total points: 7, Highly suspicious nodule Source Thyroid, Fine Needle Aspiration, Rt Lobe RMP, (Specimen 2of 2, collected on 09/10/16 )  Thyroid uptake and scan (10/25/2020): Low uptake in left hot nodule versus toxic multinodular goiter: Multiple bilateral nodules. Gland normal size. One warm nodule in the upper pole of the LEFT lobe. 4 hour I-123 uptake = 4.4% (normal 5-20%) 24 hour I-123 uptake = 13.0% (normal 10-30%)   IMPRESSION: 1. Multinodular gland. 2. Low I 131 uptake. 3. With depressed TSH, findings most suggestive of toxic multinodular goiter versus versus autonomous nodule in the upper pole LEFT thyroid gland.  Pt denies: - feeling nodules in neck - hoarseness - dysphagia - choking - SOB with lying down  Pt denies: - weight loss - tremors - palpitations - anxiety - hyperdefecation - hair loss But she continues to have hot flashes.  These are chronic for her.  No FH of thyroid disease or of thyroid cancer. No h/o radiation tx to head or neck.  No herbal supplements. No Biotin use. No recent steroids use.   She also has a history of HTN, GERD, DM2.  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  ROS: + see HPI  I reviewed pt's medications, allergies, PMH, social hx, family hx:  Past Medical History:  Diagnosis Date   Allergy    Arthritis    GERD (gastroesophageal reflux disease)    Heart murmur    Heart murmur 02/01/2013   History of osteoarthritis    left knee   Hypertension    under control with med., has been on med. x 5 yr.   Immature cataract 2021   removed bilaterally   KNEE PAIN, LEFT, CHRONIC 07/30/2009   Seen by [facility]ue ortho 03/31/12,DJD,  steroid injection     Medial meniscus tear 09/2013   right knee   Non-insulin dependent type 2 diabetes mellitus ([place])    off metformin- diet controlled per pt    Pelvic peritoneal adhesions, female 04/21/2013   Recurrent tonsillitis 12/01/2016   Sinus headache 10/30/2015   Wears partial dentures    upper   Past Surgical History:  Procedure Laterality Date   CATARACT EXTRACTION Right 01/2020   CATARACT EXTRACTION Left 11/2019   COLONOSCOPY  2009   normal    CORNEAL TRANSPLANT Right 2022   CYST EXCISION  03/25/2007   resection peritoneal inclusion cyst   HERNIA REPAIR     JOINT REPLACEMENT Left    knee   KNEE ARTHROSCOPY Left 2011   KNEE ARTHROSCOPY Right 10/21/2013   Procedure: ARTHROSCOPY RIGHT KNEE;  Surgeon: Alta Corning, MD;  Location: [place];  Service: Orthopedics;  Laterality: Right;  medial , lateral and patella femoral chondromalsia and medial plica   LAPAROSCOPY N/A 04/21/2013   Procedure: LAPAROSCOPY DIAGNOSTIC;  Surgeon: Lavonia Drafts, MD;  Location: [facility];  Service: Gynecology;  Laterality: N/A;   LAPAROTOMY N/A 04/21/2013   Procedure: LAPAROTOMY;  Surgeon: Lavonia Drafts, MD;  Location: [facility];  Service: Gynecology;  Laterality: N/A;   LYSIS OF ADHESION  08/28/2005; 03/25/2007; 04/21/2013   POLYPECTOMY     SALPINGOOPHORECTOMY Left 04/21/2013   Procedure: SALPINGO OOPHORECTOMY;  Surgeon: Lavonia Drafts, MD;  Location: [facility] [facility];  Service: Gynecology;  Laterality: Left;   TOTAL ABDOMINAL HYSTERECTOMY  08/28/2005   TOTAL KNEE ARTHROPLASTY Left 02/11/2013   Procedure: LEFT TOTAL KNEE ARTHROPLASTY;  Surgeon: Alta Corning, MD;  Location: [place];  Service: Orthopedics;  Laterality: Left;  NO COMPUTER   UMBILICAL HERNIA REPAIR  1984   UNILATERAL SALPINGECTOMY Right 08/28/2005   History   Social History   Marital Status: Married    Spouse Name: N/A    Number of Children: 4   Occupational History    Museum/gallery conservator    Social History Main  Topics   Smoking status: Never Smoker    Smokeless tobacco: Never Used   Alcohol Use: No   Drug Use: No   Sexual Activity: Yes    Birth Control/ Protection: Surgical   Social History Narrative   Lives with husband and 20 year old son.     Current Outpatient Medications on File Prior to Visit  Medication Sig Dispense Refill   acetaminophen (TYLENOL) 500 MG tablet Take 500 mg by mouth every 6 (six) hours as needed. (Patient not taking: No sig reported)     diclofenac sodium (VOLTAREN) 1 % GEL Apply 4 g to your back four times daily as needed for pain (Patient not taking: No sig reported) 100 g 0   docusate sodium (COLACE) 100 MG capsule Take 200 mg by mouth daily. (Patient not taking: No sig reported)     famotidine (PEPCID) 20 MG tablet Take 1 tablet (  20 mg total) by mouth 2 (two) times daily.     fluticasone (FLONASE) 50 MCG/ACT nasal spray SPRAY 2 SPRAYS INTO EACH NOSTRIL EVERY DAY (Patient not taking: No sig reported) 16 mL 6   ibuprofen (ADVIL) 600 MG tablet Take 1 tablet (600 mg total) by mouth every 8 (eight) hours as needed. (Patient not taking: No sig reported) 30 tablet 0   lisinopril (ZESTRIL) 5 MG tablet TAKE 1 TABLET BY MOUTH EVERY DAY 90 tablet 1   loratadine (CLARITIN) 10 MG tablet Take 10 mg by mouth daily.     Multiple Vitamin (MULTIVITAMIN) tablet Take 1 tablet by mouth daily. Womens Ultra Mega Vitamin     Semaglutide, 1 MG/DOSE, (OZEMPIC, 1 MG/DOSE,) 4 MG/3ML SOPN Inject 1 mg into the skin once a week. 3 mL 2   No current facility-administered medications on file prior to visit.   Allergies  Allergen Reactions   Iohexol Hives         Ivp Dye [Iodinated Diagnostic Agents] Hives    Denies airway involvement   Other Rash    COBAN   Family History  Problem Relation Age of Onset   Diabetes Mother    Hypertension Mother    Hyperlipidemia Mother    Colon polyps Mother    Emphysema Father    Breast cancer Cousin 38   Colon polyps Sister    Esophageal cancer  Brother    Colon cancer Neg Hx    Rectal cancer Neg Hx    Stomach cancer Neg Hx    PE: BP 130/82 (BP Location: Right Arm, Patient Position: Sitting, Cuff Size: Normal)   Pulse 66   Ht 5\' 7"  (1.702 m)   Wt 218 lb (98.9 kg)   SpO2 99%   BMI 34.14 kg/m  Body mass index is 34.14 kg/m.  Wt Readings from Last 3 Encounters:  04/08/21 218 lb (98.9 kg)  04/02/21 214 lb (97.1 kg)  03/26/21 216 lb 6.4 oz (98.2 kg)   Constitutional: overweight, in NAD Eyes: PERRLA, EOMI, no exophthalmos ENT: moist mucous membranes, + very mild left thyroid fullness, no cervical lymphadenopathy Cardiovascular: RRR, No MRG Respiratory: CTA B Gastrointestinal: abdomen soft, NT, ND, BS+ Musculoskeletal: no deformities, strength intact in all 4 Skin: moist, warm, no rashes Neurological: no tremor with outstretched hands, DTR normal in all 4  ASSESSMENT: 1.  Subclinical thyrotoxicosis  2.  Multinodular goiter - 2004 FNA of the left dominant nodule: benign - 2018 FNA of them left lung dominant nodule: Benign - 2018 FNA of the right dominant nodule: Inconclusive due to unsatisfactory sample  PLAN:  1. 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 -At last visit, in 08/2020, she returned after 3 years of absence.  Her most recent TSH was suppressed, at 0.19, with normal free T4 and free T3.  She did not have thyrotoxic symptoms but she continued to experience hot flashes.  At that time, we repeated her TFTs and the TSH was slightly lower, at 0.16.  TSI antibodies were undetectable.  At that time, I suggested thyroid uptake and scan first and RAI treatment afterwards.  She had a thyroid uptake and scan on 10/25/2020.  This showed low uptake with a left heart nodule versus toxic multinodular goiter.  I ordered the RAI treatment but she did not proceed with this.  Of note, she would not want to consider thyroidectomy. -At this visit, we will recheck her TFTs.   We again discussed  that methimazole treatment is an option, but this will need to be lifelong.  I again recommended RAI ablation as a definitive treatment.  At this point, she is undecided.  She is reticent to have the RAI treatment, especially as she does not have any symptoms from her mild thyrotoxicosis.  She is able to exercise consistently at the gym without fatigue.   -We decided to wait for the results to return and then see if she absolutely needs the RAI treatment.  2. Multinodular goiter -Patient with long history of thyroid nodules, status post thyroid scan in 2004 that showed 3 cold L thyroid nodules.  She had FNA of the dominant nodule which at that time measured 3.3 cm, and this was benign.  This nodule subsequently decreased in size and on the ultrasound from 2018, the largest dimension was 2.3 cm.  On this ultrasound, she had another, smaller, nodule, in the left lobe, which was hypoechoic and had microcalcifications.  A biopsy of this nodule in 2018 was benign.  In the right lobe, the 2018 ultrasound showed 2 nodules: 1 hypoechoic and low risk with a recommendation to be just followed up without intervention, but 1 hypoechoic, with microcalcifications with a recommendation for biopsy.  We biopsied the nodule in 2018 but the biopsy was inconclusive due to scant material.  At that time, I suggested repeat biopsy in 6 months but she was lost for follow-up afterwards.  At last visit, we decided to proceed with another thyroid ultrasound after the results of the uptake and scan are back.  After the results returned, since the nodules were warm or hot, I did not suggest a new biopsy at that time since overproducing thyroid nodules are usually benign. -At this visit, she has no neck compression symptoms or nodules felt on palpation.  She does have very mild left thyroid fullness. -We will also recheck her TFTs now  Component     Latest Ref Rng & Units 04/08/2021  TSH     0.35 - 5.50 uIU/mL 0.14  (L)  T4,Free(Direct)     0.60 - 1.60 ng/dL 0.76  Triiodothyronine,Free,Serum     2.3 - 4.2 pg/mL 3.3  TSH approximately stable.  Free thyroid hormones are normal.  I would still recommend RAI treatment, but will check with her.  Philemon Kingdom, MD PhD [facility]

## 2021-04-08 NOTE — Patient Instructions (Addendum)
Please stop at the lab.  If we proceed with RAI treatment, please call the following number to schedule it: [phone]  Please return in 6 months.

## 2021-04-09 ENCOUNTER — Encounter: Payer: Self-pay | Admitting: Internal Medicine

## 2021-04-10 ENCOUNTER — Encounter: Payer: Self-pay | Admitting: Family Medicine

## 2021-04-18 ENCOUNTER — Encounter: Payer: Self-pay | Admitting: Family Medicine

## 2021-04-23 ENCOUNTER — Encounter: Payer: Self-pay | Admitting: Family Medicine

## 2021-05-14 ENCOUNTER — Encounter: Payer: Self-pay | Admitting: Gastroenterology

## 2021-05-14 ENCOUNTER — Ambulatory Visit (INDEPENDENT_AMBULATORY_CARE_PROVIDER_SITE_OTHER): Payer: BC Managed Care – PPO | Admitting: Gastroenterology

## 2021-05-14 VITALS — BP 140/88 | HR 65 | Ht 67.0 in | Wt 216.1 lb

## 2021-05-14 DIAGNOSIS — K5904 Chronic idiopathic constipation: Secondary | ICD-10-CM

## 2021-05-14 NOTE — Progress Notes (Signed)
[facility] Progress Note  Chief Complaint: Chronic constipation  Subjective  History: Cynthia Wilson recently saw me for a surveillance colonoscopy, diverticulosis noted, diminutive SSP and TA removed, recall 5 years.  She had an 8 to 10 mm SSP in 2019.  At the time of recent procedure she describes severe constipation, it was noted the bowel preparation was initially fair then improved to good with lavage and plans for 2-day prep with next exam. _________________ Cynthia Wilson is having a BM about once a week and describes bloating, left upper quadrant pain and little improvement with herbal tea, MiraLAX and stool softener. She reports having this severe constipation for many years, and at one point think she may have had work-up for possible diverticulitis (?  CT scan) After several days of no BM or having just small pellet-like stools, she has bloating and abdominal pain as noted above.  It also then seems to affect her appetite. Denies rectal or vaginal prolapse, has not engaged in measures such as perineal pressure or manual disimpaction.  ROS: Cardiovascular:  no chest pain Respiratory: no dyspnea Remainder of systems negative except as above The patient's Past Medical, Family and Social History were reviewed and are on file in the EMR. Past Medical History:  Diagnosis Date   Allergy    Arthritis    GERD (gastroesophageal reflux disease)    Heart murmur    Heart murmur 02/01/2013   History of osteoarthritis    left knee   Hypertension    under control with med., has been on med. x 5 yr.   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/21/2013   Recurrent tonsillitis 12/01/2016   Sinus headache  10/30/2015   Wears partial dentures    upper    Objective:  Med list reviewed  Current Outpatient Medications:    acetaminophen (TYLENOL) 500 MG tablet, Take 500 mg by mouth every 6 (six) hours as needed., Disp: , Rfl:    diclofenac sodium (VOLTAREN) 1 % GEL, Apply 4 g to your back four times daily as needed for pain, Disp: 100 g, Rfl: 0   famotidine (PEPCID) 20 MG tablet, Take 1 tablet (20 mg total) by mouth 2 (two) times daily., Disp: , Rfl:    fluticasone (FLONASE) 50 MCG/ACT nasal spray, SPRAY 2 SPRAYS INTO EACH NOSTRIL EVERY DAY, Disp: 16 mL, Rfl: 6   ibuprofen (ADVIL) 600 MG tablet, Take 1 tablet (600 mg total) by mouth every 8 (eight) hours as needed., Disp: 30 tablet, Rfl: 0   lisinopril (ZESTRIL) 5 MG tablet, TAKE 1 TABLET BY MOUTH EVERY DAY, Disp: 90 tablet, Rfl: 1   loratadine (CLARITIN) 10 MG tablet, Take 10 mg by mouth daily., Disp: , Rfl:    Multiple Vitamin (MULTIVITAMIN) tablet, Take 1 tablet by mouth daily. Womens Ultra Mega Vitamin, Disp: , Rfl:    Semaglutide, 1 MG/DOSE, (OZEMPIC, 1 MG/DOSE,) 4 MG/3ML SOPN, Inject 1 mg into the skin once a week., Disp: 3 mL, Rfl: 2   Vital signs in last 24 hrs: Vitals:   05/14/21 0925  BP: 140/88  Pulse: 65  SpO2: 98%   Wt Readings from Last 3 Encounters:  05/14/21 216 lb  2 oz (98 kg)  04/08/21 218 lb (98.9 kg)  04/02/21 214 lb (97.1 kg)    Physical Exam  Well-appearing  Cardiac: RRR without murmurs, S1S2 heard, no peripheral edema Pulm: clear to auscultation bilaterally, normal RR and effort noted Abdomen: soft, no tenderness, with active bowel sounds. No guarding or palpable hepatosplenomegaly. Skin; warm and dry, no jaundice or rash  Labs:   ___________________________________________ Radiologic studies:   ____________________________________________ Other:   _____________________________________________ Assessment & Plan  Assessment: Encounter Diagnosis  Name Primary?   Chronic idiopathic constipation  Yes   Longstanding symptoms, sounds like slow GI motility rather than pelvic floor dysfunction or rectocele.  The diverticulosis may be contributing to some degree. She has also tried multiple remedies, she eats a high-fiber diet, consumes plenty of water and exercises regularly.  I recommended a trial of prescription medication, she understands there will be some trial and error with different doses or medicines to try and relieve her symptoms.  Plan: Samples of Linzess 72 and 145 mcg given.  She will start with lower dose for a week and increase to higher dose if not much improvement. I asked her to keep in touch with me by portal message in a few weeks, and we can make further recommendations at that time.  33minutes were spent on this encounter (including chart review, history/exam, counseling/coordination of care, and documentation) > 50% of that time was spent on counseling and coordination of care.   Nelida Meuse III

## 2021-05-14 NOTE — Patient Instructions (Signed)
If you are age 57 or older, your body mass index should be between 23-30. Your Body mass index is 33.85 kg/m. If this is out of the aforementioned range listed, please consider follow up with your Primary Care Provider.  If you are age 75 or younger, your body mass index should be between 19-25. Your Body mass index is 33.85 kg/m. If this is out of the aformentioned range listed, please consider follow up with your Primary Care Provider.   ________________________________________________________  The [place] [facility] providers would like to encourage you to use [facility] to communicate with providers for non-urgent requests or questions.  Due to long hold times on the telephone, sending your provider a message by [facility] may be a faster and more efficient way to get a response.  Please allow 48 business hours for a response.  Please remember that this is for non-urgent requests.  _______________________________________________________  Medication Samples have been provided to the patient. Linzess 54mcg and Linzess 117mcg.  Please start with the Linzess 27mcg and increase if needed.   The patient has been instructed regarding the correct time, dose, and frequency of taking this medication, including desired effects and most common side effects.    It was a pleasure to see you today!  Thank you for trusting me with your gastrointestinal care!

## 2021-05-27 ENCOUNTER — Other Ambulatory Visit: Payer: Self-pay

## 2021-05-27 ENCOUNTER — Encounter: Payer: Self-pay | Admitting: Gastroenterology

## 2021-05-27 DIAGNOSIS — H18512 Endothelial corneal dystrophy, left eye: Secondary | ICD-10-CM | POA: Diagnosis not present

## 2021-05-27 DIAGNOSIS — H40003 Preglaucoma, unspecified, bilateral: Secondary | ICD-10-CM | POA: Diagnosis not present

## 2021-05-27 DIAGNOSIS — Z961 Presence of intraocular lens: Secondary | ICD-10-CM | POA: Diagnosis not present

## 2021-05-27 DIAGNOSIS — Z947 Corneal transplant status: Secondary | ICD-10-CM | POA: Diagnosis not present

## 2021-05-27 MED ORDER — LINACLOTIDE 72 MCG PO CAPS: 72.0000 ug | ORAL_CAPSULE | Freq: Every day | ORAL | 2 refills | Status: DC

## 2021-06-09 ENCOUNTER — Encounter: Payer: Self-pay | Admitting: Family Medicine

## 2021-07-08 DIAGNOSIS — H18513 Endothelial corneal dystrophy, bilateral: Secondary | ICD-10-CM | POA: Diagnosis not present

## 2021-07-08 DIAGNOSIS — Z947 Corneal transplant status: Secondary | ICD-10-CM | POA: Diagnosis not present

## 2021-07-08 DIAGNOSIS — H40013 Open angle with borderline findings, low risk, bilateral: Secondary | ICD-10-CM | POA: Diagnosis not present

## 2021-07-08 LAB — HM DIABETES EYE EXAM

## 2021-07-09 ENCOUNTER — Encounter: Payer: Self-pay | Admitting: Family Medicine

## 2021-07-09 NOTE — Progress Notes (Signed)
Eye exam completed on 07/08/21 at [facility]

## 2021-07-17 ENCOUNTER — Other Ambulatory Visit: Payer: Self-pay | Admitting: Family Medicine

## 2021-07-17 ENCOUNTER — Encounter: Payer: Self-pay | Admitting: Family Medicine

## 2021-08-07 ENCOUNTER — Other Ambulatory Visit: Payer: Self-pay | Admitting: Gastroenterology

## 2021-08-07 ENCOUNTER — Other Ambulatory Visit: Payer: Self-pay | Admitting: Family Medicine

## 2021-08-10 ENCOUNTER — Encounter: Payer: Self-pay | Admitting: Family Medicine

## 2021-08-15 ENCOUNTER — Encounter: Payer: Self-pay | Admitting: Family Medicine

## 2021-08-16 ENCOUNTER — Other Ambulatory Visit: Payer: Self-pay

## 2021-08-16 ENCOUNTER — Encounter: Payer: Self-pay | Admitting: Family Medicine

## 2021-08-16 ENCOUNTER — Ambulatory Visit (INDEPENDENT_AMBULATORY_CARE_PROVIDER_SITE_OTHER): Payer: BC Managed Care – PPO | Admitting: Family Medicine

## 2021-08-16 VITALS — BP 127/79 | HR 68 | Ht 67.0 in | Wt 215.4 lb

## 2021-08-16 DIAGNOSIS — R739 Hyperglycemia, unspecified: Secondary | ICD-10-CM

## 2021-08-16 DIAGNOSIS — Z111 Encounter for screening for respiratory tuberculosis: Secondary | ICD-10-CM | POA: Diagnosis not present

## 2021-08-16 DIAGNOSIS — E119 Type 2 diabetes mellitus without complications: Secondary | ICD-10-CM | POA: Diagnosis not present

## 2021-08-16 DIAGNOSIS — I10 Essential (primary) hypertension: Secondary | ICD-10-CM

## 2021-08-16 LAB — POCT GLYCOSYLATED HEMOGLOBIN (HGB A1C): HbA1c, POC (controlled diabetic range): 5.7 % (ref 0.0–7.0)

## 2021-08-16 MED ORDER — LISINOPRIL 2.5 MG PO TABS: 2.5000 mg | ORAL_TABLET | Freq: Every day | ORAL | 1 refills | Status: DC

## 2021-08-16 NOTE — Patient Instructions (Signed)
Tirzepatide Injection What is this medication? TIRZEPATIDE (tir ZEP a tide) treats type 2 diabetes. It works by increasing insulin levels in your body, which decreases your blood sugar (glucose). Changes to diet and exercise are often combined with this medication. This medicine may be used for other purposes; ask your health care provider or pharmacist if you have questions. COMMON BRAND NAME(S): MOUNJARO What should I tell my care team before I take this medication? They need to know if you have any of these conditions: Endocrine tumors (MEN 2) or if someone in your family had these tumors Eye disease, vision problems Gallbladder disease History of pancreatitis Kidney disease Stomach or intestine problems Thyroid cancer or if someone in your family had thyroid cancer An unusual or allergic reaction to tirzepatide, other medications, foods, dyes, or preservatives Pregnant or trying to get pregnant Breast-feeding How should I use this medication? This medication is injected under the skin. You will be taught how to prepare and give it. It is given once every week (every 7 days). Keep taking it unless your health care provider tells you to stop. If you use this medication with insulin, you should inject this medication and the insulin separately. Do not mix them together. Do not give the injections right next to each other. Change (rotate) injection sites with each injection. This medication comes with INSTRUCTIONS FOR USE. Ask your pharmacist for directions on how to use this medication. Read the information carefully. Talk to your pharmacist or care team if you have questions. It is important that you put your used needles and syringes in a special sharps container. Do not put them in a trash can. If you do not have a sharps container, call your pharmacist or care team to get one. A special MedGuide will be given to you by the pharmacist with each prescription and refill. Be sure to read this  information carefully each time. Talk to your care team about the use of this medication in children. Special care may be needed. Overdosage: If you think you have taken too much of this medicine contact a poison control center or emergency room at once. NOTE: This medicine is only for you. Do not share this medicine with others. What if I miss a dose? If you miss a dose, take it as soon as you can unless it is more than 4 days (96 hours) late. If it is more than 4 days late, skip the missed dose. Take the next dose at the normal time. Do not take 2 doses within 3 days of each other. What may interact with this medication? Alcohol containing beverages Antiviral medications for HIV or AIDS Aspirin and aspirin-like medications Beta-blockers like atenolol, metoprolol, propranolol Certain medications for blood pressure, heart disease, irregular heart beat Chromium Clonidine Diuretics Female hormones, such as estrogens or progestins, birth control pills Fenofibrate Gemfibrozil Guanethidine Isoniazid Lanreotide Female hormones or anabolic steroids MAOIs like Carbex, Eldepryl, Marplan, Nardil, and Parnate Medications for weight loss Medications for allergies, asthma, cold, or cough Medications for depression, anxiety, or psychotic disturbances Niacin Nicotine NSAIDs, medications for pain and inflammation, like ibuprofen or naproxen Octreotide Other medications for diabetes, like glyburide, glipizide, or glimepiride Pasireotide Pentamidine Phenytoin Probenecid Quinolone antibiotics such as ciprofloxacin, levofloxacin, ofloxacin Reserpine Some herbal dietary supplements Steroid medications such as prednisone or cortisone Sulfamethoxazole; trimethoprim Thyroid hormones Warfarin This list may not describe all possible interactions. Give your health care provider a list of all the medicines, herbs, non-prescription drugs, or dietary supplements you  use. Also tell them if you smoke, drink  alcohol, or use illegal drugs. Some items may interact with your medicine. What should I watch for while using this medication? Visit your care team for regular checks on your progress. Drink plenty of fluids while taking this medication. Check with your care team if you get an attack of severe diarrhea, nausea, and vomiting. The loss of too much body fluid can make it dangerous for you to take this medication. A test called the HbA1C (A1C) will be monitored. This is a simple blood test. It measures your blood sugar control over the last 2 to 3 months. You will receive this test every 3 to 6 months. Learn how to check your blood sugar. Learn the symptoms of low and high blood sugar and how to manage them. Always carry a quick-source of sugar with you in case you have symptoms of low blood sugar. Examples include hard sugar candy or glucose tablets. Make sure others know that you can choke if you eat or drink when you develop serious symptoms of low blood sugar, such as seizures or unconsciousness. They must get medical help at once. Tell your care team if you have high blood sugar. You might need to change the dose of your medication. If you are sick or exercising more than usual, you might need to change the dose of your medication. Do not skip meals. Ask your care team if you should avoid alcohol. Many nonprescription cough and cold products contain sugar or alcohol. These can affect blood sugar. Pens should never be shared. Even if the needle is changed, sharing may result in passing of viruses like hepatitis or HIV. Wear a medical ID bracelet or chain, and carry a card that describes your disease and details of your medication and dosage times. Birth control may not work properly while you are taking this medication. If you take birth control pills by mouth, your care team may recommend another type of birth control for 4 weeks after you start this medication and for 4 weeks after each increase in  your dose of this medication. Ask your care team which birth control methods you should use. What side effects may I notice from receiving this medication? Side effects that you should report to your care team as soon as possible: Allergic reactions--skin rash, itching, hives, swelling of the face, lips, tongue, or throat Change in vision Dehydration--increased thirst, dry mouth, feeling faint or lightheaded, headache, dark yellow or brown urine Gallbladder problems--severe stomach pain, nausea, vomiting, fever Kidney injury--decrease in the amount of urine, swelling of the ankles, hands, or feet Pancreatitis--severe stomach pain that spreads to your back or gets worse after eating or when touched, fever, nausea, vomiting Thyroid cancer--new mass or lump in the neck, pain or trouble swallowing, trouble breathing, hoarseness Side effects that usually do not require medical attention (report these to your care team if they continue or are bothersome): Constipation Diarrhea Loss of Appetite Nausea Stomach pain Upset stomach Vomiting This list may not describe all possible side effects. Call your doctor for medical advice about side effects. You may report side effects to FDA at 1-800-FDA-1088. Where should I keep my medication? Keep out of the reach of children and pets. Refrigeration (preferred): Store unopened pens in a refrigerator between 2 and 8 degrees C (36 and 46 degrees F). Keep it in the original carton until you are ready to take it. Do not freeze or use if the medication has been frozen.  Protect from light. Get rid of any unused medication after the expiration date on the label. Room Temperature: The pen may be stored at room temperature below 30 degrees C (86 degrees F) for up to a total of 21 days if needed. Protect from light. Avoid exposure to extreme heat. If it is stored at room temperature, throw away any unused medication after 21 days or after it expires, whichever is  first. The pen has glass parts. Handle it carefully. If you drop the pen on a hard surface, do not use it. Use a new pen for your injection. To get rid of medications that are no longer needed or have expired: Take the medication to a medication take-back program. Check with your pharmacy or law enforcement to find a location. If you cannot return the medication, ask your pharmacist or care team how to get rid of this medication safely. NOTE: This sheet is a summary. It may not cover all possible information. If you have questions about this medicine, talk to your doctor, pharmacist, or health care provider.  2022 Elsevier/Gold Standard (2020-11-14 00:00:00)

## 2021-08-16 NOTE — Progress Notes (Signed)
° ° °  SUBJECTIVE:   CHIEF COMPLAINT / HPI:   DM2/HLD/Weight management: She is here for a follow-up. She is compliant with Ozempic 1 mg weekly. Denies s/e to the medication. She is working on diet and exercise for her cholesterol, although had not lost much weight in the past months. She asked if her Ozempic dose can be increased.  HTN: Off Lisinopril  x 1 month  PERTINENT  PMH / PSH: PMHx reviewed  OBJECTIVE:   BP 127/79    Pulse 68    Ht 5\' 7"  (1.702 m)    Wt 215 lb 6 oz (97.7 kg)    SpO2 100%    BMI 33.73 kg/m   Physical Exam Vitals and nursing note reviewed.  Cardiovascular:     Rate and Rhythm: Normal rate and regular rhythm.     Pulses: Normal pulses.     Heart sounds: Normal heart sounds. No murmur heard. Pulmonary:     Effort: Pulmonary effort is normal. No respiratory distress.     Breath sounds: Normal breath sounds. No wheezing.     ASSESSMENT/PLAN:   HYPERTENSION, BENIGN SYSTEMIC Off Lisinopril for 1 month. I discussed starting Lisinopril at a lower dose of 2.5 mg QD for renal protection. BP parameters given. If BP is consistently less than 120/70 or in the 100/60 range to switch Lisinopril 2.5 mg to every other day. She agreed with the plan. Med refilled.  Hyperglycemia FLP checked today. LDL goal <70 I will contact her with result and discussed initiating Statins if needed. She agreed with the plan.   Diabetes mellitus ([place]) A1C checked and looks good on currently medication. However, her cravings for eating is increasing and I believe it is okay to go up on her ozempic to avoid worsening DM from poor diet control. May go up to 2 mg weekly pending lab work.  Morbid obesity ([place] [address]) Continue diet and exercise as tolerated. Increase Ozempic to 2 mg weekly pending lab result. Cmet checked today. I will contact her with results soon.     Andrena Mews, MD [place]

## 2021-08-16 NOTE — Assessment & Plan Note (Signed)
A1C checked and looks good on currently medication. However, her cravings for eating is increasing and I believe it is okay to go up on her ozempic to avoid worsening DM from poor diet control. May go up to 2 mg weekly pending lab work.

## 2021-08-16 NOTE — Assessment & Plan Note (Signed)
Continue diet and exercise as tolerated. Increase Ozempic to 2 mg weekly pending lab result. Cmet checked today. I will contact her with results soon.

## 2021-08-16 NOTE — Assessment & Plan Note (Signed)
Off Lisinopril for 1 month. I discussed starting Lisinopril at a lower dose of 2.5 mg QD for renal protection. BP parameters given. If BP is consistently less than 120/70 or in the 100/60 range to switch Lisinopril 2.5 mg to every other day. She agreed with the plan. Med refilled.

## 2021-08-16 NOTE — Assessment & Plan Note (Signed)
FLP checked today. LDL goal <70 I will contact her with result and discussed initiating Statins if needed. She agreed with the plan.

## 2021-08-17 ENCOUNTER — Encounter: Payer: Self-pay | Admitting: Family Medicine

## 2021-08-17 ENCOUNTER — Other Ambulatory Visit: Payer: Self-pay | Admitting: Family Medicine

## 2021-08-17 LAB — CMP14+EGFR
ALT: 10 IU/L (ref 0–32)
AST: 15 IU/L (ref 0–40)
Albumin/Globulin Ratio: 1.7 (ref 1.2–2.2)
Albumin: 4 g/dL (ref 3.8–4.9)
Alkaline Phosphatase: 77 IU/L (ref 44–121)
BUN/Creatinine Ratio: 14 (ref 9–23)
BUN: 13 mg/dL (ref 6–24)
Bilirubin Total: 0.5 mg/dL (ref 0.0–1.2)
CO2: 27 mmol/L (ref 20–29)
Calcium: 9 mg/dL (ref 8.7–10.2)
Chloride: 107 mmol/L — ABNORMAL HIGH (ref 96–106)
Creatinine, Ser: 0.91 mg/dL (ref 0.57–1.00)
Globulin, Total: 2.4 g/dL (ref 1.5–4.5)
Glucose: 85 mg/dL (ref 70–99)
Potassium: 4.5 mmol/L (ref 3.5–5.2)
Sodium: 144 mmol/L (ref 134–144)
Total Protein: 6.4 g/dL (ref 6.0–8.5)
eGFR: 73 mL/min/{1.73_m2} (ref >59)

## 2021-08-17 LAB — LIPID PANEL
Chol/HDL Ratio: 3.1 ratio (ref 0.0–4.4)
Cholesterol, Total: 153 mg/dL (ref 100–199)
HDL: 50 mg/dL (ref >39)
LDL Chol Calc (NIH): 92 mg/dL (ref 0–99)
Triglycerides: 55 mg/dL (ref 0–149)
VLDL Cholesterol Cal: 11 mg/dL (ref 5–40)

## 2021-08-17 MED ORDER — OZEMPIC (2 MG/DOSE) 8 MG/3ML ~~LOC~~ SOPN: 2.0000 mg | PEN_INJECTOR | SUBCUTANEOUS | 1 refills | Status: DC

## 2021-08-18 ENCOUNTER — Other Ambulatory Visit: Payer: Self-pay | Admitting: Family Medicine

## 2021-08-18 DIAGNOSIS — Z111 Encounter for screening for respiratory tuberculosis: Secondary | ICD-10-CM | POA: Diagnosis not present

## 2021-08-18 MED ORDER — ROSUVASTATIN CALCIUM 10 MG PO TABS: ORAL_TABLET | ORAL | 3 refills | Status: DC

## 2021-08-28 DIAGNOSIS — Z947 Corneal transplant status: Secondary | ICD-10-CM | POA: Diagnosis not present

## 2021-08-28 DIAGNOSIS — H40003 Preglaucoma, unspecified, bilateral: Secondary | ICD-10-CM | POA: Diagnosis not present

## 2021-08-28 DIAGNOSIS — Z961 Presence of intraocular lens: Secondary | ICD-10-CM | POA: Diagnosis not present

## 2021-08-28 DIAGNOSIS — H18512 Endothelial corneal dystrophy, left eye: Secondary | ICD-10-CM | POA: Diagnosis not present

## 2021-09-03 ENCOUNTER — Encounter: Payer: Self-pay | Admitting: Gastroenterology

## 2021-09-20 ENCOUNTER — Other Ambulatory Visit: Payer: Self-pay | Admitting: Family Medicine

## 2021-09-20 DIAGNOSIS — M654 Radial styloid tenosynovitis [de Quervain]: Secondary | ICD-10-CM

## 2021-10-09 ENCOUNTER — Ambulatory Visit (INDEPENDENT_AMBULATORY_CARE_PROVIDER_SITE_OTHER): Payer: BC Managed Care – PPO | Admitting: Internal Medicine

## 2021-10-09 ENCOUNTER — Encounter: Payer: Self-pay | Admitting: Internal Medicine

## 2021-10-09 VITALS — BP 130/84 | HR 69 | Ht 67.0 in | Wt 216.6 lb

## 2021-10-09 DIAGNOSIS — E042 Nontoxic multinodular goiter: Secondary | ICD-10-CM | POA: Diagnosis not present

## 2021-10-09 DIAGNOSIS — E0510 Thyrotoxicosis with toxic single thyroid nodule without thyrotoxic crisis or storm: Secondary | ICD-10-CM

## 2021-10-09 DIAGNOSIS — Z961 Presence of intraocular lens: Secondary | ICD-10-CM | POA: Diagnosis not present

## 2021-10-09 DIAGNOSIS — E0590 Thyrotoxicosis, unspecified without thyrotoxic crisis or storm: Secondary | ICD-10-CM

## 2021-10-09 DIAGNOSIS — H40003 Preglaucoma, unspecified, bilateral: Secondary | ICD-10-CM | POA: Diagnosis not present

## 2021-10-09 DIAGNOSIS — H18512 Endothelial corneal dystrophy, left eye: Secondary | ICD-10-CM | POA: Diagnosis not present

## 2021-10-09 DIAGNOSIS — Z947 Corneal transplant status: Secondary | ICD-10-CM | POA: Diagnosis not present

## 2021-10-09 LAB — T3, FREE: T3, Free: 3.3 pg/mL (ref 2.3–4.2)

## 2021-10-09 LAB — T4, FREE: Free T4: 0.81 ng/dL (ref 0.60–1.60)

## 2021-10-09 LAB — TSH: TSH: 0.44 u[IU]/mL (ref 0.35–5.50)

## 2021-10-09 NOTE — Patient Instructions (Signed)
Please stop at the lab. ? ?Please come back for a follow-up appointment in 1 year but for labs in 6 months. ? ?

## 2021-10-09 NOTE — Progress Notes (Signed)
Patient ID: Cynthia Wilson, female   DOB: 07/27/1965, 56 y.o.   MRNo.   MRN: [id number] ? ?This visit occurred during the SARS-CoV-2 public health emergency.  Safety protocols were in place, including screening questions prior to the visit, additional usage of staff PPE, and extensive cleaning of exam room while observing appropriate contact time as indicated for disinfecting solutions.  ? ?HPI  ?Cynthia Wilson is a 56 y.o.-year-old femalemale, referred by her PCP, Dr. Gwendlyn Deutscher, in consultation for subclinical thyrotoxicosis and goiter.  Last visit 6 months ago. ? ?Interim history: ?Before last visit, she opted not to go through with RAI treatment. ?She denies tremors, palpitations, weight loss, anxiety. She does have hot flushes - chronic. ?She has constipation - on Linzess. She is on Ozempic. ? ?Reviewed and addended history: ?She was diagnosed with a multinodular goiter in 2004. ? ?I reviewed her TFTs: ?Lab Results  ?Component Value Date  ? TSH 0.14 (L) 04/08/2021  ? TSH 0.16 (L) 09/26/2020  ? TSH 0.192 (L) 07/31/2020  ? TSH 0.496 04/28/2018  ? TSH 0.22 (L) 07/25/2016  ? TSH 0.69 07/25/2014  ? TSH 0.268 (L) 07/07/2014  ? TSH 0.523 03/09/2012  ? FREET4 0.76 04/08/2021  ? FREET4 0.81 09/26/2020  ? FREET4 0.9 07/25/2016  ? FREET4 0.77 07/25/2014  ? FREET4 1.00 07/07/2014  ?TPO antibodies were normal. ? ?Graves' antibodies were not elevated: ?Lab Results  ?Component Value Date  ? TSI <89 09/26/2020  ? ?Previous investigation: ?She had a Thyroid scan in 2004: ?1.  NORMAL RIGHT LOBE OF THE THYROID. ?2.  THREE COLD NODULES OF THE LEFT LOBE.   ? ?She had a thyroid U/S (04/08/2003): ?1. DOMINANT 3.3 CM IN DIAMETER COMPLEX MASS CONTAINING CYSTIC AND SOLID COMPONENTS INVOLVING THE MAJORITY OF THE MID AND LOWER POLE OF THE LEFT LOBE OF THYROID CORRESPONDING TO THE COLD NODULE SEEN IN THIS REGION ON NUCLEAR MEDICINE STUDY.   ?2. THERE IS A SECOND 1.6 CM SIZE COMPLEX NODULE ASSOCIATED IN THE LOWER POLE OF THE LEFT LOBE OF  THYROID.  ?3. SMALLER (6 AND 8 MM IN SIZE) NODULES WITHIN THE MID RIGHT LOBE OF THE THYROID. ? ?L Thyroid nodule FNA (05/10/2003): The morphologic features are most consistent with a colloid nodule.  ? ?Thyroid U/S at [facility] (05/29/2014):  ?Several R nodules:1.4 cm (heterogeneous) and 1.1 cm (hypoechoic), vascular, nodules and 3 subcm nodules ?3 L nodules: 2.1 cm (hypervascular), the rest smaller: 1.5 and 0.7 cm hypoechoic nodules. ? ?Thyroid U/S (08/07/2016): ?Parenchymal Echotexture: Mildly heterogenous ?Isthmus: Measures 0.3 cm in the AP dimension. ?Right lobe: 5.3 x 1.6 x 1.7 cm ?Left lobe: 5.5 x 1.6 x 2.1 cm ?_________________________________________________________ ?   ?Nodule # 1: ?Location: Right; Mid, pregnancy was increased from a maximum URI ?Maximum size: 1.3 cm; Other 2 dimensions: 0.8 x 0.9 cm ?Composition: solid/almost completely solid (2)  ?Echogenicity: hypoechoic (2) ?ACR TI-RADS total points: 4. ?*Given size (>/= 1 - 1.4 cm) and appearance, a follow-up ultrasound ?in 1 year should be considered based on TI-RADS criteria. ?  ?Nodule # 2: ?Location: Right; Mid ?Maximum size: 1.5 cm; Other 2 dimensions: 0.8 x 1.0 cm ?Composition: solid/almost completely solid (2)  ?Echogenicity: hypoechoic (2) ?Echogenic foci: punctate echogenic foci (3) ?ACR TI-RADS total points: 7. ? **Given size (>/= 1.0 cm) and appearance, fine needle aspiration of ?this highly suspicious nodule should be considered based on TI-RADS ?criteria. ?  ?Multiple additional small nodules throughout the right thyroid lobe. ?There are 2 cysts in the right inferior  thyroid, largest measuring ?up to 0.7 cm. ?______________________________________________________________ ?  ?Dominant mildly hypoechoic solid nodule in the mid left thyroid lobe ?that measures 2.3 x 1.3 x 1.6 cm. By report, this previously ?measured 3.3 x 1.5 x 2.1 cm. This appears to represent the ?previously biopsied left thyroid nodule. Few small areas of  cystic ?degeneration within this nodule. There are questionable echogenic ?foci throughout this nodule. ?  ?Nodule # 3: ?Location: Left; Inferior ?Maximum size: 1.5 cm; Other 2 dimensions: 0.7 x 1.1 cm. By report, ?there was a nodule in the inferior left thyroid lobe that measured ?1.6 x 0.9 x 1.0 cm. ?Composition: solid/almost completely solid (2) ?Echogenicity: hypoechoic (2) ?Shape: not taller-than-wide (0) ?Margins: lobulated/irregular (2) ?Echogenic foci: macrocalcifications (1) ?ACR TI-RADS total points: 7. ? **Given size (>/= 1.0 cm) and appearance, fine needle aspiration of ?this highly suspicious nodule should be considered based on TI-RADS ?criteria. ?  ?IMPRESSION: ?Multiple thyroid nodules. ?  ?The dominant nodule in left thyroid lobe measures up to 2.3 cm and ?appears to represent the previously biopsied nodule. ?  ?Nodule in the inferior left thyroid lobe (Nodule #3) and a nodule in ?the right mid thyroid lobe (Nodule #2) both meet criteria for ?biopsy. ?  ?Nodule #1 in the mid right thyroid lobe meets criteria for 1 year ?follow-up. ? ? ? ?FNA of the dominant nodules (09/10/2017): ?Adequacy Reason ?UNSATISFACTORY for evaluation due to extremely scant cellularity. The specimen is processed and examined ?microscopically, but is found to be unsatisfactory for evaluation of an epithelial abnormality. Repeat study ?recommended. ?Diagnosis ?THYROID, FINE NEEDLE ASPIRATION LEFT LOWER POLE (SPECIMEN 1 OF 2 COLLECTED 09/10/2016) ?BLOOD. NO FOLLICULAR CELLULARITY PRESENT. ?Claudette Laws MD ?Pathologist, Electronic Signature ?(Case signed 09/11/2016) ?Specimen Clinical Information ?Previous Biopsy today and 05/11/2003), Nodule 3: Left Inferior 1.5 cm; Other 2 dimensions: 0.7 x 1.1 cm., By report ?there was nodule in the inferior left thyroid lobe that measured 1.6 x 0.9 x 1.0 cm., Hypoechoic, ACR TI- RADS total ?points:7, Highly suspicious nodule ? ?Adequacy Reason ?Satisfactory For Evaluation. ?Diagnosis ?THYROID,  FINE NEEDLE ASPIRATION, RMP (SPECIMEN 2 OF 2, COLLECTED 09/10/16): ?CONSISTENT WITH BENIGN FOLLICULAR NODULE ([place] CATEGORY II). ?Claudette Laws MD ?Pathologist, Electronic Signature ?(Case signed 09/11/2016) ?Specimen Clinical Information ?Today and11/05/2003, Nodule 2: Right Mid 1.5 cm; Other 2 dimensions: 0.8 x 1.0cm solid /almost completely solid , ?Hypoechoic, ACR TI - RADS total points: 7, Highly suspicious nodule ?Source ?Thyroid, Fine Needle Aspiration, Rt Lobe RMP, (Specimen 2of 2, collected on 09/10/16 ) ? ?Thyroid uptake and scan (10/25/2020): Low uptake in left hot nodule versus toxic multinodular goiter: ?Multiple bilateral nodules. Gland normal size. One warm nodule in the upper pole of the LEFT lobe. ?4 hour I-123 uptake = 4.4% (normal 5-20%) ?24 hour I-123 uptake = 13.0% (normal 10-30%) ?  ?IMPRESSION: ?1. Multinodular gland. ?2. Low I 131 uptake. ?3. With depressed TSH, findings most suggestive of toxic multinodular goiter versus versus autonomous nodule in the upper pole LEFT thyroid gland. ? ?Pt denies: ?- feeling nodules in neck ?- hoarseness ?- dysphagia ?- choking ?- SOB with lying down ? ?No FH of thyroid disease or of thyroid cancer. No h/o radiation tx to head or neck. ?No herbal supplements. No Biotin use. No recent steroids use.  ? ?She also has a history of HTN, GERD, DM2. ? ?She has mild prediabetes.  Review latest HbA1c levels: ?Lab Results  ?Component Value Date  ? HGBA1C 5.7 08/16/2021  ? HGBA1C 5.8 03/26/2021  ? HGBA1C 5.8 (A) 07/31/2020  ?  HGBA1C 6.2 02/14/2020  ? HGBA1C 5.9 04/26/2019  ? HGBA1C 6.4 11/17/2018  ? HGBA1C 6.1 04/06/2018  ? HGBA1C 6.0 12/01/2017  ? HGBA1C 6.2 09/21/2017  ? HGBA1C 6.1 03/24/2017  ?He is on Ozempic 2 mg weekly. ? 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 ? ?ROS: ?+ see HPI ? ?I reviewed pt's medications, allergies, PMH, social hx, family hx: ? ?Past Medical History:  ?Diagnosis Date  ? Allergy   ? Arthritis   ? GERD (gastroesophageal  reflux disease)   ? Heart murmur   ? Heart murmur 02/01/2013  ? History of osteoarthritis   ? left knee  ? Hypertension   ? under control with med., has been on med. x 5 yr.  ? Immature cataract 2021  ? removed bilate

## 2021-10-10 ENCOUNTER — Encounter: Payer: Self-pay | Admitting: Internal Medicine

## 2021-11-13 DIAGNOSIS — Z947 Corneal transplant status: Secondary | ICD-10-CM | POA: Diagnosis not present

## 2021-11-13 DIAGNOSIS — H40003 Preglaucoma, unspecified, bilateral: Secondary | ICD-10-CM | POA: Diagnosis not present

## 2021-11-13 DIAGNOSIS — Z961 Presence of intraocular lens: Secondary | ICD-10-CM | POA: Diagnosis not present

## 2021-11-13 DIAGNOSIS — H18512 Endothelial corneal dystrophy, left eye: Secondary | ICD-10-CM | POA: Diagnosis not present

## 2021-12-03 ENCOUNTER — Encounter: Payer: Self-pay | Admitting: *Deleted

## 2021-12-09 ENCOUNTER — Other Ambulatory Visit: Payer: Self-pay | Admitting: Family Medicine

## 2021-12-20 ENCOUNTER — Encounter: Payer: Self-pay | Admitting: Family Medicine

## 2021-12-20 ENCOUNTER — Ambulatory Visit (INDEPENDENT_AMBULATORY_CARE_PROVIDER_SITE_OTHER): Payer: BC Managed Care – PPO | Admitting: Family Medicine

## 2021-12-20 VITALS — BP 140/82 | HR 66 | Ht 67.0 in | Wt 206.6 lb

## 2021-12-20 DIAGNOSIS — K5900 Constipation, unspecified: Secondary | ICD-10-CM

## 2021-12-20 DIAGNOSIS — R1032 Left lower quadrant pain: Secondary | ICD-10-CM

## 2021-12-20 DIAGNOSIS — I10 Essential (primary) hypertension: Secondary | ICD-10-CM | POA: Diagnosis not present

## 2021-12-20 DIAGNOSIS — E1169 Type 2 diabetes mellitus with other specified complication: Secondary | ICD-10-CM

## 2021-12-20 LAB — POCT GLYCOSYLATED HEMOGLOBIN (HGB A1C): HbA1c, POC (controlled diabetic range): 5.8 % (ref 0.0–7.0)

## 2021-12-20 NOTE — Assessment & Plan Note (Signed)
A1C looks good today. Continue current dose of Ozempic

## 2021-12-20 NOTE — Assessment & Plan Note (Signed)
BP elevated. She omitted her AM dose of antihypertensive today. No medication adjustment at this time. I will reassess at her next visit.

## 2021-12-20 NOTE — Assessment & Plan Note (Signed)
She had lost 10 pounds since her last visit two months ago. Unfortunately, she is maxed-out on the dose of her Ozempic. We discussed switching to [facility]. She declined at this time. Continue Ozempic in addition to diet and exercise control.

## 2021-12-22 LAB — MICROALBUMIN / CREATININE URINE RATIO
Creatinine, Urine: 384.4 mg/dL
Microalb/Creat Ratio: 4 mg/g creat (ref 0–29)
Microalbumin, Urine: 17.2 ug/mL

## 2022-01-23 ENCOUNTER — Encounter: Payer: Self-pay | Admitting: Family Medicine

## 2022-01-27 ENCOUNTER — Other Ambulatory Visit: Payer: Self-pay | Admitting: Family Medicine

## 2022-01-27 MED ORDER — TIRZEPATIDE 10 MG/0.5ML ~~LOC~~ SOAJ: 10.0000 mg | SUBCUTANEOUS | 1 refills | Status: DC

## 2022-01-29 ENCOUNTER — Other Ambulatory Visit: Payer: Self-pay | Admitting: Family Medicine

## 2022-01-29 DIAGNOSIS — Z1231 Encounter for screening mammogram for malignant neoplasm of breast: Secondary | ICD-10-CM

## 2022-02-06 ENCOUNTER — Encounter: Payer: Self-pay | Admitting: Family Medicine

## 2022-02-17 DIAGNOSIS — M25561 Pain in right knee: Secondary | ICD-10-CM | POA: Diagnosis not present

## 2022-02-17 DIAGNOSIS — M1711 Unilateral primary osteoarthritis, right knee: Secondary | ICD-10-CM | POA: Diagnosis not present

## 2022-02-17 DIAGNOSIS — M25562 Pain in left knee: Secondary | ICD-10-CM | POA: Diagnosis not present

## 2022-02-17 DIAGNOSIS — Z96652 Presence of left artificial knee joint: Secondary | ICD-10-CM | POA: Diagnosis not present

## 2022-02-18 ENCOUNTER — Ambulatory Visit (INDEPENDENT_AMBULATORY_CARE_PROVIDER_SITE_OTHER): Payer: BC Managed Care – PPO | Admitting: Family Medicine

## 2022-02-18 ENCOUNTER — Encounter: Payer: Self-pay | Admitting: Family Medicine

## 2022-02-18 DIAGNOSIS — I10 Essential (primary) hypertension: Secondary | ICD-10-CM | POA: Diagnosis not present

## 2022-02-18 DIAGNOSIS — E119 Type 2 diabetes mellitus without complications: Secondary | ICD-10-CM | POA: Diagnosis not present

## 2022-02-18 DIAGNOSIS — J309 Allergic rhinitis, unspecified: Secondary | ICD-10-CM

## 2022-02-18 MED ORDER — TIRZEPATIDE 12.5 MG/0.5ML ~~LOC~~ SOAJ: 12.5000 mg | SUBCUTANEOUS | 1 refills | Status: DC

## 2022-02-18 MED ORDER — LISINOPRIL 5 MG PO TABS
5.0000 mg | ORAL_TABLET | Freq: Every day | ORAL | 1 refills | Status: DC
Start: 2022-02-18 — End: 2022-03-14

## 2022-02-18 NOTE — Assessment & Plan Note (Signed)
Likely related to her ear discomfort. Ear exam benign. Continue Mucinex and Loratadine. F/U as needed.

## 2022-02-18 NOTE — Assessment & Plan Note (Signed)
Increased Lisinopril to 5 mg QD Start monitoring BP at home. F/U in 4 weeks.

## 2022-02-18 NOTE — Patient Instructions (Addendum)
Pelvic Pain, Female Pelvic pain is pain in your lower belly (abdomen), below your belly button and between your hips. The pain may: Start all of a sudden (be acute). Keep coming back (be recurring). Last a long time (become chronic). Pelvic pain that lasts longer than 6 months is called chronic pelvic pain. There are many causes of pelvic pain. Sometimes the cause of pelvic pain is not known. Follow these instructions at home:  Take over-the-counter and prescription medicines only as told by your doctor. Rest as told by your doctor. Do not have sex if it hurts. Keep a journal of your pelvic pain. Write down: When the pain started. Where the pain is located. What seems to make the pain better or worse, such as food or your monthly period (menstrual cycle). Any symptoms you have along with the pain. Keep all follow-up visits. Contact a doctor if: Medicine does not help your pain, or your pain comes back. You have new symptoms. You have unusual discharge or bleeding from your vagina. You have a fever or chills. You are having trouble pooping (constipation). You have blood in your pee (urine) or poop (stool). Your pee smells bad. You feel weak or light-headed. Get help right away if: You have sudden pain that is very bad. You have very bad pain and also have any of these symptoms: A fever. Feeling like you may vomit (nauseous). Vomiting. Being very sweaty. You faint. These symptoms may be an emergency. Get help right away. Call your local emergency services (911 in the [place] Do not wait to see if the symptoms will go away. Do not drive yourself to the hospital. Summary Pelvic pain is pain in your lower belly (abdomen), below your belly button and between your hips. There are many causes of pelvic pain. Keep a journal of your pelvic pain. This information is not intended to replace advice given to you by your health care provider. Make sure you discuss any questions you have with  your health care provider. Document Revised: 10/23/2020 Document Reviewed: 10/23/2020 Elsevier Patient Education  [place].

## 2022-02-18 NOTE — Progress Notes (Signed)
    SUBJECTIVE:   CHIEF COMPLAINT / HPI:   HTN:  She was recently started on low-dose Lisinopril for renal protection, and had been on antihypertensive before then in the past. She denies any concerns. Here for f/u.  DM: Recently started [facility]. She is here for f/u.  Pelvic pain:  C/O intermittent left groin pain, which has been ongoing for 2 months. Pain is about 3/10 in severity, and she does not need any pain medications for this.  She had similar pain years ago when diagnosed with an ovarian cyst. No other concerns.   Sinus/ear discomfort:  C/O sinus drainage for a few days, for which she uses Loratadine and Mucinex with some improvement. However, she has been having a fullness sensation in her left ear and wants to get it checked. She denies any hearing loss or drainage.   PERTINENT  PMH / PSH: PMHx reviewed  OBJECTIVE:   Vitals:   02/18/22 0834 02/18/22 0848  BP: (!) 150/91 (!) 160/82  Pulse: 72 69  Weight: 197 lb 3.2 oz (89.4 kg)     Physical Exam Vitals and nursing note reviewed.  HENT:     Right Ear: Tympanic membrane, ear canal and external ear normal.     Left Ear: Tympanic membrane, ear canal and external ear normal.  Cardiovascular:     Rate and Rhythm: Normal rate and regular rhythm.     Pulses: Normal pulses.     Heart sounds: No murmur heard. Pulmonary:     Effort: Pulmonary effort is normal. No respiratory distress.     Breath sounds: Normal breath sounds. No wheezing.  Abdominal:     General: Abdomen is flat. Bowel sounds are normal. There is no distension.     Palpations: Abdomen is soft. There is no mass.     Tenderness: There is no abdominal tenderness.      ASSESSMENT/PLAN:   HYPERTENSION, BENIGN SYSTEMIC Increased Lisinopril to 5 mg QD Start monitoring BP at home. F/U in 4 weeks.   Diabetes mellitus ([place]) Tolerating Mounjaro. Increased dose to 12.5 mg weekly today. Monitor closely.  Allergic rhinitis Likely related to her ear  discomfort. Ear exam benign. Continue Mucinex and Loratadine. F/U as needed.     Andrena Mews, MD [place]

## 2022-02-18 NOTE — Assessment & Plan Note (Signed)
Tolerating Mounjaro. Increased dose to 12.5 mg weekly today. Monitor closely.

## 2022-02-20 DIAGNOSIS — H18512 Endothelial corneal dystrophy, left eye: Secondary | ICD-10-CM | POA: Diagnosis not present

## 2022-02-28 ENCOUNTER — Ambulatory Visit: Payer: BC Managed Care – PPO

## 2022-03-13 ENCOUNTER — Other Ambulatory Visit: Payer: Self-pay | Admitting: Family Medicine

## 2022-03-21 ENCOUNTER — Encounter: Payer: Self-pay | Admitting: Family Medicine

## 2022-03-21 ENCOUNTER — Ambulatory Visit (INDEPENDENT_AMBULATORY_CARE_PROVIDER_SITE_OTHER): Payer: BC Managed Care – PPO | Admitting: Family Medicine

## 2022-03-21 VITALS — BP 134/79 | HR 68 | Ht 67.0 in | Wt 194.2 lb

## 2022-03-21 DIAGNOSIS — I10 Essential (primary) hypertension: Secondary | ICD-10-CM | POA: Diagnosis not present

## 2022-03-21 DIAGNOSIS — E119 Type 2 diabetes mellitus without complications: Secondary | ICD-10-CM | POA: Diagnosis not present

## 2022-03-21 DIAGNOSIS — Z23 Encounter for immunization: Secondary | ICD-10-CM | POA: Diagnosis not present

## 2022-03-21 MED ORDER — TIRZEPATIDE 15 MG/0.5ML ~~LOC~~ SOAJ: 15.0000 mg | SUBCUTANEOUS | 1 refills | Status: DC

## 2022-03-21 NOTE — Progress Notes (Signed)
    SUBJECTIVE:   CHIEF COMPLAINT / HPI:   HTN: She is compliant with Lisinopril 5 mg QD. She checked BP at home on and off and it has been good since she increased her Lisinopril to 5 mg QD.  DM2/Weight management: Compliant with her Mounjaro. No new concerns. She is here for f/u.  PERTINENT  PMH / PSH: PMHx reviewed  OBJECTIVE:   Vitals:   03/21/22 0927 03/21/22 0942  BP: 137/75 134/79  Pulse: 68   SpO2: 100%   Weight: 194 lb 3.2 oz (88.1 kg)   Height: '5\' 7"'$  (1.702 m)     Physical Exam Vitals and nursing note reviewed.  Cardiovascular:     Rate and Rhythm: Normal rate and regular rhythm.     Heart sounds: Normal heart sounds. No murmur heard. Pulmonary:     Effort: Pulmonary effort is normal. No respiratory distress.     Breath sounds: Normal breath sounds. No wheezing.  Neurological:     Mental Status: She is alert.      ASSESSMENT/PLAN:   HYPERTENSION, BENIGN SYSTEMIC BBP looks good on her current regimen, continue same.   Diabetes mellitus ([place]) Compliant with Mounjaro Increased dose to 15 mg weekly today. F/U soon if unable to tolerate a higher dose. Repeat A1C in 2 months. She agreed with the plan.   Morbid obesity ([place]) Improving on [facility]   Health maintenance: Flu shot given today.  Andrena Mews, MD [place]

## 2022-03-21 NOTE — Patient Instructions (Signed)
It was nice seeing you today. Your BP looks good. We will continue your current dose of Lisinopril. For your DM and weight management, we will increase your Moujaro to a higher dose. Please, contact me soon if you have intolerance to any of your medications.[name]

## 2022-03-21 NOTE — Assessment & Plan Note (Signed)
Compliant with Mounjaro Increased dose to 15 mg weekly today. F/U soon if unable to tolerate a higher dose. Repeat A1C in 2 months. She agreed with the plan.

## 2022-03-21 NOTE — Assessment & Plan Note (Signed)
Improving on [facility]

## 2022-03-21 NOTE — Assessment & Plan Note (Signed)
BBP looks good on her current regimen, continue same.

## 2022-03-25 ENCOUNTER — Ambulatory Visit: Payer: BC Managed Care – PPO

## 2022-03-27 ENCOUNTER — Other Ambulatory Visit: Payer: Self-pay | Admitting: Family Medicine

## 2022-03-28 ENCOUNTER — Encounter: Payer: Self-pay | Admitting: Family Medicine

## 2022-03-28 NOTE — Telephone Encounter (Signed)
I called and discussed with the patient. I did send in 15 mg of Mounjaro. She might have picked up the old prescription. She will call her pharmacy for replacement.

## 2022-04-08 ENCOUNTER — Other Ambulatory Visit (INDEPENDENT_AMBULATORY_CARE_PROVIDER_SITE_OTHER): Payer: BC Managed Care – PPO

## 2022-04-08 DIAGNOSIS — E0510 Thyrotoxicosis with toxic single thyroid nodule without thyrotoxic crisis or storm: Secondary | ICD-10-CM | POA: Diagnosis not present

## 2022-04-08 DIAGNOSIS — E042 Nontoxic multinodular goiter: Secondary | ICD-10-CM | POA: Diagnosis not present

## 2022-04-08 LAB — T4, FREE: Free T4: 0.84 ng/dL (ref 0.60–1.60)

## 2022-04-08 LAB — T3, FREE: T3, Free: 3.5 pg/mL (ref 2.3–4.2)

## 2022-04-08 LAB — TSH: TSH: 0.28 u[IU]/mL — ABNORMAL LOW (ref 0.35–5.50)

## 2022-04-17 ENCOUNTER — Ambulatory Visit: Payer: BC Managed Care – PPO

## 2022-04-17 ENCOUNTER — Ambulatory Visit
Admission: RE | Admit: 2022-04-17 | Discharge: 2022-04-17 | Disposition: A | Discharge status: Home / Self Care | Payer: BC Managed Care – PPO | Source: Ambulatory Visit | Attending: Family Medicine | Admitting: Family Medicine

## 2022-04-17 DIAGNOSIS — Z1231 Encounter for screening mammogram for malignant neoplasm of breast: Secondary | ICD-10-CM | POA: Diagnosis not present

## 2022-04-20 ENCOUNTER — Other Ambulatory Visit: Payer: Self-pay | Admitting: Family Medicine

## 2022-04-21 ENCOUNTER — Other Ambulatory Visit: Payer: Self-pay | Admitting: Family Medicine

## 2022-04-21 MED ORDER — TIRZEPATIDE 15 MG/0.5ML ~~LOC~~ SOAJ: 15.0000 mg | SUBCUTANEOUS | 1 refills | Status: DC

## 2022-04-23 ENCOUNTER — Other Ambulatory Visit: Payer: Self-pay | Admitting: Family Medicine

## 2022-04-23 DIAGNOSIS — R0982 Postnasal drip: Secondary | ICD-10-CM

## 2022-04-29 ENCOUNTER — Telehealth: Payer: Self-pay

## 2022-04-29 NOTE — Patient Outreach (Signed)
  Care Coordination   Initial Visit Note   04/29/2022 Name: Kendra Grissett MRN: [id number] DOB: [birth year]  Melford Aase Chacko is a 56 y.o. year old female who sees Kinnie Feil, MD for primary care. I spoke with  [name] by phone today.  What matters to the patients health and wellness today?  none    Goals Addressed             This Visit's Progress    COMPLETED: Care Coordination Activities-No follow up required       Care Coordination Interventions: Advised patient to schedule annual wellness visit          SDOH assessments and interventions completed:  Yes     Care Coordination Interventions Activated:  Yes  Care Coordination Interventions:  Yes, provided   Follow up plan: No further intervention required.   Encounter Outcome:  Pt. Visit Completed   Jone Baseman, RN, MSN [place] [facility] Care Management Coordinator Direct Line [phone]

## 2022-04-29 NOTE — Patient Outreach (Signed)
  Care Coordination   04/29/2022 Name: Cynthia Wilson MRN: [id number] DOB: 1966-04-06   Care Coordination Outreach Attempts:  An unsuccessful telephone outreach was attempted today to offer the patient information about available care coordination services as a benefit of their health plan.   Follow Up Plan:  Additional outreach attempts will be made to offer the patient care coordination information and services.   Encounter Outcome:  Pt. Request to Call Back  Care Coordination Interventions Activated:  No   Care Coordination Interventions:  No, not indicated    Jone Baseman, RN, MSN [place] [facility] Care Management Coordinator Direct Line [phone]

## 2022-04-29 NOTE — Patient Instructions (Signed)
Visit Information  Thank you for taking time to visit with me today. Please don't hesitate to contact me if I can be of assistance to you.   Following are the goals we discussed today:   Goals Addressed             This Visit's Progress    COMPLETED: Care Coordination Activities-No follow up required       Care Coordination Interventions: Advised patient to schedule annual wellness visit         If you are experiencing a Mental Health or [place] or need someone to talk to, please call the Suicide and Crisis Lifeline: 988   Patient verbalizes understanding of instructions and care plan provided today and agrees to view in [place]. Active MyChart status and patient understanding of how to access instructions and care plan via MyChart confirmed with patient.     No further follow up required: patient decline  Jone Baseman, RN, MSN [place] [facility] Care Management Coordinator Direct Line [phone]

## 2022-05-29 DIAGNOSIS — M1711 Unilateral primary osteoarthritis, right knee: Secondary | ICD-10-CM | POA: Diagnosis not present

## 2022-05-29 DIAGNOSIS — M25561 Pain in right knee: Secondary | ICD-10-CM | POA: Diagnosis not present

## 2022-06-10 DIAGNOSIS — M1711 Unilateral primary osteoarthritis, right knee: Secondary | ICD-10-CM | POA: Diagnosis not present

## 2022-06-17 DIAGNOSIS — M1711 Unilateral primary osteoarthritis, right knee: Secondary | ICD-10-CM | POA: Diagnosis not present

## 2022-06-24 ENCOUNTER — Other Ambulatory Visit: Payer: Self-pay | Admitting: Family Medicine

## 2022-06-24 DIAGNOSIS — M1711 Unilateral primary osteoarthritis, right knee: Secondary | ICD-10-CM | POA: Diagnosis not present

## 2022-06-26 ENCOUNTER — Telehealth: Payer: Self-pay

## 2022-06-26 ENCOUNTER — Encounter: Payer: Self-pay | Admitting: Family Medicine

## 2022-06-26 NOTE — Telephone Encounter (Signed)
Scheduled pt for appt tomorrow with Dr.Ganta '@9'$ :10 am

## 2022-06-26 NOTE — Telephone Encounter (Signed)
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   Symptom onset yesterday. She reports sore throat, cough, nasal congestion, headache, chills.   Denies body aches, fever.   Home COVID test was negative. However, she is unsure if this test has expired.   She is asking if antiviral can be sent to her pharmacy.   Advised that I would message provider for next steps.   Please advise if this can be sent in or if patient needs an appointment.   Talbot Grumbling, RN

## 2022-06-27 ENCOUNTER — Encounter: Payer: Self-pay | Admitting: Family Medicine

## 2022-06-27 ENCOUNTER — Ambulatory Visit (INDEPENDENT_AMBULATORY_CARE_PROVIDER_SITE_OTHER): Payer: BC Managed Care – PPO | Admitting: Family Medicine

## 2022-06-27 VITALS — BP 114/81 | HR 125 | Ht 67.0 in | Wt 178.4 lb

## 2022-06-27 DIAGNOSIS — R051 Acute cough: Secondary | ICD-10-CM

## 2022-06-27 DIAGNOSIS — J029 Acute pharyngitis, unspecified: Secondary | ICD-10-CM

## 2022-06-27 DIAGNOSIS — R059 Cough, unspecified: Secondary | ICD-10-CM | POA: Insufficient documentation

## 2022-06-27 NOTE — Progress Notes (Cosign Needed)
    SUBJECTIVE:   CHIEF COMPLAINT / HPI:   Patient presents requesting COVID testing per PCP. She developed a nonproductive, dry cough. Also endorsing congestion, sore throat and myalgias, she has been feeling weak for the past 2-3 days. Denies dyspnea or fever. Sick contacts include mother who recently was diagnosed with COVID. No other concerns today.  OBJECTIVE:   BP 114/81   Pulse (!) 125   Ht '5\' 7"'$  (1.702 m)   Wt 178 lb 6 oz (80.9 kg)   SpO2 99%   BMI 27.94 kg/m   General: Patient tired appearing and in no acute distress. HEENT: normal buccal mucosa, presence of anterior cervical LAD CV: RRR, no murmurs or gallops auscultated Resp: CTAB, no wheezing, rales or rhonchi noted, no evidence of focal findings with good movement throughout all lung fields, breathing comfortably on room air without signs of respiratory distress  ASSESSMENT/PLAN:   Cough -URI symptoms with COVID exposure, very possibly COVID. Likely viral etiology, no bacteria or other source noted. -COVID testing pending -conservative measures discussed and reassurance provided -return precautions discussed    -PHQ-9 score of 0 reviewed.  Donney Dice, [place]

## 2022-06-27 NOTE — Patient Instructions (Signed)
It was great seeing you today!  Today we discussed your symptoms, it seems this is due to a viral infection. Your COVID test is pending, we will let you know of the results when it returns. In the meantime, please stay hydrated and get plenty of rest. Honey can help with the cough. Using a humidifier can help with the congestion. You do not need antibiotics as this will not help you feel better since you have a viral infection.   Please follow up at your next scheduled appointment, if anything arises between now and then, please don't hesitate to contact our office.   Thank you for allowing [place] to be a part of your medical care!  Thank you, Dr. Larae Grooms  Also a reminder of our clinic's no-show policy. Please make sure to arrive at least 15 minutes prior to your scheduled appointment time. Please try to cancel before 24 hours if you are not able to make it. If you no-show for 2 appointments then you will be receiving a warning letter. If you no-show after 3 visits, then you may be at risk of being dismissed from our clinic. This is to ensure that everyone is able to be seen in a timely manner. Thank you, we appreciate your assistance with this!

## 2022-06-27 NOTE — Assessment & Plan Note (Signed)
-  URI symptoms with COVID exposure, very possibly COVID. Likely viral etiology, no bacteria or other source noted. -COVID testing pending -conservative measures discussed and reassurance provided -return precautions discussed

## 2022-06-30 LAB — COVID-19, FLU A+B AND RSV
Influenza A, NAA: NOT DETECTED
Influenza B, NAA: NOT DETECTED
RSV, NAA: NOT DETECTED
SARS-CoV-2, NAA: DETECTED — AB

## 2022-07-03 ENCOUNTER — Encounter: Payer: Self-pay | Admitting: Family Medicine

## 2022-07-07 DIAGNOSIS — Z947 Corneal transplant status: Secondary | ICD-10-CM | POA: Diagnosis not present

## 2022-07-07 DIAGNOSIS — H35371 Puckering of macula, right eye: Secondary | ICD-10-CM | POA: Diagnosis not present

## 2022-07-07 DIAGNOSIS — H40013 Open angle with borderline findings, low risk, bilateral: Secondary | ICD-10-CM | POA: Diagnosis not present

## 2022-07-07 LAB — HM DIABETES EYE EXAM

## 2022-07-09 ENCOUNTER — Encounter: Payer: Self-pay | Admitting: Family Medicine

## 2022-07-09 NOTE — Progress Notes (Signed)
Eye exam report from Dr. Herbert Deaner reviewed. Placed in the front office for all pages scanning.

## 2022-07-24 ENCOUNTER — Other Ambulatory Visit: Payer: Self-pay | Admitting: Family Medicine

## 2022-08-06 DIAGNOSIS — Z961 Presence of intraocular lens: Secondary | ICD-10-CM | POA: Diagnosis not present

## 2022-08-06 DIAGNOSIS — Z947 Corneal transplant status: Secondary | ICD-10-CM | POA: Diagnosis not present

## 2022-08-06 DIAGNOSIS — H40003 Preglaucoma, unspecified, bilateral: Secondary | ICD-10-CM | POA: Diagnosis not present

## 2022-08-18 ENCOUNTER — Telehealth: Payer: Self-pay | Admitting: *Deleted

## 2022-08-18 NOTE — Patient Outreach (Signed)
  Care Coordination   Initial Visit Note   08/18/2022 Name: Adira Trautwein MRN: [id number] DOB: Dec 19, 1965  Melford Aase Hartfield is a 57 y.o. year old female who sees Kinnie Feil, MD for primary care. I spoke with  [name] by phone today.  What matters to the patients health and wellness today?  Introduced pt to [place] program and she is interested/agreeable to intake call from [facility] 08/22/22.    Goals Addressed   None     SDOH assessments and interventions completed:  No     Care Coordination Interventions:  No, not indicated   Follow up plan: Follow up call scheduled for 08/22/22 with RNCM    Encounter Outcome:  Pt. Visit Completed

## 2022-08-22 ENCOUNTER — Ambulatory Visit: Payer: Self-pay

## 2022-08-22 NOTE — Patient Instructions (Signed)
Visit Information  Thank you for taking time to visit with me today. Please don't hesitate to contact me if I can be of assistance to you.   Following are the goals we discussed today:   Goals Addressed             This Visit's Progress    COMPLETED: Care Coordinaiton Activites- No follow up required        Care Coordination Interventions: Active listening / Reflection utilized  Emotional Support Provided Problem [place] strategies reviewed Discussed/.Educated Care Coordination Program 2   Discussed/.Educated Social Determinates of Health 3.   Please inform PCP if services needed in the future          If you are experiencing a Mental Health or [place] or need someone to talk to, please call 1-800-273-TALK (toll free, 24 hour hotline)  Patient verbalizes understanding of instructions and care plan provided today and agrees to view in [place]. Active MyChart status and patient understanding of how to access instructions and care plan via MyChart confirmed with patient.     Lazaro Arms RN, BSN, [place] [facility]   Phone: [phone]

## 2022-08-22 NOTE — Patient Outreach (Signed)
  Care Coordination   Initial Visit Note   08/22/2022 Name: Cynthia Wilson MRN: [id number] DOB: Oct 20, 1965  Cynthia Wilson is a 57 y.o. year old female who sees Cynthia Feil, MD for primary care. I spoke with  Cynthia Wilson by phone today.  What matters to the patients health and wellness today?  The patient has no questions or concerns regarding her health    Goals Addressed             This Visit's Progress    COMPLETED: Care Coordinaiton Activites- No follow up required        Care Coordination Interventions: Active listening / Reflection utilized  Emotional Support Provided Problem [place] strategies reviewed Discussed/.Educated Care Coordination Program 2   Discussed/.Educated Social Determinates of Health 3.   Please inform PCP if services needed in the future         SDOH assessments and interventions completed:  Yes  SDOH Interventions Today    Flowsheet Row Most Recent Value  SDOH Interventions   Food Insecurity Interventions Intervention Not Indicated  Transportation Interventions Intervention Not Indicated        Care Coordination Interventions:  Yes, provided   Interventions Today    Flowsheet Row Most Recent Value  General Interventions   General Interventions Discussed/Reviewed General Interventions Discussed  Education Interventions   Education Provided Provided Education        Follow up plan: No further intervention required.   Encounter Outcome:  Pt. Visit Completed   Cynthia Arms RN, BSN, [place] [facility]   Phone: [phone]

## 2022-09-15 ENCOUNTER — Other Ambulatory Visit: Payer: Self-pay | Admitting: Family Medicine

## 2022-09-17 ENCOUNTER — Other Ambulatory Visit: Payer: Self-pay | Admitting: Family Medicine

## 2022-09-25 ENCOUNTER — Encounter: Payer: Self-pay | Admitting: Family Medicine

## 2022-10-04 ENCOUNTER — Other Ambulatory Visit: Payer: Self-pay | Admitting: Family Medicine

## 2022-10-06 ENCOUNTER — Encounter: Payer: Self-pay | Admitting: Family Medicine

## 2022-10-06 ENCOUNTER — Telehealth: Payer: Self-pay | Admitting: Family Medicine

## 2022-10-06 NOTE — Telephone Encounter (Signed)
Prior Authorization request letter for [facility] placed in [place] Eve[place]'s box to process.

## 2022-10-07 ENCOUNTER — Encounter: Payer: Self-pay | Admitting: Internal Medicine

## 2022-10-07 ENCOUNTER — Ambulatory Visit (INDEPENDENT_AMBULATORY_CARE_PROVIDER_SITE_OTHER): Payer: BC Managed Care – PPO | Admitting: Internal Medicine

## 2022-10-07 VITALS — BP 128/86 | HR 71 | Ht 67.0 in | Wt 183.4 lb

## 2022-10-07 DIAGNOSIS — E0510 Thyrotoxicosis with toxic single thyroid nodule without thyrotoxic crisis or storm: Secondary | ICD-10-CM

## 2022-10-07 DIAGNOSIS — E042 Nontoxic multinodular goiter: Secondary | ICD-10-CM

## 2022-10-07 LAB — TSH: TSH: 0.18 u[IU]/mL — ABNORMAL LOW (ref 0.35–5.50)

## 2022-10-07 LAB — T3, FREE: T3, Free: 3.2 pg/mL (ref 2.3–4.2)

## 2022-10-07 LAB — T4, FREE: Free T4: 0.73 ng/dL (ref 0.60–1.60)

## 2022-10-07 NOTE — Patient Instructions (Addendum)
Please stop at the lab.  Please come back for a follow-up appointment in 1 year.  

## 2022-10-07 NOTE — Progress Notes (Signed)
Patient ID: Cynthia Wilson, female   DOB: [birth year], 57 y.o.   MRN: [id number]  HPI  Cynthia Wilson is a 57 y.o.-year-old female, referred by her PCP, Dr. Lum BabeEniola, in consultation for subclinical thyrotoxicosis and goiter.  Last visit 6 months ago.  Interim history: She denies tremors, palpitations, weight loss, anxiety. She does have hot flushes - chronic.Also, hair loss. Before last visit, she had constipation - tried Linzess >> did not work. She is on Mounjaro, but constipation started before starting Mounjaro. She had a colonoscopy - had diverticulosis.  Reviewed and addended history: She was diagnosed with a multinodular goiter in 2004.  I reviewed her TFTs: Lab Results  Component Value Date   TSH 0.28 (L) 04/08/2022   TSH 0.44 10/09/2021   TSH 0.14 (L) 04/08/2021   TSH 0.16 (L) 09/26/2020   TSH 0.192 (L) 07/31/2020   TSH 0.496 04/28/2018   TSH 0.22 (L) 07/25/2016   TSH 0.69 07/25/2014   TSH 0.268 (L) 07/07/2014   TSH 0.523 03/09/2012   FREET4 0.84 04/08/2022   FREET4 0.81 10/09/2021   FREET4 0.76 04/08/2021   FREET4 0.81 09/26/2020   FREET4 0.9 07/25/2016   FREET4 0.77 07/25/2014   FREET4 1.00 07/07/2014   Lab Results  Component Value Date   T3FREE 3.5 04/08/2022   T3FREE 3.3 10/09/2021   T3FREE 3.3 04/08/2021   T3FREE 2.9 09/26/2020   T3FREE 3.0 07/25/2016   T3FREE 3.2 07/25/2014   T3FREE 3.4 07/07/2014  TPO antibodies were normal.  Graves' antibodies were not elevated: Lab Results  Component Value Date   TSI <89 09/26/2020   Previous investigation: She had a Thyroid scan in 2004: 1.  NORMAL RIGHT LOBE OF THE THYROID. 2.  THREE COLD NODULES OF THE LEFT LOBE.    She had a thyroid U/S (04/08/2003): 1. DOMINANT 3.3 CM IN DIAMETER COMPLEX MASS CONTAINING CYSTIC AND SOLID COMPONENTS INVOLVING THE MAJORITY OF THE MID AND LOWER POLE OF THE LEFT LOBE OF THYROID CORRESPONDING TO THE COLD NODULE SEEN IN THIS REGION ON NUCLEAR MEDICINE STUDY.   2. THERE  IS A SECOND 1.6 CM SIZE COMPLEX NODULE ASSOCIATED IN THE LOWER POLE OF THE LEFT LOBE OF THYROID.  3. SMALLER (6 AND 8 MM IN SIZE) NODULES WITHIN THE MID RIGHT LOBE OF THE THYROID.  L Thyroid nodule FNA (05/10/2003): The morphologic features are most consistent with a colloid nodule.   [facility]):  Several R nodules:1.4 cm (heterogeneous) and 1.1 cm (hypoechoic), vascular, nodules and 3 subcm nodules 3 L nodules: 2.1 cm (hypervascular), the rest smaller: 1.5 and 0.7 cm hypoechoic nodules.  Thyroid U/S (08/07/2016): Parenchymal Echotexture: Mildly heterogenous Isthmus: Measures 0.3 cm in the AP dimension. Right lobe: 5.3 x 1.6 x 1.7 cm Left lobe: 5.5 x 1.6 x 2.1 cm _________________________________________________________    Nodule # 1: Location: Right; Mid, pregnancy was increased from a maximum URI Maximum size: 1.3 cm; Other 2 dimensions: 0.8 x 0.9 cm Composition: solid/almost completely solid (2)  Echogenicity: hypoechoic (2) ACR TI-RADS total points: 4. *Given size (>/= 1 - 1.4 cm) and appearance, a follow-up ultrasound in 1 year should be considered based on TI-RADS criteria.   Nodule # 2: Location: Right; Mid Maximum size: 1.5 cm; Other 2 dimensions: 0.8 x 1.0 cm Composition: solid/almost completely solid (2)  Echogenicity: hypoechoic (2) Echogenic foci: punctate echogenic foci (3) ACR TI-RADS total points: 7.  **Given size (>/= 1.0 cm) and appearance, fine needle aspiration of this  highly suspicious nodule should be considered based on TI-RADS criteria.   Multiple additional small nodules throughout the right thyroid lobe. There are 2 cysts in the right inferior thyroid, largest measuring up to 0.7 cm. ______________________________________________________________   Dominant mildly hypoechoic solid nodule in the mid left thyroid lobe that measures 2.3 x 1.3 x 1.6 cm. By report, this previously measured 3.3 x 1.5 x 2.1 cm. This  appears to represent the previously biopsied left thyroid nodule. Few small areas of cystic degeneration within this nodule. There are questionable echogenic foci throughout this nodule.   Nodule # 3: Location: Left; Inferior Maximum size: 1.5 cm; Other 2 dimensions: 0.7 x 1.1 cm. By report, there was a nodule in the inferior left thyroid lobe that measured 1.6 x 0.9 x 1.0 cm. Composition: solid/almost completely solid (2) Echogenicity: hypoechoic (2) Shape: not taller-than-wide (0) Margins: lobulated/irregular (2) Echogenic foci: macrocalcifications (1) ACR TI-RADS total points: 7.  **Given size (>/= 1.0 cm) and appearance, fine needle aspiration of this highly suspicious nodule should be considered based on TI-RADS criteria.   IMPRESSION: Multiple thyroid nodules.   The dominant nodule in left thyroid lobe measures up to 2.3 cm and appears to represent the previously biopsied nodule.   Nodule in the inferior left thyroid lobe (Nodule #3) and a nodule in the right mid thyroid lobe (Nodule #2) both meet criteria for biopsy.   Nodule #1 in the mid right thyroid lobe meets criteria for 1 year follow-up.    FNA of the dominant nodules (09/10/2016): Adequacy Reason UNSATISFACTORY for evaluation due to extremely scant cellularity. The specimen is processed and examined microscopically, but is found to be unsatisfactory for evaluation of an epithelial abnormality. Repeat study recommended. Diagnosis THYROID, FINE NEEDLE ASPIRATION LEFT LOWER POLE (SPECIMEN 1 OF 2 COLLECTED 09/10/2016) BLOOD. NO FOLLICULAR CELLULARITY PRESENT. Jimmy Picket MD Pathologist, Electronic Signature (Case signed 09/11/2016) Specimen Clinical Information Previous Biopsy today and 05/11/2003), Nodule 3: Left Inferior 1.5 cm; Other 2 dimensions: 0.7 x 1.1 cm., By report there was nodule in the inferior left thyroid lobe that measured 1.6 x 0.9 x 1.0 cm., Hypoechoic, ACR TI- RADS total points:7, Highly  suspicious nodule  Adequacy Reason Satisfactory For Evaluation. Diagnosis THYROID, FINE NEEDLE ASPIRATION, RMP (SPECIMEN 2 OF 2, COLLECTED 09/10/16): CONSISTENT WITH BENIGN FOLLICULAR NODULE (BETHESDA CATEGORY II). Jimmy Picket MD Pathologist, Electronic Signature (Case signed 09/11/2016) Specimen Clinical Information Today and11/05/2003, Nodule 2: Right Mid 1.5 cm; Other 2 dimensions: 0.8 x 1.0cm solid /almost completely solid , Hypoechoic, ACR TI - RADS total points: 7, Highly suspicious nodule Source Thyroid, Fine Needle Aspiration, Rt Lobe RMP, (Specimen 2of 2, collected on 09/10/16 )  Thyroid uptake and scan (10/25/2020): Low uptake in left hot nodule versus toxic multinodular goiter: Multiple bilateral nodules. Gland normal size. One warm nodule in the upper pole of the LEFT lobe. 4 hour I-123 uptake = 4.4% (normal 5-20%) 24 hour I-123 uptake = 13.0% (normal 10-30%)   IMPRESSION: 1. Multinodular gland. 2. Low I 131 uptake. 3. With depressed TSH, findings most suggestive of toxic multinodular goiter versus versus autonomous nodule in the upper pole LEFT thyroid gland.  She opted not to go ahead with RAI treatment.  Pt denies: - feeling nodules in neck - hoarseness - dysphagia - choking  No FH of thyroid disease or of thyroid cancer. No h/o radiation tx to head or neck. No herbal supplements. No Biotin use. No recent steroids use.   She also has a history of HTN, GERD,  DM2.  She has mild prediabetes.  Review latest HbA1c levels: Lab Results  Component Value Date   HGBA1C 5.8 12/20/2021   HGBA1C 5.7 08/16/2021   HGBA1C 5.8 03/26/2021   HGBA1C 5.8 (A) 07/31/2020   HGBA1C 6.2 02/14/2020   HGBA1C 5.9 04/26/2019   HGBA1C 6.4 11/17/2018   HGBA1C 6.1 04/06/2018   HGBA1C 6.0 12/01/2017   HGBA1C 6.2 09/21/2017  He is on Ozempic 2 mg weekly.  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  ROS: + see HPI  I reviewed pt's medications, allergies, PMH, social  hx, family hx:  Past Medical History:  Diagnosis Date   Allergy    Arthritis    GERD (gastroesophageal reflux disease)    Heart murmur    Heart murmur 02/01/2013   History of osteoarthritis    left knee   Hypertension    under control with med., has been on med. x 5 yr.   Immature cataract 2021   removed bilaterally   KNEE PAIN, LEFT, CHRONIC 07/30/2009   Seen by [facility] 03/31/12,DJD,  steroid injection    Medial meniscus tear 09/2013   right knee   Non-insulin dependent type 2 diabetes mellitus (HCC)    off metformin- diet controlled per pt    Pelvic peritoneal adhesions, female 04/21/2013   Recurrent tonsillitis 12/01/2016   Sinus headache 10/30/2015   Vertigo 02/07/2011   Wears partial dentures    upper   Past Surgical History:  Procedure Laterality Date   CATARACT EXTRACTION Right 01/2020   CATARACT EXTRACTION Left 11/2019   COLONOSCOPY  2009   normal    CORNEAL TRANSPLANT Right 2022   CYST EXCISION  03/25/2007   resection peritoneal inclusion cyst   HERNIA REPAIR     JOINT REPLACEMENT Left    knee   KNEE ARTHROSCOPY Left 2011   KNEE ARTHROSCOPY Right 10/21/2013   Procedure: ARTHROSCOPY RIGHT KNEE;  Surgeon: Harvie Junior, MD;  Location: [facility];  Service: Orthopedics;  Laterality: Right;  medial , lateral and patella femoral chondromalsia and medial plica   LAPAROSCOPY N/A 04/21/2013   Procedure: LAPAROSCOPY DIAGNOSTIC;  Surgeon: Willodean Rosenthal, MD;  Location: [facility];  Service: Gynecology;  Laterality: N/A;   LAPAROTOMY N/A 04/21/2013   Procedure: LAPAROTOMY;  Surgeon: Willodean Rosenthal, MD;  Location: [facility];  Service: Gynecology;  Laterality: N/A;   LYSIS OF ADHESION  08/28/2005; 03/25/2007; 04/21/2013   POLYPECTOMY     SALPINGOOPHORECTOMY Left 04/21/2013   Procedure: SALPINGO OOPHORECTOMY;  Surgeon: Willodean Rosenthal, MD;  Location: [facility];  Service: Gynecology;  Laterality: Left;   TOTAL ABDOMINAL HYSTERECTOMY   08/28/2005   TOTAL KNEE ARTHROPLASTY Left 02/11/2013   Procedure: LEFT TOTAL KNEE ARTHROPLASTY;  Surgeon: Harvie Junior, MD;  Location: [facility];  Service: Orthopedics;  Laterality: Left;  NO COMPUTER   UMBILICAL HERNIA REPAIR  1984   UNILATERAL SALPINGECTOMY Right 08/28/2005   History   Social History   Marital Status: Married    Spouse Name: N/A    Number of Children: 4   Occupational History    Nutritional therapist    Social History Main Topics   Smoking status: Never Smoker    Smokeless tobacco: Never Used   Alcohol Use: No   Drug Use: No   Sexual Activity: Yes    Birth Control/ Protection: Surgical   Social History Narrative   Lives with husband and 66 year old son.     Current Outpatient  Medications on File Prior to Visit  Medication Sig Dispense Refill   acetaminophen (TYLENOL) 500 MG tablet Take 500 mg by mouth every 6 (six) hours as needed. (Patient not taking: Reported on 08/22/2022)     cetirizine (ZYRTEC) 10 MG tablet Take 10 mg by mouth daily.     diclofenac sodium (VOLTAREN) 1 % GEL Apply 4 g to your back four times daily as needed for pain (Patient not taking: Reported on 08/22/2022) 100 g 0   famotidine (PEPCID) 20 MG tablet Take 1 tablet (20 mg total) by mouth 2 (two) times daily.     fluticasone (FLONASE) 50 MCG/ACT nasal spray SPRAY 2 SPRAYS INTO EACH NOSTRIL EVERY DAY 16 mL 6   ibuprofen (ADVIL) 400 MG tablet Take 1 tablet (400 mg total) by mouth every 8 (eight) hours as needed. 60 tablet 1   lisinopril (ZESTRIL) 5 MG tablet TAKE 1 TABLET (5 MG TOTAL) BY MOUTH DAILY. 90 tablet 1   loratadine (CLARITIN) 10 MG tablet Take 10 mg by mouth daily. (Patient not taking: Reported on 08/22/2022)     Multiple Vitamin (MULTIVITAMIN) tablet Take 1 tablet by mouth daily. Womens Ultra Mega Vitamin     rosuvastatin (CRESTOR) 10 MG tablet TAKE 1 TABLET ON MON, WEDNESDAY AND FRIDAYS ONLY (3 TIMES WEEKLY) 36 tablet 1   tirzepatide (MOUNJARO) 15 MG/0.5ML Pen INJECT 15 MG INTO THE  SKIN ONCE A WEEK. 2 mL 1   No current facility-administered medications on file prior to visit.   Allergies  Allergen Reactions   Iohexol Hives         Ivp Dye [Iodinated Contrast Media] Hives    Denies airway involvement   Other Rash    COBAN   Family History  Problem Relation Age of Onset   Diabetes Mother    Hypertension Mother    Hyperlipidemia Mother    Colon polyps Mother    Emphysema Father    Breast cancer Cousin 78   Colon polyps Sister    Esophageal cancer Brother    Colon cancer Neg Hx    Rectal cancer Neg Hx    Stomach cancer Neg Hx    PE: BP 128/86 (BP Location: Left Arm, Patient Position: Sitting, Cuff Size: Normal)   Pulse 71   Ht 5\' 7"  (1.702 m)   Wt 183 lb 6.4 oz (83.2 kg)   SpO2 99%   BMI 28.72 kg/m    Wt Readings from Last 3 Encounters:  10/07/22 183 lb 6.4 oz (83.2 kg)  06/27/22 178 lb 6 oz (80.9 kg)  03/21/22 194 lb 3.2 oz (88.1 kg)   Constitutional: overweight, in NAD Eyes: EOMI, no exophthalmos ENT: + very mild left thyroid fullness, no cervical lymphadenopathy Cardiovascular: RRR, No MRG Respiratory: CTA B Musculoskeletal: no deformities Skin: no rashes Neurological: no tremor with outstretched hands  ASSESSMENT: 1.  Subclinical thyrotoxicosis  2.  Multinodular goiter - 2004 FNA of the left dominant nodule: benign - 2018 FNA of them left lung dominant nodule: Benign - 2018 FNA of the right dominant nodule: Inconclusive due to unsatisfactory sample  PLAN:  1. 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 -At our visit in 08/2020, she returned after 3 years of absence.  TSI antibodies were undetectable.  Thyroid uptake and scan performed in 09/2020 showed low uptake with a warm left nodule versus toxic multinodular goiter.  I ordered the RAI treatment but she did not proceed with this.  She did  not want to proceed with thyroidectomy either.   -Subsequent TFTs were improved so  we discussed about methimazole treatment but I again recommended RAI ablation as definitive treatment.  She was reticent to go ahead with this due to her lack of symptoms from the mild thyrotoxicosis.  Therefore, since her thyrotoxicosis is mild, we decided to just continue to follow the test, unless they start worsening -At last visit, TFTs were normal and she had a slightly low TSH in 03/2022.  Free thyroid hormones were normal. -No signs or symptoms of thyrotoxicosis now.  He has flashes, and also hair loss, possibly both related to menopause. -Will recheck her TFTs and may need methimazole depending on the results -I will see her back in 1 year  2. Multinodular goiter -Patient with long history of thyroid nodule, status post thyroid scan in 2004 that showed 3 calls left thyroid nodules.  She had a neck and a of the dominant nodule, which, at that time, measured 3.3 cm and was benign.  The nodule subsequently decreased in size and on the ultrasound from 2019, in the largest dimension was 2.3 cm.  On this ultrasound, she had another, smaller, nodule, in the left lobe, which was hypoechoic and had microcalcifications.  Biopsy of this nodule in 2018 was benign.  In the right lobe, the 2019 ultrasound showed 2 nodules: 1 hypoechoic and risk with the recommendation to be just followed up without intervention, but 1 hypoechoic, with microcalcifications and the recommendation for biopsy.  We biopsied this nodule in 2018 but the biopsy was inconclusive due to scant material.  At that time, suggested repeat biopsy in 6 months but she was lost for follow-up.  She returned after 3 years and we decided to proceed with another thyroid ultrasound after the results of the thyroid uptake and scan were back.  Since the nodules were warm/hot on the scan, I did not suggest any biopsy at that time since overproducing thyroid nodules are usually benign. -At this visit, she has no neck compression symptoms and I do not feel  nodules on palpation of her neck.  She does have very mild left thyroid fullness. -We will repeat a thyroid ultrasound at next visit  Component     Latest Ref Rng 10/07/2022  TSH     0.35 - 5.50 uIU/mL 0.18 (L)   T4,Free(Direct)     0.60 - 1.60 ng/dL 1.61   Triiodothyronine,Free,Serum     2.3 - 4.2 pg/mL 3.2   TSH is lower, however, free thyroid hormones are still normal.  She has no symptoms of hyperthyroidism. Will have her return to the clinic for repeat labs in approximately 4 months.  Carlus Pavlov, MD PhD [facility] Endocrinology

## 2022-10-09 ENCOUNTER — Telehealth: Payer: Self-pay

## 2022-10-09 ENCOUNTER — Other Ambulatory Visit (HOSPITAL_COMMUNITY): Payer: Self-pay

## 2022-10-09 NOTE — Telephone Encounter (Signed)
A Prior Authorization was initiated for this patients MOUNJARO through CoverMyMeds.   Key: [id number]

## 2022-10-10 ENCOUNTER — Encounter: Payer: Self-pay | Admitting: Family Medicine

## 2022-10-10 ENCOUNTER — Telehealth (INDEPENDENT_AMBULATORY_CARE_PROVIDER_SITE_OTHER): Payer: BC Managed Care – PPO | Admitting: Family Medicine

## 2022-10-10 ENCOUNTER — Other Ambulatory Visit (HOSPITAL_COMMUNITY): Payer: Self-pay

## 2022-10-10 ENCOUNTER — Other Ambulatory Visit: Payer: Self-pay

## 2022-10-10 VITALS — Wt 180.0 lb

## 2022-10-10 DIAGNOSIS — K219 Gastro-esophageal reflux disease without esophagitis: Secondary | ICD-10-CM

## 2022-10-10 DIAGNOSIS — E119 Type 2 diabetes mellitus without complications: Secondary | ICD-10-CM | POA: Diagnosis not present

## 2022-10-10 MED ORDER — ESOMEPRAZOLE MAGNESIUM 20 MG PO CPDR
20.0000 mg | DELAYED_RELEASE_CAPSULE | Freq: Every day | ORAL | 1 refills | Status: AC
  Filled 2022-10-10: qty 30, 30d supply, fill #0

## 2022-10-10 MED ORDER — TIRZEPATIDE-WEIGHT MANAGEMENT 15 MG/0.5ML ~~LOC~~ SOAJ
15.0000 mg | SUBCUTANEOUS | 2 refills | Status: DC
  Filled 2022-10-10: qty 2, 28d supply, fill #0

## 2022-10-10 NOTE — Assessment & Plan Note (Signed)
D/C Peptide. Nexium escribed. F/U soon if no improvement. She agreed with the plan.

## 2022-10-10 NOTE — Assessment & Plan Note (Signed)
She is down to 180 lbs from 216 lbs I discussed transition from [place] to Zepbound since Greggory Keen is out of stock in most pharmacy. She prefers this option. Zepbound 15 mg weekly escribed. F/U in 4 weeks for reassessment.

## 2022-10-10 NOTE — Patient Instructions (Signed)
Tirzepatide Injection (Weight Management) What is this medication? TIRZEPATIDE (tir ZEP a tide) promotes weight loss. It may also be used to maintain weight loss. It works by decreasing appetite. Changes to diet and exercise are often combined with this medication. This medicine may be used for other purposes; ask your health care provider or pharmacist if you have questions. COMMON BRAND NAME(S): Zepbound What should I tell my care team before I take this medication? They need to know if you have any of these conditions: Eye disease caused by diabetes Gallbladder disease History of depression Pancreatic disease Kidney disease Stomach or intestine problems, such as problems digesting food Suicidal thoughts, plans, or attempt by you or a family member Personal or family history of MEN 2, a condition that causes endocrine gland tumors Personal or family history of thyroid cancer An unusual or allergic reaction to tirzepatide, other medications, foods, dyes, or preservatives Pregnant or trying to get pregnant Breastfeeding How should I use this medication? This medication is injected under the skin. You will be taught how to prepare and give it. Take it as directed on the prescription label. Keep taking it unless your care team tells you to stop. It is important that you put your used needles and syringes in a special sharps container. Do not put them in a trash can. If you do not have a sharps container, call your pharmacist or care team to get one. A special MedGuide will be given to you by the pharmacist with each prescription and refill. Be sure to read this information carefully each time. This medication comes with INSTRUCTIONS FOR USE. Ask your pharmacist for directions on how to use this medication. Read the information carefully. Talk to your pharmacist or care team if you have questions. Talk to your care team about the use of this medication in children. Special care may be  needed. Overdosage: If you think you have taken too much of this medicine contact a poison control center or emergency room at once. NOTE: This medicine is only for you. Do not share this medicine with others. What if I miss a dose? If you miss a dose, take it as soon as you can unless it is more than 4 days (96 hours) late. If it is more than 4 days late, skip the missed dose. Take the next dose at the normal time. Do not take 2 doses within 3 days (72 hours) of each other. What may interact with this medication? Certain medications for diabetes, such as insulin, glyburide, glipizide This medication may affect how other medications work. Talk with your care team about all of the medications you take. They may suggest changes to your treatment plan to lower the risk of side effects and to make sure your medications work as intended. This list may not describe all possible interactions. Give your health care provider a list of all the medicines, herbs, non-prescription drugs, or dietary supplements you use. Also tell them if you smoke, drink alcohol, or use illegal drugs. Some items may interact with your medicine. What should I watch for while using this medication? Visit your care team for regular checks on your progress. It may be some time before you see the benefit from this medication. Check with your care team if you have severe diarrhea, nausea, and vomiting, or if you sweat a lot. The loss of too much body fluid may make it dangerous for you to take this medication. Tell your care team if you are taking   medications to treat diabetes, such as insulin or sulfonylureas. This may increase your risk of low blood sugar. Know the symptoms of low blood sugar and how to treat it. Talk to your care team about your risk of cancer. You may be more at risk for certain types of cancer if you take this medication. Estrogen and progestin hormones may not work as well while you are taking this medication. If  you take these as pills by mouth, your care team may recommend another type of contraception for 4 weeks after you start this medication and for 4 weeks after each dose increase. Talk to your care team about contraceptive options. They can help you find the option that works for you. What side effects may I notice from receiving this medication? Side effects that you should report to your care team as soon as possible: Allergic reactions or angioedema--skin rash, itching or hives, swelling of the face, eyes, lips, tongue, arms, or legs, trouble swallowing or breathing Bowel blockage--stomach cramping, unable to have a bowel movement or pass gas, loss of appetite, vomiting Change in vision Dehydration--increased thirst, dry mouth, feeling faint or lightheaded, headache, dark yellow or brown urine Gallbladder problems--severe stomach pain, nausea, vomiting, fever Kidney injury--decrease in the amount of urine, swelling of the ankles, hands, or feet Pancreatitis--severe stomach pain that spreads to your back or gets worse after eating or when touched, fever, nausea, vomiting Thoughts of suicide or self-harm, worsening mood, feelings of depression Thyroid cancer--new mass or lump in the neck, pain or trouble swallowing, trouble breathing, hoarseness Side effects that usually do not require medical attention (report these to your care team if they continue or are bothersome): Constipation Diarrhea Nausea Pain, redness, or irritation at injection site Stomach pain Upset stomach Vomiting This list may not describe all possible side effects. Call your doctor for medical advice about side effects. You may report side effects to FDA at 1-800-FDA-1088. Where should I keep my medication? Keep out of the reach of children and pets. Store in a refrigerator or at room temperature up to 30 degrees C (86 degrees F). Keep it in the original container. Protect from light. Refrigeration (preferred): Store in the  refrigerator. Do not freeze. Get rid of any unused medication after the expiration date. Room temperature: This medication may be stored at room temperature for up to 21 days. If it is stored at room temperature, get rid of any unused medication after 21 days or after it expires, whichever is first. To get rid of medications that are no longer needed or have expired: Take the medication to a medication take-back program. Check with your pharmacy or law enforcement to find a location. If you cannot return the medication, ask your pharmacist or care team how to get rid of this medication safely. NOTE: This sheet is a summary. It may not cover all possible information. If you have questions about this medicine, talk to your doctor, pharmacist, or health care provider.  2023 Elsevier/Gold Standard (2022-05-12 00:00:00)  

## 2022-10-10 NOTE — Telephone Encounter (Signed)
Prior Auth for patients medication MOUNJARO denied by [facility] via CoverMyMeds.   Reason:   CoverMyMeds Key:

## 2022-10-10 NOTE — Telephone Encounter (Signed)
Hello Cynthia Wilson,  She was started on Zepbound today. Sent to [facility] outpatient pharmacy. Will she need prior auth for that. On it for DM and weight management. Thanks.

## 2022-10-10 NOTE — Progress Notes (Signed)
[facility] Telemedicine Visit  Patient consented to have virtual visit and was identified by name and date of birth. Method of visit: Video  Encounter participants: Patient: Cynthia Wilson - located at home Provider: Janit Pagan - located at [facility] office Others (if applicable): N/A  Chief Complaint: medication management  HPI:  DM2/Weight Management: She was on Mounjaro 15 mg weekly. However, since this is out of stock, she went back to her 12.5 mg dose with her last dose taken yesterday. She would like to continue this medication as it helps, especially with her weight loss.  GERD:  She was on Pepcid 20 mg BID with no improvement, so she went ahead and obtained Nexium 20 mg, which helped. She requested a refill of her Nexium.  ROS: per HPI  Pertinent PMHx: PMHx reviewed  Exam:  Wt 180 lb (81.6 kg)   BMI 28.19 kg/m   Vitals and nursing note reviewed.  Pulmonary:     Effort: Pulmonary effort is normal. No respiratory distress.  Assessment/Plan:  Diabetes mellitus (HCC) Her A1C is now in the prediabetic range. Plan to switch Mounjaro to Zepbound. We will discuss weaning of this medication in the next visit and recheck her A1C at the time.  Morbid obesity (HCC) She is down to 180 lbs from 216 lbs I discussed transition from [place] to Zepbound since Greggory Keen is out of stock in most pharmacy. She prefers this option. Zepbound 15 mg weekly escribed. F/U in 4 weeks for reassessment.   GERD D/C Peptide. Nexium escribed. F/U soon if no improvement. She agreed with the plan.    Time spent during visit with patient: 15 minutes

## 2022-10-10 NOTE — Assessment & Plan Note (Signed)
Her A1C is now in the prediabetic range. Plan to switch Mounjaro to Zepbound. We will discuss weaning of this medication in the next visit and recheck her A1C at the time.

## 2022-10-13 ENCOUNTER — Other Ambulatory Visit (HOSPITAL_COMMUNITY): Payer: Self-pay

## 2022-10-15 DIAGNOSIS — H40013 Open angle with borderline findings, low risk, bilateral: Secondary | ICD-10-CM | POA: Diagnosis not present

## 2022-10-16 ENCOUNTER — Telehealth: Payer: Self-pay

## 2022-10-16 ENCOUNTER — Other Ambulatory Visit (HOSPITAL_COMMUNITY): Payer: Self-pay

## 2022-10-16 NOTE — Telephone Encounter (Signed)
A Prior Authorization was initiated for this patients ZEPBOUND through CoverMyMeds.   Key: [id number]

## 2022-10-16 NOTE — Telephone Encounter (Signed)
Prior Auth for patients medication ZEPBOUND denied by [facility] via CoverMyMeds.   Reason: MEDICATIONS USED FOR WEIGHT LOSS ARE NOT COVERED UNDER YOUR PLANS PHARMACY COVERAGE  CoverMyMeds Key: [id number]

## 2022-10-23 ENCOUNTER — Encounter: Payer: Self-pay | Admitting: Family Medicine

## 2022-10-24 ENCOUNTER — Encounter: Payer: Self-pay | Admitting: Family Medicine

## 2022-10-30 ENCOUNTER — Other Ambulatory Visit: Payer: Self-pay | Admitting: Family Medicine

## 2022-10-31 ENCOUNTER — Other Ambulatory Visit: Payer: Self-pay | Admitting: Family Medicine

## 2022-10-31 MED ORDER — LISINOPRIL 5 MG PO TABS: 5.0000 mg | ORAL_TABLET | Freq: Every day | ORAL | 1 refills | Status: DC

## 2022-10-31 MED ORDER — MOUNJARO 15 MG/0.5ML ~~LOC~~ SOAJ: 15.0000 mg | SUBCUTANEOUS | 2 refills | Status: DC

## 2022-11-04 LAB — HM DIABETES EYE EXAM

## 2022-11-26 ENCOUNTER — Encounter: Payer: Self-pay | Admitting: Family Medicine

## 2022-11-27 NOTE — Telephone Encounter (Signed)
Called and spoke with pharmacist. When they attempt to run medication, they are receiving message that medication needs a prior authorization.   This was previously denied on 10/10/22. Spoke with representative at [facility], advised that provider could complete appeal if appropriate.   Pharmacy does not have coupon for medication.   Will forward to PCP for further advisement.   Veronda Prude, RN

## 2022-12-11 ENCOUNTER — Other Ambulatory Visit: Payer: Self-pay | Admitting: Family Medicine

## 2022-12-16 ENCOUNTER — Encounter: Payer: Self-pay | Admitting: Family Medicine

## 2023-01-12 ENCOUNTER — Encounter: Payer: Self-pay | Admitting: Family Medicine

## 2023-01-14 DIAGNOSIS — H04123 Dry eye syndrome of bilateral lacrimal glands: Secondary | ICD-10-CM | POA: Diagnosis not present

## 2023-01-14 DIAGNOSIS — Z947 Corneal transplant status: Secondary | ICD-10-CM | POA: Diagnosis not present

## 2023-01-14 DIAGNOSIS — H40013 Open angle with borderline findings, low risk, bilateral: Secondary | ICD-10-CM | POA: Diagnosis not present

## 2023-01-14 DIAGNOSIS — H35371 Puckering of macula, right eye: Secondary | ICD-10-CM | POA: Diagnosis not present

## 2023-01-14 LAB — HM DIABETES EYE EXAM

## 2023-01-16 ENCOUNTER — Encounter: Payer: Self-pay | Admitting: Family Medicine

## 2023-01-16 ENCOUNTER — Ambulatory Visit (INDEPENDENT_AMBULATORY_CARE_PROVIDER_SITE_OTHER): Payer: BC Managed Care – PPO | Admitting: Family Medicine

## 2023-01-16 ENCOUNTER — Other Ambulatory Visit: Payer: Self-pay

## 2023-01-16 VITALS — BP 150/81 | HR 65 | Ht 67.0 in | Wt 197.4 lb

## 2023-01-16 DIAGNOSIS — I10 Essential (primary) hypertension: Secondary | ICD-10-CM

## 2023-01-16 DIAGNOSIS — E119 Type 2 diabetes mellitus without complications: Secondary | ICD-10-CM

## 2023-01-16 LAB — POCT GLYCOSYLATED HEMOGLOBIN (HGB A1C): HbA1c, POC (controlled diabetic range): 5.6 % (ref 0.0–7.0)

## 2023-01-16 MED ORDER — PHENTERMINE-TOPIRAMATE ER 7.5-46 MG PO CP24: 1.0000 | ORAL_CAPSULE | Freq: Every day | ORAL | 1 refills | Status: DC

## 2023-01-16 MED ORDER — PHENTERMINE-TOPIRAMATE ER 3.75-23 MG PO CP24: 1.0000 | ORAL_CAPSULE | Freq: Every day | ORAL | 0 refills | Status: DC

## 2023-01-16 NOTE — Progress Notes (Signed)
    SUBJECTIVE:   CHIEF COMPLAINT / HPI:  HTN: She did not take her Lisinopril 5 mg today. She came to the clinic from her mother's house. No other concerns.  DM2/Weight Management: She is here for the follow-up. Off Mounjaro for three months. She is now gaining weight again and worries this might worsen her DM2.   PERTINENT  PMH / PSH: PMHx reviewed  OBJECTIVE:   BP (!) 150/81   Pulse 65   Ht 5\' 7"  (1.702 m)   Wt 197 lb 6.4 oz (89.5 kg)   SpO2 100%   BMI 30.92 kg/m   Physical Exam Vitals and nursing note reviewed.  Cardiovascular:     Rate and Rhythm: Normal rate and regular rhythm.     Heart sounds: Normal heart sounds. No murmur heard. Pulmonary:     Effort: Pulmonary effort is normal. No respiratory distress.     Breath sounds: Normal breath sounds. No wheezing.  Musculoskeletal:     Right lower leg: No edema.     Left lower leg: No edema.  Feet:     Comments: Sensory exam of the foot is normal, tested with the monofilament. Good pulses, no lesions or ulcers, good peripheral pulses.      ASSESSMENT/PLAN:   HYPERTENSION, BENIGN SYSTEMIC Elevated this morning due to missed dose She is advised to take her meds soon as she returns home She agreed with the plan  Diabetes mellitus (HCC) A1C today 5.6 which is in the normal range Continue diet control F/U in 3 months for recheck If normal A1C in 3 months, will take DM2 diagnosis off her PL FLP, Bmet and urine microalbumin checked today She agreed with the plan  Morbid obesity (HCC) She gained a few pounds since she came off her Mounjaro Unfortunately, her insurance stopped covering this medication I discussed Qsymia and she is interested in trying this medication for weight loss Start low dose 3.75 mg every day x 14 days and then transition to 7.5 mg every day She agreed with the plan and understands the instruction given     Janit Pagan, MD [facility]

## 2023-01-16 NOTE — Assessment & Plan Note (Signed)
She gained a few pounds since she came off her Mounjaro Unfortunately, her insurance stopped covering this medication I discussed Qsymia and she is interested in trying this medication for weight loss Start low dose 3.75 mg every day x 14 days and then transition to 7.5 mg every day She agreed with the plan and understands the instruction given

## 2023-01-16 NOTE — Assessment & Plan Note (Addendum)
A1C today 5.6 which is in the normal range Continue diet control F/U in 3 months for recheck If normal A1C in 3 months, will take DM2 diagnosis off her PL FLP, Bmet and urine microalbumin checked today She agreed with the plan

## 2023-01-16 NOTE — Patient Instructions (Signed)
Phentermine; Topiramate Extended-Release Capsules What is this medication? Phentermine; Topiramate (FEN ter meen; Toe PYRE a mate) promotes weight loss. It may also be used to maintain weight loss. It works by decreasing appetite. Changes to diet and exercise are often combined with this medication. This medicine may be used for other purposes; ask your health care provider or pharmacist if you have questions. COMMON BRAND NAME(S): Qsymia What should I tell my care team before I take this medication? They need to know if you have any of these conditions: Bone problems Depression or other mental health condition Diabetes Diarrhea Glaucoma Having surgery Heart disease High blood pressure History of heart attack or stroke History of irregular heartbeat History of substance use disorder or alcohol use disorder Kidney disease or stones Liver disease Low levels of potassium in the blood Lung or breathing disease, like asthma Metabolic acidosis On a ketogenic diet Seizures Suicidal thoughts, plans, or attempt by you or a family member Taken an MAOI like Carbex, Eldepryl, Marplan, Nardil, or Parnate in last 14 days Thyroid disease An unusual or allergic reaction to phentermine, topiramate, other medications, foods, dyes, or preservatives Pregnant or trying to get pregnant Breast-feeding How should I use this medication? Take this medication by mouth with a glass of water. Follow the directions on the prescription label. Do not crush or chew. This medication is usually taken with or without food once per day in the morning. Avoid taking this medication in the evening. It may interfere with sleep. Take your doses at regular intervals. Do not take your medication more often than directed. A special MedGuide will be given to you by the pharmacist with each prescription and refill. Be sure to read this information carefully each time. Talk to your care team about the use of this medication in  children. While it may be prescribed for children as young as 12 years for selected conditions, precautions do apply. Overdosage: If you think you have taken too much of this medicine contact a poison control center or emergency room at once. NOTE: This medicine is only for you. Do not share this medicine with others. What if I miss a dose? If you miss a dose, skip it. Take your next dose at the normal time. Do not take extra or 2 doses at the same time to make up for the missed dose. What may interact with this medication? Do not take this medication with any of the following: MAOIs like Carbex, Eldepryl, Marplan, Nardil, and Parnate This medication may also interact with the following: Acetazolamide Alcohol Antihistamines for allergy, cough and cold Atropine Birth control pills Carbamazepine Certain medications for bladder problems like oxybutynin, tolterodine Certain medications for depression, anxiety, or psychotic disturbances Certain medications for high blood pressure Certain medications for Parkinson disease like benztropine, trihexyphenidyl Certain medications for sleep Certain medications for stomach problems like dicyclomine, hyoscyamine Certain medications for travel sickness like scopolamine Dichlorphenamide Digoxin Diuretics Linezolid Medications for colds or breathing difficulties like pseudoephedrine or phenylephrine Medications for diabetes Methazolamide Narcotic medications for pain Phenytoin Sibutramine Stimulant medications for attention disorders, weight loss, or to stay awake Valproic acid Zonisamide This list may not describe all possible interactions. Give your health care provider a list of all the medicines, herbs, non-prescription drugs, or dietary supplements you use. Also tell them if you smoke, drink alcohol, or use illegal drugs. Some items may interact with your medicine. What should I watch for while using this medication? Visit your care team for  regular checks on  your progress. Do not stop taking this medication except on your care team's advice. You may develop a severe reaction. Your care team will tell you how much medication to take. Do not take this medication close to bedtime. It may prevent you from sleeping. Avoid extreme heat. This medication can cause you to sweat less than normal. Your body temperature could increase to dangerous levels, which may lead to heat stroke. You should drink plenty of fluids while taking this medication. If you have had kidney stones in the past, this will help to reduce your chances of forming kidney stones. You may get drowsy or dizzy. Do not drive, use machinery, or do anything that needs mental alertness until you know how this medication affects you. Do not stand or sit up quickly, especially if you are an older patient. This reduces the risk of dizzy or fainting spells. Alcohol may increase dizziness and drowsiness. Avoid alcoholic drinks. This medication may affect blood sugar levels. Ask your care team if changes in diet or medications are needed if you have diabetes. Check with your care team if you have severe diarrhea, nausea, and vomiting, or if you sweat a lot. The loss of too much body fluid may make it dangerous for you to take this medication. Tell your care team right away if you have any change in your eyesight. Watch for new or worsening thoughts of suicide or depression. This includes sudden changes in mood, behaviors, or thoughts. These changes can happen at any time but are more common in the beginning of treatment or after a change in dose. Call your care team right away if you experience these thoughts or worsening depression. This medication may cause serious skin reactions. They can happen weeks to months after starting the medication. Contact your care team right away if you notice fevers or flu-like symptoms with a rash. The rash may be red or purple and then turn into blisters or  peeling of the skin. Or, you might notice a red rash with swelling of the face, lips or lymph nodes in your neck or under your arms. Birth control may not work properly while you are taking this medication. Talk to your care team about using an extra method of birth control. Tell your care team if you wish to become pregnant or think you might be pregnant. There is a potential for serious side effects and harm to an unborn child. Losing weight while pregnant is not advised and may cause harm to the unborn child. Talk to your care team for more information. What side effects may I notice from receiving this medication? Side effects that you should report to your care team as soon as possible: Allergic reactions--skin rash, itching, hives, swelling of the face, lips, tongue, or throat Difficulty with paying attention, memory, or speech Fast or irregular heartbeat Fever that does not go away, decreased sweating High acid level--trouble breathing, unusual weakness or fatigue, confusion, headache, fast or irregular heartbeat, nausea, vomiting High ammonia level--unusual weakness or fatigue, confusion, loss of appetite, nausea, vomiting, seizures Kidney injury--decrease in the amount of urine, swelling of the ankles, hands, or feet Kidney stones--blood in the urine, pain or trouble passing urine, pain in the lower back or sides Low potassium level--muscle pain or cramps, unusual weakness or fatigue, fast or irregular heartbeat, constipation Mood and behavior changes--anxiety, nervousness, confusion, hallucinations, irritability, hostility, thoughts of suicide or self-harm, worsening mood, feelings of depression Redness, blistering, peeling, or loosening of the skin, including inside  the mouth Sudden eye pain or change in vision such as blurry vision, seeing halos around lights, vision loss Side effects that usually do not require medical attention (report to your care team if they continue or are  bothersome): Burning or tingling sensation in hands or feet Change in taste Constipation Dizziness Dry mouth Trouble sleeping This list may not describe all possible side effects. Call your doctor for medical advice about side effects. You may report side effects to FDA at 1-800-FDA-1088. Where should I keep my medication? Keep out of the reach of children and pets. This medication can be abused. Keep it in a safe place to protect it from theft. Do not share it with anyone. It is only for you. Selling or giving away this medication is dangerous and against the law. Store at room temperature between 15 and 25 degrees C (59 and 77 degrees F). Protect from moisture. Keep the container tightly closed. This medication may cause harm and death if it is taken by other adults, children, or pets. It is important to get rid of the medication as soon as you no longer need it, or if it is expired. You can do this in two ways: Take the medication to a medication take-back program. Check with your pharmacy or law enforcement to find a location. If you cannot return the medication, check the label or package insert to see if the medication should be thrown out in the garbage or flushed down the toilet. If you are not sure, ask your care team. If it is safe to put in the trash, take the medication out of the container. Mix the medication with cat litter, dirt, coffee grounds, or other unwanted substance. Seal the mixture in a bag or container. Put it in the trash. NOTE: This sheet is a summary. It may not cover all possible information. If you have questions about this medicine, talk to your doctor, pharmacist, or health care provider.  2024 Elsevier/Gold Standard (2022-01-02 00:00:00)

## 2023-01-16 NOTE — Assessment & Plan Note (Signed)
Elevated this morning due to missed dose She is advised to take her meds soon as she returns home She agreed with the plan

## 2023-01-17 LAB — BASIC METABOLIC PANEL
BUN/Creatinine Ratio: 15 (ref 9–23)
BUN: 13 mg/dL (ref 6–24)
CO2: 26 mmol/L (ref 20–29)
Calcium: 9.2 mg/dL (ref 8.7–10.2)
Chloride: 103 mmol/L (ref 96–106)
Creatinine, Ser: 0.89 mg/dL (ref 0.57–1.00)
Glucose: 89 mg/dL (ref 70–99)
Potassium: 4.7 mmol/L (ref 3.5–5.2)
Sodium: 143 mmol/L (ref 134–144)
eGFR: 75 mL/min/{1.73_m2} (ref >59)

## 2023-01-17 LAB — MICROALBUMIN / CREATININE URINE RATIO
Creatinine, Urine: 99.9 mg/dL
Microalb/Creat Ratio: 15 mg/g creat (ref 0–29)
Microalbumin, Urine: 14.8 ug/mL

## 2023-01-17 LAB — LIPID PANEL
Chol/HDL Ratio: 2.1 ratio (ref 0.0–4.4)
Cholesterol, Total: 145 mg/dL (ref 100–199)
HDL: 69 mg/dL (ref >39)
LDL Chol Calc (NIH): 67 mg/dL (ref 0–99)
Triglycerides: 39 mg/dL (ref 0–149)
VLDL Cholesterol Cal: 9 mg/dL (ref 5–40)

## 2023-01-18 ENCOUNTER — Encounter: Payer: Self-pay | Admitting: Family Medicine

## 2023-01-19 ENCOUNTER — Encounter: Payer: Self-pay | Admitting: Family Medicine

## 2023-01-22 ENCOUNTER — Other Ambulatory Visit (HOSPITAL_COMMUNITY): Payer: Self-pay

## 2023-01-29 ENCOUNTER — Encounter: Payer: Self-pay | Admitting: Family Medicine

## 2023-01-30 ENCOUNTER — Other Ambulatory Visit: Payer: Self-pay | Admitting: Family Medicine

## 2023-01-30 MED ORDER — PHENTERMINE HCL 15 MG PO CAPS: 15.0000 mg | ORAL_CAPSULE | ORAL | 1 refills | Status: DC

## 2023-02-19 IMAGING — US US BREAST*L* LIMITED INC AXILLA
1 series · 2 of 2 positions shown · non-contrast
Comparison: Previous exam(s).

CLINICAL DATA: 55-year-old female with intermittent, focal pain of
the left breast for several weeks.

EXAM:
DIGITAL DIAGNOSTIC UNILATERAL LEFT MAMMOGRAM WITH TOMOSYNTHESIS AND
CAD; ULTRASOUND LEFT BREAST LIMITED
TECHNIQUE: Left digital diagnostic mammography and breast tomosynthesis was
performed. The images were evaluated with computer-aided detection.;
Targeted ultrasound examination of the left breast was performed

[Series 1: us breast*left* limited inc axilla · 0.05mm/px · 2 of 2 slices shown]
[im 1/2]
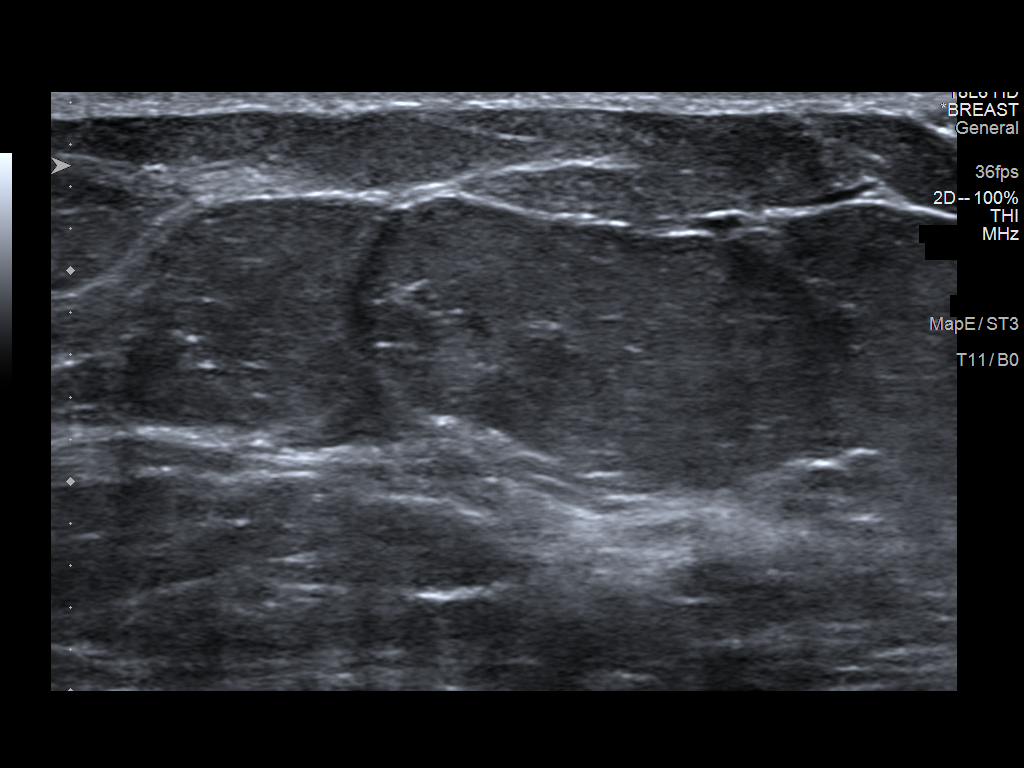
[im 2/2]
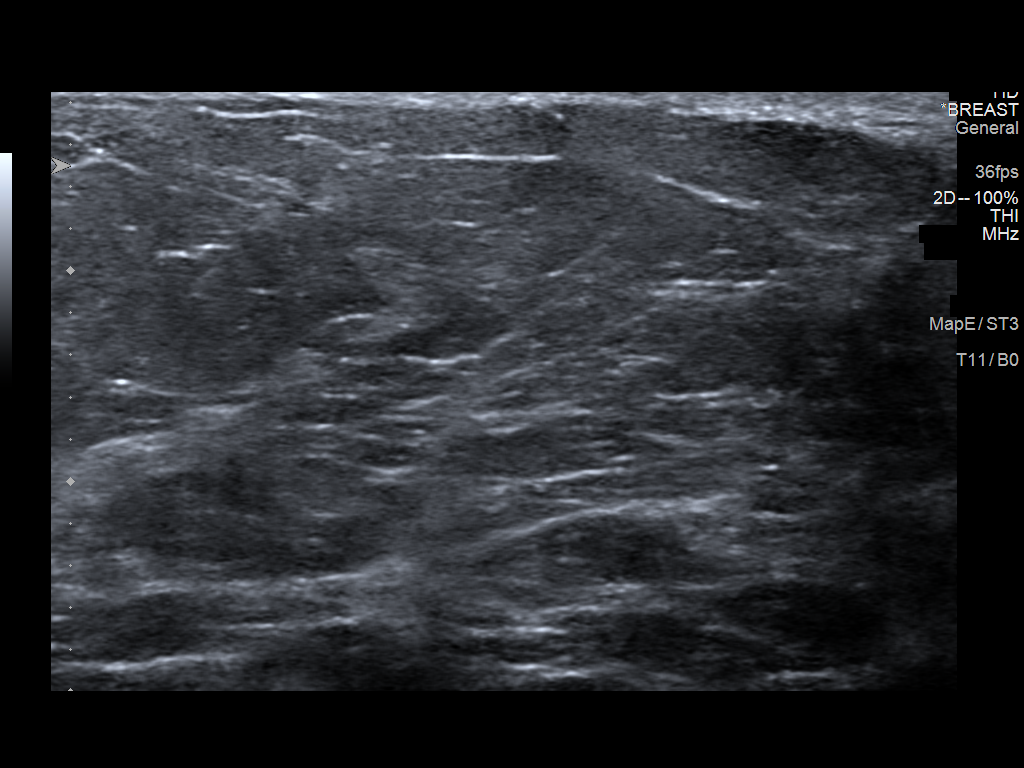

[2 of 2 positions shown; findings below may reference images not displayed]

ACR Breast Density Category b: There are scattered areas of
fibroglandular density.
FINDINGS: A radiopaque BB was placed at the site of the patient's focal pain
in the periareolar right breast. No suspicious findings are seen
deep to the radiopaque BB or within the remainder of the left
breast.

Targeted ultrasound is performed, showing normal fibroglandular
tissue without focal or suspicious sonographic abnormality.
IMPRESSION: No suspicious mammographic or sonographic findings in the left
breast.

RECOMMENDATION:
1. Clinical follow-up recommended for the focal area of concern in
the left breast. Any further workup should be based on clinical
grounds.
2. Routine bilateral screening due in January 2021.

I have discussed the findings and recommendations with the patient.
If applicable, a reminder letter will be sent to the patient
regarding the next appointment.

BI-RADS CATEGORY  1: Negative.

## 2023-02-19 IMAGING — MG MM DIGITAL DIAGNOSTIC UNILAT*L* W/ TOMO W/ CAD
6 series · 6 of 18 positions shown · non-contrast
Comparison: Previous exam(s).

CLINICAL DATA: 55-year-old female with intermittent, focal pain of
the left breast for several weeks.

EXAM:
DIGITAL DIAGNOSTIC UNILATERAL LEFT MAMMOGRAM WITH TOMOSYNTHESIS AND
CAD; ULTRASOUND LEFT BREAST LIMITED
TECHNIQUE: Left digital diagnostic mammography and breast tomosynthesis was
performed. The images were evaluated with computer-aided detection.;
Targeted ultrasound examination of the left breast was performed

[L CC synth-2D]
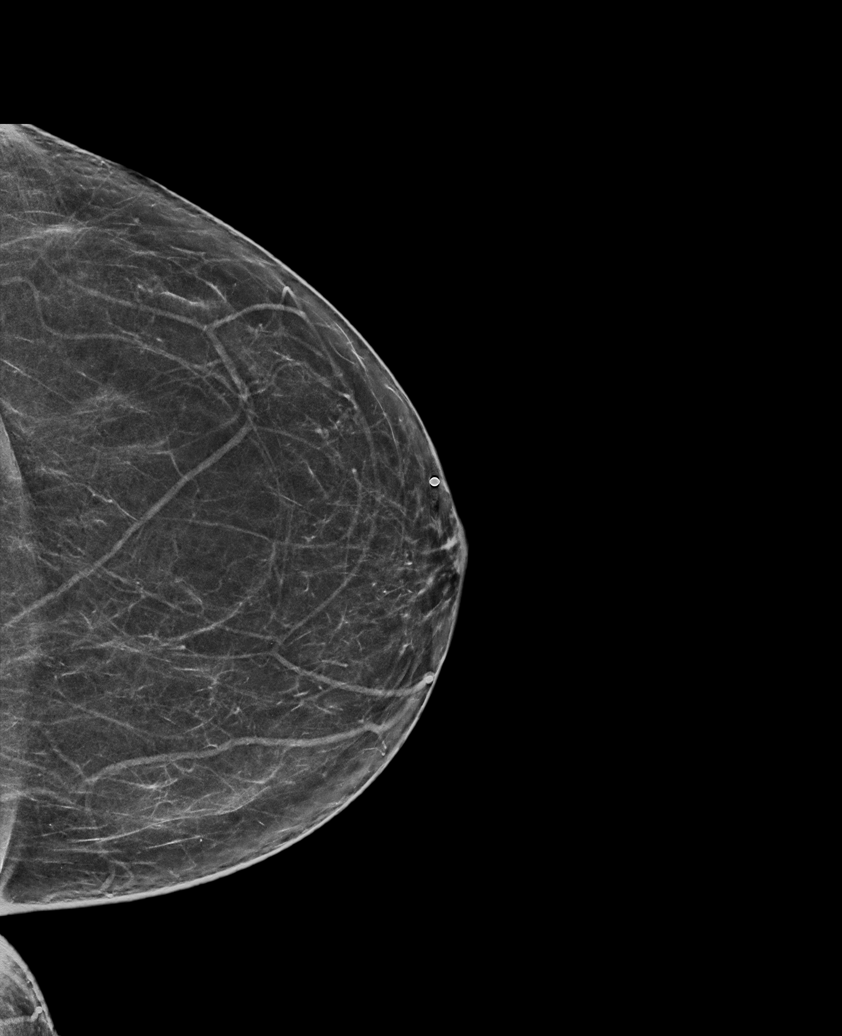

[L TAN synth-2D]
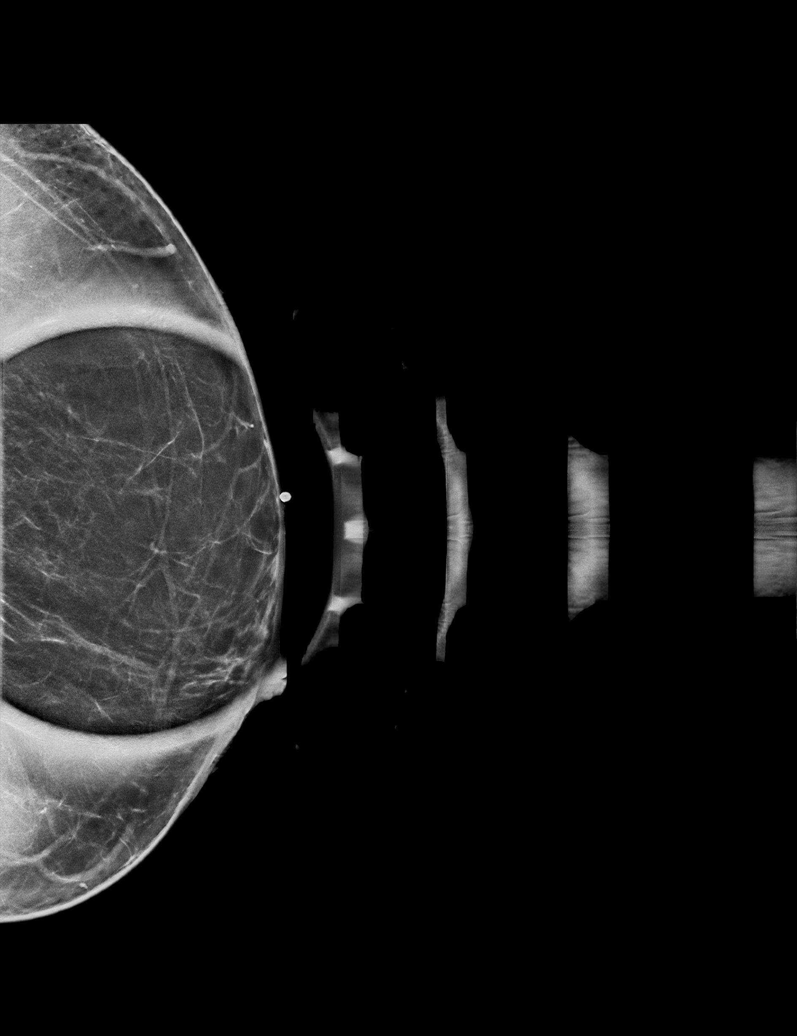

[L MLO synth-2D]
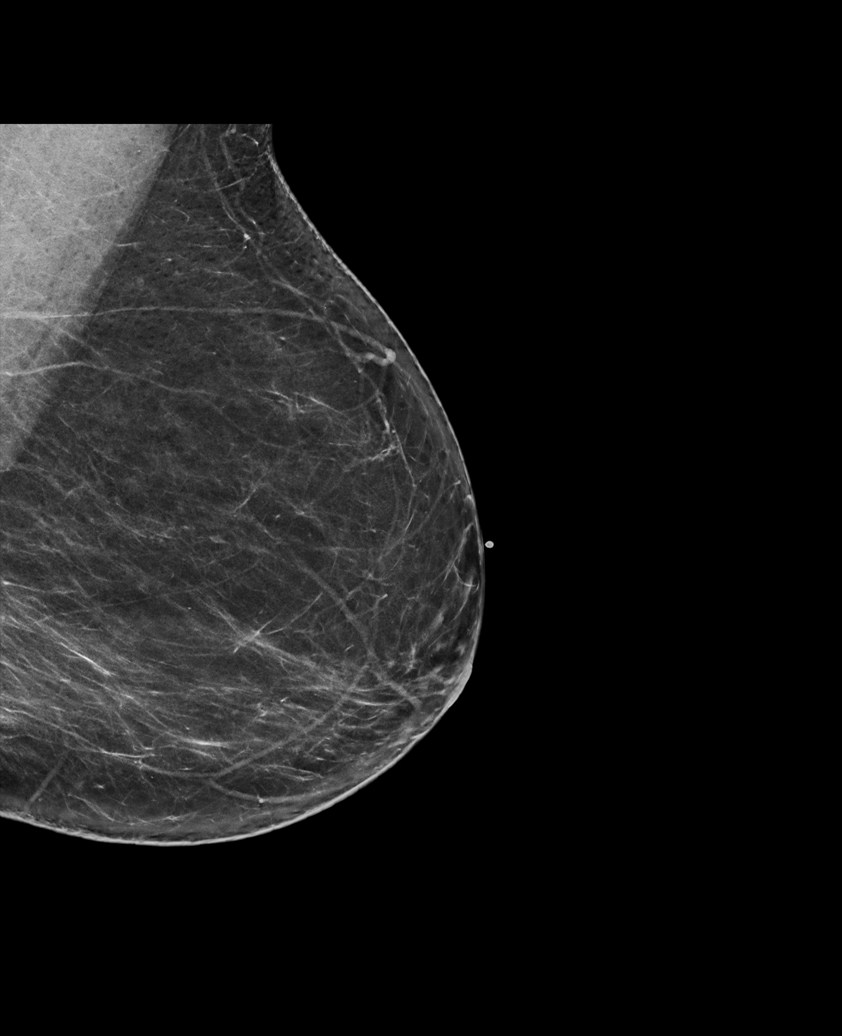

[L CC tomo · tomo slice 33/66.0]
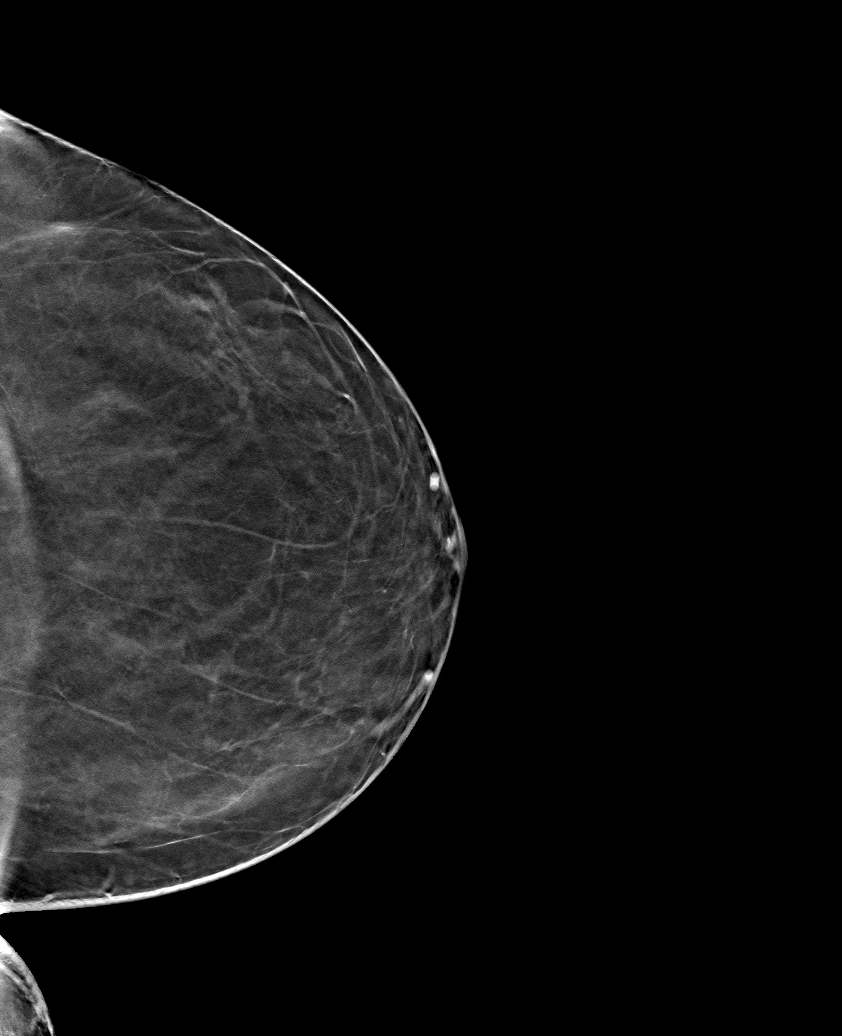

[L TAN tomo · tomo slice 32/63.0]
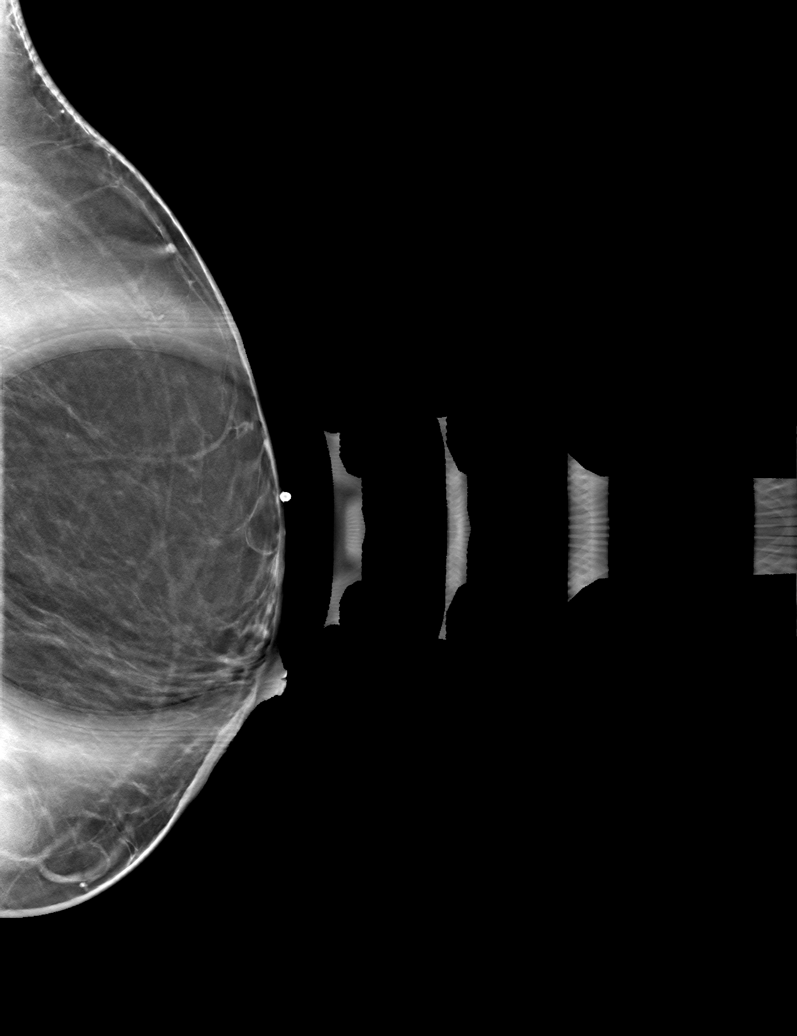

[L MLO tomo · tomo slice 37/73.0]
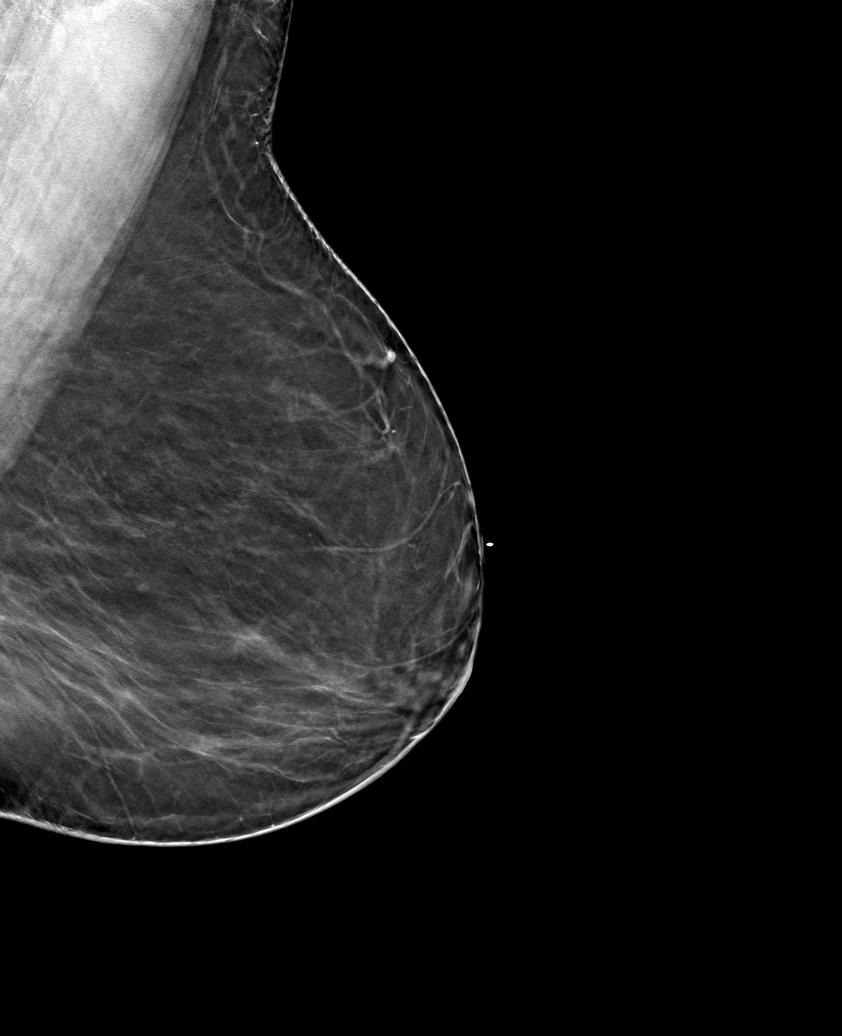

[6 of 18 positions shown; findings below may reference images not displayed]

ACR Breast Density Category b: There are scattered areas of
fibroglandular density.
FINDINGS: A radiopaque BB was placed at the site of the patient's focal pain
in the periareolar right breast. No suspicious findings are seen
deep to the radiopaque BB or within the remainder of the left
breast.

Targeted ultrasound is performed, showing normal fibroglandular
tissue without focal or suspicious sonographic abnormality.
IMPRESSION: No suspicious mammographic or sonographic findings in the left
breast.

RECOMMENDATION:
1. Clinical follow-up recommended for the focal area of concern in
the left breast. Any further workup should be based on clinical
grounds.
2. Routine bilateral screening due in January 2021.

I have discussed the findings and recommendations with the patient.
If applicable, a reminder letter will be sent to the patient
regarding the next appointment.

BI-RADS CATEGORY  1: Negative.

## 2023-02-23 ENCOUNTER — Other Ambulatory Visit: Payer: Self-pay | Admitting: Family Medicine

## 2023-02-23 ENCOUNTER — Encounter: Payer: Self-pay | Admitting: Family Medicine

## 2023-02-23 MED ORDER — PHENTERMINE HCL 37.5 MG PO TABS: 37.5000 mg | ORAL_TABLET | Freq: Every day | ORAL | 1 refills | Status: DC

## 2023-02-23 NOTE — Telephone Encounter (Signed)
I called to confirm medication cancellation and spoke with Zollie Scale at the pharmacy. She stated that she canceled the Phentermine 37.5 mg instead of the 15 mg although message specifically stated 15 mg.  I will resend the 15 mg dose.

## 2023-02-23 NOTE — Addendum Note (Signed)
Addended by: Janit Pagan T on: 02/23/2023 12:34 PM   Modules accepted: Orders

## 2023-02-23 NOTE — Telephone Encounter (Signed)
I called pharmacy multiple times to cancel refill of her 15 mg Phentermine with no response. I left a cancellation message for the pharmacy. I'll follow up on this.

## 2023-02-24 ENCOUNTER — Other Ambulatory Visit: Payer: Self-pay | Admitting: Family Medicine

## 2023-02-24 MED ORDER — PHENTERMINE HCL 37.5 MG PO TABS: 37.5000 mg | ORAL_TABLET | Freq: Every day | ORAL | 1 refills | Status: DC

## 2023-03-09 ENCOUNTER — Encounter: Payer: Self-pay | Admitting: Family Medicine

## 2023-03-10 ENCOUNTER — Other Ambulatory Visit: Payer: Self-pay | Admitting: Family Medicine

## 2023-03-10 DIAGNOSIS — Z1231 Encounter for screening mammogram for malignant neoplasm of breast: Secondary | ICD-10-CM

## 2023-03-10 NOTE — Telephone Encounter (Signed)
Everyone is booked  

## 2023-03-10 NOTE — Telephone Encounter (Signed)
Called to schedule patient appointment. I tried to get it earlier but everyone schedule is booked up. Her appt is sept 20 @11 :10

## 2023-03-13 ENCOUNTER — Encounter: Payer: Self-pay | Admitting: Pharmacist

## 2023-03-20 ENCOUNTER — Encounter: Payer: Self-pay | Admitting: Family Medicine

## 2023-03-20 ENCOUNTER — Ambulatory Visit (INDEPENDENT_AMBULATORY_CARE_PROVIDER_SITE_OTHER): Payer: BC Managed Care – PPO | Admitting: Family Medicine

## 2023-03-20 VITALS — BP 157/88 | HR 77 | Ht 67.0 in | Wt 212.0 lb

## 2023-03-20 DIAGNOSIS — I10 Essential (primary) hypertension: Secondary | ICD-10-CM

## 2023-03-20 DIAGNOSIS — Z6833 Body mass index (BMI) 33.0-33.9, adult: Secondary | ICD-10-CM | POA: Diagnosis not present

## 2023-03-20 DIAGNOSIS — J329 Chronic sinusitis, unspecified: Secondary | ICD-10-CM | POA: Diagnosis not present

## 2023-03-20 DIAGNOSIS — Z23 Encounter for immunization: Secondary | ICD-10-CM | POA: Diagnosis not present

## 2023-03-20 DIAGNOSIS — Z6837 Body mass index (BMI) 37.0-37.9, adult: Secondary | ICD-10-CM | POA: Insufficient documentation

## 2023-03-20 DIAGNOSIS — Z6839 Body mass index (BMI) 39.0-39.9, adult: Secondary | ICD-10-CM | POA: Insufficient documentation

## 2023-03-20 MED ORDER — AZELASTINE-FLUTICASONE 137-50 MCG/ACT NA SUSP: 1.0000 | Freq: Two times a day (BID) | NASAL | 1 refills | Status: DC

## 2023-03-20 NOTE — Patient Instructions (Addendum)
Let us start to wean your Phentermine. Take half of your phentermine daily x 1 week Then 1/2 tablet every other day x 1 week and then discontinue your medication  DASH Eating Plan DASH stands for Dietary Approaches to Stop Hypertension. The DASH eating plan is a healthy eating plan that has been shown to: Lower high blood pressure (hypertension). Reduce your risk for type 2 diabetes, heart disease, and stroke. Help with weight loss. What are tips for following this plan? Reading food labels Check food labels for the amount of salt (sodium) per serving. Choose foods with less than 5 percent of the Daily Value (DV) of sodium. In general, foods with less than 300 milligrams (mg) of sodium per serving fit into this eating plan. To find whole grains, look for the word "whole" as the first word in the ingredient list. Shopping Buy products labeled as "low-sodium" or "no salt added." Buy fresh foods. Avoid canned foods and pre-made or frozen meals. Cooking Try not to add salt when you cook. Use salt-free seasonings or herbs instead of table salt or sea salt. Check with your health care provider or pharmacist before using salt substitutes. Do not fry foods. Cook foods in healthy ways, such as baking, boiling, grilling, roasting, or broiling. Cook using oils that are good for your heart. These include olive, canola, avocado, soybean, and sunflower oil. Meal planning  Eat a balanced diet. This should include: 4 or more servings of fruits and 4 or more servings of vegetables each day. Try to fill half of your plate with fruits and vegetables. 6-8 servings of whole grains each day. 6 or less servings of lean meat, poultry, or fish each day. 1 oz is 1 serving. A 3 oz (85 g) serving of meat is about the same size as the palm of your hand. One egg is 1 oz (28 g). 2-3 servings of low-fat dairy each day. One serving is 1 cup (237 mL). 1 serving of nuts, seeds, or beans 5 times each week. 2-3 servings of  heart-healthy fats. Healthy fats called omega-3 fatty acids are found in foods such as walnuts, flaxseeds, fortified milks, and eggs. These fats are also found in cold-water fish, such as sardines, salmon, and mackerel. Limit how much you eat of: Canned or prepackaged foods. Food that is high in trans fat, such as fried foods. Food that is high in saturated fat, such as fatty meat. Desserts and other sweets, sugary drinks, and other foods with added sugar. Full-fat dairy products. Do not salt foods before eating. Do not eat more than 4 egg yolks a week. Try to eat at least 2 vegetarian meals a week. Eat more home-cooked food and less restaurant, buffet, and fast food. Lifestyle When eating at a restaurant, ask if your food can be made with less salt or no salt. If you drink alcohol: Limit how much you have to: 0-1 drink a day if you are female. 0-2 drinks a day if you are female. Know how much alcohol is in your drink. In the [place] one drink is one 12 oz bottle of beer (355 mL), one 5 oz glass of wine (148 mL), or one 1 oz glass of hard liquor (44 mL). General information Avoid eating more than 2,300 mg of salt a day. If you have hypertension, you may need to reduce your sodium intake to 1,500 mg a day. Work with your provider to stay at a healthy body weight or lose weight. Ask what the best  weight range is for you. On most days of the week, get at least 30 minutes of exercise that causes your heart to beat faster. This may include walking, swimming, or biking. Work with your provider or dietitian to adjust your eating plan to meet your specific calorie needs. What foods should I eat? Fruits All fresh, dried, or frozen fruit. Canned fruits that are in their natural juice and do not have sugar added to them. Vegetables Fresh or frozen vegetables that are raw, steamed, roasted, or grilled. Low-sodium or reduced-sodium tomato and vegetable juice. Low-sodium or reduced-sodium tomato sauce and  tomato paste. Low-sodium or reduced-sodium canned vegetables. Grains Whole-grain or whole-wheat bread. Whole-grain or whole-wheat pasta. Brown rice. Orpah Cobb. Bulgur. Whole-grain and low-sodium cereals. Pita bread. Low-fat, low-sodium crackers. Whole-wheat flour tortillas. Meats and other proteins Skinless chicken or [place]. Ground chicken or [place]. Pork with fat trimmed off. Fish and seafood. Egg whites. Dried beans, peas, or lentils. Unsalted nuts, nut butters, and seeds. Unsalted canned beans. Lean cuts of beef with fat trimmed off. Low-sodium, lean precooked or cured meat, such as sausages or meat loaves. Dairy Low-fat (1%) or fat-free (skim) milk. Reduced-fat, low-fat, or fat-free cheeses. Nonfat, low-sodium ricotta or cottage cheese. Low-fat or nonfat yogurt. Low-fat, low-sodium cheese. Fats and oils Soft margarine without trans fats. Vegetable oil. Reduced-fat, low-fat, or light mayonnaise and salad dressings (reduced-sodium). Canola, safflower, olive, avocado, soybean, and sunflower oils. Avocado. Seasonings and condiments Herbs. Spices. Seasoning mixes without salt. Other foods Unsalted popcorn and pretzels. Fat-free sweets. The items listed above may not be all the foods and drinks you can have. Talk to a dietitian to learn more. What foods should I avoid? Fruits Canned fruit in a light or heavy syrup. Fried fruit. Fruit in cream or butter sauce. Vegetables Creamed or fried vegetables. Vegetables in a cheese sauce. Regular canned vegetables that are not marked as low-sodium or reduced-sodium. Regular canned tomato sauce and paste that are not marked as low-sodium or reduced-sodium. Regular tomato and vegetable juices that are not marked as low-sodium or reduced-sodium. Rosita Fire. Olives. Grains Baked goods made with fat, such as croissants, muffins, or some breads. Dry pasta or rice meal packs. Meats and other proteins Fatty cuts of meat. Ribs. Fried meat. Tomasa Blase. Bologna,  salami, and other precooked or cured meats, such as sausages or meat loaves, that are not lean and low in sodium. Fat from the back of a pig (fatback). Bratwurst. Salted nuts and seeds. Canned beans with added salt. Canned or smoked fish. Whole eggs or egg yolks. Chicken or [place] with skin. Dairy Whole or 2% milk, cream, and half-and-half. Whole or full-fat cream cheese. Whole-fat or sweetened yogurt. Full-fat cheese. Nondairy creamers. Whipped toppings. Processed cheese and cheese spreads. Fats and oils Butter. Stick margarine. Lard. Shortening. Ghee. Bacon fat. Tropical oils, such as coconut, palm kernel, or palm oil. Seasonings and condiments Onion salt, garlic salt, seasoned salt, table salt, and sea salt. Worcestershire sauce. Tartar sauce. Barbecue sauce. Teriyaki sauce. Soy sauce, including reduced-sodium soy sauce. Steak sauce. Canned and packaged gravies. Fish sauce. Oyster sauce. Cocktail sauce. Store-bought horseradish. Ketchup. Mustard. Meat flavorings and tenderizers. Bouillon cubes. Hot sauces. Pre-made or packaged marinades. Pre-made or packaged taco seasonings. Relishes. Regular salad dressings. Other foods Salted popcorn and pretzels. The items listed above may not be all the foods and drinks you should avoid. Talk to a dietitian to learn more. Where to find more information National Heart, Lung, and Blood Institute (NHLBI): BuffaloDryCleaner.gl American Heart Association (  AHA): heart.org Academy of Nutrition and Dietetics: eatright.org National Kidney Foundation (NKF): kidney.org This information is not intended to replace advice given to you by your health care provider. Make sure you discuss any questions you have with your health care provider. Document Revised: 07/03/2022 Document Reviewed: 07/03/2022 Elsevier Patient Education  2024 [employer].

## 2023-03-20 NOTE — Progress Notes (Signed)
    SUBJECTIVE:   CHIEF COMPLAINT / HPI:   HTN:  Takes Lisinopril 5 mg Q daily, Last dose was this morning. Has not been checking her BP at home.  Weight management: She is currently on Adipex 37.5 mg QD with no improvement. She also exercises with a healthy diet. Despite this, she continues to gain weight.   Sinus Problem This is a recurrent problem. The problem has been waxing and waning since onset. There has been no fever. Pain scale: Sinus pressure started about 1 week ago. Associated symptoms include congestion, headaches, a hoarse voice and sinus pressure. Pertinent negatives include no coughing, ear pain, shortness of breath, sneezing or sore throat. (Reduced hearing in the left and left side/neck pain. Sometimes this triggers her vertigo. Also clear postnasal drainage. Also throat clearing. ) Treatments tried: Mucinex D 1 week ago and Tylenol sinus with mild improvement. The treatment provided mild relief.  Summer time.   PERTINENT  PMH / PSH: PMhx reviewed  OBJECTIVE:   BP (!) 157/88   Pulse 77   Ht 5\' 7"  (1.702 m)   Wt 212 lb (96.2 kg)   SpO2 100%   BMI 33.20 kg/m   Physical Exam Vitals and nursing note reviewed.  HENT:     Head: Normocephalic.     Comments: No sinus tenderness on percussion    Right Ear: Tympanic membrane and ear canal normal.     Left Ear: Tympanic membrane and ear canal normal.     Mouth/Throat:     Mouth: Mucous membranes are moist.     Pharynx: No oropharyngeal exudate or posterior oropharyngeal erythema.     Comments: No observed postnasal drip Eyes:     Extraocular Movements: Extraocular movements intact.     Conjunctiva/sclera: Conjunctivae normal.     Pupils: Pupils are equal, round, and reactive to light.  Cardiovascular:     Rate and Rhythm: Normal rate and regular rhythm.     Heart sounds: Normal heart sounds. No murmur heard. Pulmonary:     Effort: Pulmonary effort is normal. No respiratory distress.     Breath sounds: Normal  breath sounds. No wheezing.      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/C Flonase Continue Zyrtec Start Dymista Consider antibiotic if symptoms persists or worsen  She agreed with the plan  HYPERTENSION, BENIGN SYSTEMIC BP elevated despite medication compliance May be due to her Phentermine Repeat BP improved Wean of Phentermine which is not helping with her weight loss Continue current dose of Lisinopril and monitor BP closely at home while tapering off Phentermine F/u in 4-8 weeks for reassessment   BMI 33.0-33.9,adult She's gained a few more pounds since her last visit despite her compliance with a high dose of Phentermine Plan to wean off medications, especially with her now having worsening of her BP Continue lifestyle modification and f/u in 1-2 months She has hx of DM and would benefit from GLP1 injectables. However, her insurance did not cover due to improved A1C while on treatment F/U in 1-2 months for reassessment I will try to get her back on GLP1 if possible     Janit Pagan, MD [facility]

## 2023-03-20 NOTE — Assessment & Plan Note (Signed)
She's gained a few more pounds since her last visit despite her compliance with a high dose of Phentermine Plan to wean off medications, especially with her now having worsening of her BP Continue lifestyle modification and f/u in 1-2 months She has hx of DM and would benefit from GLP1 injectables. However, her insurance did not cover due to improved A1C while on treatment F/U in 1-2 months for reassessment I will try to get her back on GLP1 if possible

## 2023-03-20 NOTE — Assessment & Plan Note (Signed)
Acute on chronic Likely allergic D/C Flonase Continue Zyrtec Start Dymista Consider antibiotic if symptoms persists or worsen  She agreed with the plan

## 2023-03-20 NOTE — Assessment & Plan Note (Signed)
BP elevated despite medication compliance May be due to her Phentermine Repeat BP improved Wean of Phentermine which is not helping with her weight loss Continue current dose of Lisinopril and monitor BP closely at home while tapering off Phentermine F/u in 4-8 weeks for reassessment

## 2023-03-24 ENCOUNTER — Encounter: Payer: Self-pay | Admitting: Family Medicine

## 2023-03-25 MED ORDER — MECLIZINE HCL 25 MG PO TABS: 25.0000 mg | ORAL_TABLET | Freq: Two times a day (BID) | ORAL | 0 refills | Status: DC | PRN

## 2023-03-27 ENCOUNTER — Ambulatory Visit: Payer: BC Managed Care – PPO | Admitting: Family Medicine

## 2023-04-17 ENCOUNTER — Other Ambulatory Visit: Payer: Self-pay | Admitting: Family Medicine

## 2023-04-21 ENCOUNTER — Ambulatory Visit
Admission: RE | Admit: 2023-04-21 | Discharge: 2023-04-21 | Disposition: A | Discharge status: Home / Self Care | Payer: BC Managed Care – PPO | Source: Ambulatory Visit | Attending: Family Medicine | Admitting: Family Medicine

## 2023-04-21 DIAGNOSIS — Z1231 Encounter for screening mammogram for malignant neoplasm of breast: Secondary | ICD-10-CM | POA: Diagnosis not present

## 2023-04-22 ENCOUNTER — Ambulatory Visit: Payer: BC Managed Care – PPO | Admitting: Gastroenterology

## 2023-04-22 ENCOUNTER — Other Ambulatory Visit: Payer: Self-pay | Admitting: Family Medicine

## 2023-04-22 ENCOUNTER — Encounter: Payer: Self-pay | Admitting: Gastroenterology

## 2023-04-22 VITALS — BP 150/88 | HR 80 | Ht 66.0 in | Wt 224.0 lb

## 2023-04-22 DIAGNOSIS — R140 Abdominal distension (gaseous): Secondary | ICD-10-CM | POA: Diagnosis not present

## 2023-04-22 DIAGNOSIS — R143 Flatulence: Secondary | ICD-10-CM | POA: Diagnosis not present

## 2023-04-22 DIAGNOSIS — R928 Other abnormal and inconclusive findings on diagnostic imaging of breast: Secondary | ICD-10-CM

## 2023-04-22 DIAGNOSIS — K5909 Other constipation: Secondary | ICD-10-CM

## 2023-04-22 MED ORDER — TRULANCE 3 MG PO TABS: 3.0000 mg | ORAL_TABLET | Freq: Every day | ORAL | Status: DC

## 2023-04-22 MED ORDER — METRONIDAZOLE 250 MG PO TABS: 250.0000 mg | ORAL_TABLET | Freq: Three times a day (TID) | ORAL | 0 refills | Status: DC

## 2023-04-22 NOTE — Progress Notes (Signed)
[name] Progress Note  Chief Complaint:  Chief Complaint  Patient presents with   Gas    ongoing   Abdominal Pain    LLQ onset months, comes and goes   burping    worsening    Subjective  History: From my last office note November 2022: "[name] recently saw me for a surveillance colonoscopy, diverticulosis noted, diminutive SSP and TA removed, recall 5 years.  She had an 8 to 10 mm SSP in 2019.  At the time of recent procedure she describes severe constipation, it was noted the bowel preparation was initially fair then improved to good with lavage and plans for 2-day prep with next exam. _________________ [name] is having a BM about once a week and describes bloating, left upper quadrant pain and little improvement with herbal tea, MiraLAX and stool softener. She reports having this severe constipation for many years, and at one point think she may have had work-up for possible diverticulitis (?  CT scan) After several days of no BM or having just small pellet-like stools, she has bloating and abdominal pain as noted above.  It also then seems to affect her appetite. Denies rectal or vaginal prolapse, has not engaged in measures such as perineal pressure or manual disimpaction."  A trial of Linzess samples was given. ____________  Today, she complains of LLQ pain accompanied by bloating and belching. She is also experiencing excessive passing of gas. She states that on occasions, she would feel full despite having small meals.; On other occasions, she would have normal meals but will experience excessive gas as soon after. She can't seem to identify any specific food triggers as she tends to experience it with all foods including candy.   She states that her BM have significantly improved since her last visit. She reports typically having 2-3x BM a week which has improved compared to before which was once weekly. She occasionally experience's episode of loose or soft  stool, her last episode was yesterday. She denies feeling any protruding tissue from the rectum or vagina when having a BM.  Patient denies any nausea, blood in stool, black stool, vomiting, unintentional weight loss, reflux, or dysphagia.   ROS: Review of Systems  Constitutional:  Negative for appetite change and fever.  HENT:  Negative for trouble swallowing.   Respiratory:  Negative for cough and shortness of breath.   Cardiovascular:  Negative for chest pain.  Gastrointestinal:  Positive for abdominal distention, abdominal pain and diarrhea (loose stool). Negative for anal bleeding, blood in stool, constipation, nausea, rectal pain and vomiting.       +belching +gas  Genitourinary:  Negative for dysuria.  Musculoskeletal:  Negative for back pain.  Skin:  Negative for rash.  Neurological:  Negative for weakness.  All other systems reviewed and are negative.    The patient's Past Medical, Family and Social History were reviewed and are on file in the EMR.  Objective:  Med list reviewed  Current Outpatient Medications:    acetaminophen (TYLENOL) 500 MG tablet, Take 500 mg by mouth every 6 (six) hours as needed., Disp: , Rfl:    Azelastine-Fluticasone 137-50 MCG/ACT SUSP, PLACE 1 DOSE INTO THE NOSE 2 (TWO) TIMES DAILY., Disp: 23 g, Rfl: 1   cetirizine (ZYRTEC) 10 MG tablet, Take 10 mg by mouth daily., Disp: , Rfl:    diclofenac sodium (VOLTAREN) 1 % GEL, Apply 4 g to your back four times daily as needed for pain, Disp: 100 g,  Rfl: 0   esomeprazole (NEXIUM) 20 MG capsule, Take 1 capsule (20 mg total) by mouth daily. (Patient taking differently: Take 20 mg by mouth daily as needed.), Disp: 90 capsule, Rfl: 1   ibuprofen (ADVIL) 400 MG tablet, Take 1 tablet (400 mg total) by mouth every 8 (eight) hours as needed., Disp: 60 tablet, Rfl: 1   lisinopril (ZESTRIL) 5 MG tablet, Take 1 tablet (5 mg total) by mouth daily., Disp: 90 tablet, Rfl: 1   meclizine (ANTIVERT) 25 MG tablet, TAKE 1  TABLET (25 MG TOTAL) BY MOUTH 2 (TWO) TIMES DAILY AS NEEDED (VERTIGO)., Disp: 30 tablet, Rfl: 0   Multiple Vitamin (MULTIVITAMIN) tablet, Take 1 tablet by mouth daily. Womens Ultra Mega Vitamin, Disp: , Rfl:    rosuvastatin (CRESTOR) 10 MG tablet, TAKE 1 TABLET ON MON, WEDNESDAY AND FRIDAYS ONLY (3 TIMES WEEKLY), Disp: 12 tablet, Rfl: 3   Zinc 50 MG TABS, Take 50 mg by mouth daily., Disp: , Rfl:    Vital signs in last 24 hrs: Vitals:   04/22/23 1306  BP: (!) 150/88  Pulse: 80  SpO2: 99%   Wt Readings from Last 3 Encounters:  04/22/23 224 lb (101.6 kg)  03/20/23 212 lb (96.2 kg)  01/16/23 197 lb 6.4 oz (89.5 kg)    Physical Exam  General: well-appearing   Eyes: sclera anicteric, no redness ENT: oral mucosa moist without lesions, no cervical or supraclavicular lymphadenopathy CV: RRR, no JVD, no peripheral edema Resp: clear to auscultation bilaterally, normal RR and effort noted GI: soft, no tenderness, with active bowel sounds. No guarding or palpable organomegaly noted. Skin; warm and dry, no rash or jaundice noted Neuro: awake, alert and oriented x 3. Normal gross motor function and fluent speech   Labs:   ___________________________________________ Radiologic studies:   ____________________________________________ Other:   _____________________________________________ Assessment & Plan  Assessment: Flatulence  Abdominal bloating  Chronic constipation  Functional constipation that I believe is leading to intermittent abdominal pain bloating and gas. Belching  Less likely SIBO, but will treat empirically for that possibility Plan: -Flagyl 250 mg 3x daily for 10 days  -After finishing that course of treatment, Trulance 3 mg once daily trial (samples given) -Provided with dietary recommendations  Follow-up arranged   - Amada Jupiter, MD    [facility]   Ladona Mow M Kadhim,acting as a scribe for Charlie Pitter III, MD.,have documented all relevant documentation  on the behalf of Sherrilyn Rist, MD,as directed by  Sherrilyn Rist, MD while in the presence of Sherrilyn Rist, MD.   Marvis Repress III, MD, have reviewed all documentation for this visit. The documentation on 04/22/23 for the exam, diagnosis, procedures, and orders are all accurate and complete.

## 2023-04-22 NOTE — Progress Notes (Deleted)
[facility] Progress Note  Chief Complaint: Abdominal pain, gas and bloating  Subjective  History: From my last office note November 2022: "Tennie recently saw me for a surveillance colonoscopy, diverticulosis noted, diminutive SSP and TA removed, recall 5 years.  She had an 8 to 10 mm SSP in 2019.  At the time of recent procedure she describes severe constipation, it was noted the bowel preparation was initially fair then improved to good with lavage and plans for 2-day prep with next exam. _________________ [name] is having a BM about once a week and describes bloating, left upper quadrant pain and little improvement with herbal tea, MiraLAX and stool softener. She reports having this severe constipation for many years, and at one point think she may have had work-up for possible diverticulitis (?  CT scan) After several days of no BM or having just small pellet-like stools, she has bloating and abdominal pain as noted above.  It also then seems to affect her appetite. Denies rectal or vaginal prolapse, has not engaged in measures such as perineal pressure or manual disimpaction."  A trial of Linzess samples was given. ____________   ***  ROS: Cardiovascular:  no chest pain Respiratory: no dyspnea  The patient's Past Medical, Family and Social History were reviewed and are on file in the EMR.  Objective:  Med list reviewed  Current Outpatient Medications:    acetaminophen (TYLENOL) 500 MG tablet, Take 500 mg by mouth every 6 (six) hours as needed. (Patient not taking: Reported on 08/22/2022), Disp: , Rfl:    Azelastine-Fluticasone 137-50 MCG/ACT SUSP, PLACE 1 DOSE INTO THE NOSE 2 (TWO) TIMES DAILY., Disp: 23 g, Rfl: 1   cetirizine (ZYRTEC) 10 MG tablet, Take 10 mg by mouth daily., Disp: , Rfl:    diclofenac sodium (VOLTAREN) 1 % GEL, Apply 4 g to your back four times daily as needed for pain (Patient not taking: Reported on 08/22/2022), Disp: 100 g, Rfl: 0    esomeprazole (NEXIUM) 20 MG capsule, Take 1 capsule (20 mg total) by mouth daily. (Patient not taking: Reported on 01/16/2023), Disp: 90 capsule, Rfl: 1   ibuprofen (ADVIL) 400 MG tablet, Take 1 tablet (400 mg total) by mouth every 8 (eight) hours as needed. (Patient not taking: Reported on 10/10/2022), Disp: 60 tablet, Rfl: 1   lisinopril (ZESTRIL) 5 MG tablet, Take 1 tablet (5 mg total) by mouth daily., Disp: 90 tablet, Rfl: 1   meclizine (ANTIVERT) 25 MG tablet, TAKE 1 TABLET (25 MG TOTAL) BY MOUTH 2 (TWO) TIMES DAILY AS NEEDED (VERTIGO)., Disp: 30 tablet, Rfl: 0   Multiple Vitamin (MULTIVITAMIN) tablet, Take 1 tablet by mouth daily. Womens Ultra Mega Vitamin, Disp: , Rfl:    phentermine (ADIPEX-P) 37.5 MG tablet, Take 1 tablet (37.5 mg total) by mouth daily before breakfast., Disp: 30 tablet, Rfl: 1   rosuvastatin (CRESTOR) 10 MG tablet, TAKE 1 TABLET ON MON, WEDNESDAY AND FRIDAYS ONLY (3 TIMES WEEKLY), Disp: 12 tablet, Rfl: 3   Vital signs in last 24 hrs: There were no vitals filed for this visit. Wt Readings from Last 3 Encounters:  03/20/23 212 lb (96.2 kg)  01/16/23 197 lb 6.4 oz (89.5 kg)  10/10/22 180 lb (81.6 kg)    Physical Exam  *** HEENT: sclera anicteric, oral mucosa moist without lesions Neck: supple, no thyromegaly, JVD or lymphadenopathy Cardiac: ***,  no peripheral edema Pulm: clear to auscultation bilaterally, normal RR and effort noted Abdomen: soft, *** tenderness, with active bowel  sounds. No guarding or palpable hepatosplenomegaly. Skin; warm and dry, no jaundice or rash  Labs:   ___________________________________________ Radiologic studies:   ____________________________________________ Other:   _____________________________________________ Assessment & Plan  Assessment: No diagnosis found.    Plan:   *** minutes were spent on this encounter (including chart review, history/exam, counseling/coordination of care, and documentation) > 50% of that  time was spent on counseling and coordination of care.   Charlie Pitter III

## 2023-04-22 NOTE — Patient Instructions (Addendum)
We have sent the following medications to your pharmacy for you to pick up at your convenience: Flagyl  _______________________________________________________  Food Guidelines for those with chronic digestive trouble:  Many people have difficulty digesting certain foods, causing a variety of distressing and embarrassing symptoms such as abdominal pain, bloating and gas.  These foods may need to be avoided or consumed in small amounts.  Here are some tips that might be helpful for you.  1.   Lactose intolerance is the difficulty or complete inability to digest lactose, the natural sugar in milk and anything made from milk.  This condition is harmless, common, and can begin any time during life.  Some people can digest a modest amount of lactose while others cannot tolerate any.  Also, not all dairy products contain equal amounts of lactose.  For example, hard cheeses such as parmesan have less lactose than soft cheeses such as cheddar.  Yogurt has less lactose than milk or cheese.  Many packaged foods (even many brands of bread) have milk, so read ingredient lists carefully.  It is difficult to test for lactose intolerance, so just try avoiding lactose as much as possible for a week and see what happens with your symptoms.  If you seem to be lactose intolerant, the best plan is to avoid it (but make sure you get calcium from another source).  The next best thing is to use lactase enzyme supplements, available over the counter everywhere.  Just know that many lactose intolerant people need to take several tablets with each serving of dairy to avoid symptoms.  Lastly, a lot of restaurant food is made with milk or butter.  Many are things you might not suspect, such as mashed potatoes, rice and pasta (cooked with butter) and "grilled" items.  If you are lactose intolerant, it never hurts to ask your server what has milk or butter.  2.   Fiber is an important part of your diet, but not all fiber is  well-tolerated.  Insoluble fiber such as bran is often consumed by normal gut bacteria and converted into gas.  Soluble fiber such as oats, squash, carrots and green beans are typically tolerated better.  3.   Some types of carbohydrates can be poorly digested.  Examples include: fructose (apples, cherries, pears, raisins and other dried fruits), fructans (onions, zucchini, large amounts of wheat), sorbitol/mannitol/xylitol and sucralose/Splenda (common artificial sweeteners), and raffinose (lentils, broccoli, cabbage, asparagus, brussel sprouts, many types of beans).  Do a Programmer, multimedia for [place] and you will find helpful information. Beano, a dietary supplement, will often help with raffinose-containing foods.  As with lactase tablets, you may need several per serving.  4.   Whenever possible, avoid processed food&meats and chemical additives.  High fructose corn syrup, a common sweetener, may be difficult to digest.  Eggs and soy (comes from the soybean, and added to many foods now) are other common bloating/gassy foods.  5.  Regarding gluten:  gluten is a protein mainly found in wheat, but also rye and barley.  There is a condition called celiac sprue, which is an inflammatory reaction in the small intestine causing a variety of digestive symptoms.  Blood testing is highly reliable to look for this condition, and sometimes upper endoscopy with small bowel biopsies may be necessary to make the diagnosis.  Many patients who test negative for celiac sprue report improvement in their digestive symptoms when they switch to a gluten-free diet.  However, in these "non-celiac gluten sensitive" patients,  the true role of gluten in their symptoms is unclear.  Reducing carbohydrates in general may decrease the gas and bloating caused when gut bacteria consume carbs. Also, some of these patients may actually be intolerant of the baker's yeast in bread products rather than the gluten.  Flatbread and other reduced  yeast breads might therefore be tolerated.  There is no specific testing available for most food intolerances, which are discovered mainly by dietary elimination.  Please do not embark on a gluten free diet unless directed by your doctor, as it is highly restrictive, and may lead to nutritional deficiencies if not carefully monitored.  Lastly, beware of internet claims offering "personalized" tests for food intolerances.  Such testing has no reliable scientific evidence to support its reliability and correlation to symptoms.    6.  The best advice is old advice, especially for those with chronic digestive trouble - try to eat "clean".  Balanced diet, avoid processed food, plenty of fruits and vegetables, cut down the sugar, minimal alcohol, avoid tobacco. Make time to care for yourself, get enough sleep, exercise when you can, reduce stress.  Your guts will thank you for it.   - Dr. [facility]  ____________________________________________________________      _______________________________________________________  If your blood pressure at your visit was 140/90 or greater, please contact your primary care physician to follow up on this.  _______________________________________________________  If you are age 65 or older, your body mass index should be between 23-30. Your Body mass index is 36.15 kg/m. If this is out of the aforementioned range listed, please consider follow up with your Primary Care Provider.  If you are age 45 or younger, your body mass index should be between 19-25. Your Body mass index is 36.15 kg/m. If this is out of the aformentioned range listed, please consider follow up with your Primary Care Provider.   ________________________________________________________  The [facility] providers would like to encourage you to use [facility] to communicate with providers for non-urgent requests or questions.  Due to long hold times on the telephone,  sending your provider a message by [facility] may be a faster and more efficient way to get a response.  Please allow 48 business hours for a response.  Please remember that this is for non-urgent requests.  _______________________________________________________ It was a pleasure to see you today!  Thank you for trusting me with your gastrointestinal care!

## 2023-04-23 DIAGNOSIS — M654 Radial styloid tenosynovitis [de Quervain]: Secondary | ICD-10-CM | POA: Diagnosis not present

## 2023-05-05 ENCOUNTER — Other Ambulatory Visit: Payer: Self-pay | Admitting: Family Medicine

## 2023-05-05 DIAGNOSIS — R0982 Postnasal drip: Secondary | ICD-10-CM

## 2023-05-06 DIAGNOSIS — Z947 Corneal transplant status: Secondary | ICD-10-CM | POA: Diagnosis not present

## 2023-05-06 DIAGNOSIS — Z961 Presence of intraocular lens: Secondary | ICD-10-CM | POA: Diagnosis not present

## 2023-05-06 DIAGNOSIS — H40003 Preglaucoma, unspecified, bilateral: Secondary | ICD-10-CM | POA: Diagnosis not present

## 2023-05-07 ENCOUNTER — Ambulatory Visit: Payer: BC Managed Care – PPO

## 2023-05-07 ENCOUNTER — Ambulatory Visit
Admission: RE | Admit: 2023-05-07 | Discharge: 2023-05-07 | Disposition: A | Discharge status: Home / Self Care | Payer: BC Managed Care – PPO | Source: Ambulatory Visit | Attending: Family Medicine | Admitting: Family Medicine

## 2023-05-07 DIAGNOSIS — R928 Other abnormal and inconclusive findings on diagnostic imaging of breast: Secondary | ICD-10-CM | POA: Diagnosis not present

## 2023-05-12 ENCOUNTER — Ambulatory Visit: Payer: BC Managed Care – PPO | Admitting: Family Medicine

## 2023-05-12 ENCOUNTER — Other Ambulatory Visit: Payer: Self-pay | Admitting: Family Medicine

## 2023-05-19 DIAGNOSIS — M19031 Primary osteoarthritis, right wrist: Secondary | ICD-10-CM | POA: Diagnosis not present

## 2023-05-19 DIAGNOSIS — M19032 Primary osteoarthritis, left wrist: Secondary | ICD-10-CM | POA: Diagnosis not present

## 2023-05-20 ENCOUNTER — Encounter: Payer: Self-pay | Admitting: Gastroenterology

## 2023-05-21 MED ORDER — TRULANCE 3 MG PO TABS: 3.0000 mg | ORAL_TABLET | Freq: Every day | ORAL | 1 refills | Status: DC

## 2023-05-21 NOTE — Telephone Encounter (Signed)
Prescription sent for Trulance 3 mg once daily  EHR note suggests her insurance might not cover this is a nonpreferred formulary item.  Alternatives include Amitiza, Linzess, Motegrity.  She failed Linzess, which should be noted in case prior authorization is required.  H Danis

## 2023-05-22 ENCOUNTER — Encounter: Payer: Self-pay | Admitting: Family Medicine

## 2023-05-22 ENCOUNTER — Ambulatory Visit: Payer: BC Managed Care – PPO | Admitting: Family Medicine

## 2023-05-22 VITALS — BP 138/80 | HR 69 | Ht 66.0 in | Wt 232.4 lb

## 2023-05-22 DIAGNOSIS — E119 Type 2 diabetes mellitus without complications: Secondary | ICD-10-CM

## 2023-05-22 DIAGNOSIS — Z6837 Body mass index (BMI) 37.0-37.9, adult: Secondary | ICD-10-CM

## 2023-05-22 DIAGNOSIS — R7303 Prediabetes: Secondary | ICD-10-CM | POA: Insufficient documentation

## 2023-05-22 DIAGNOSIS — I10 Essential (primary) hypertension: Secondary | ICD-10-CM | POA: Diagnosis not present

## 2023-05-22 DIAGNOSIS — R202 Paresthesia of skin: Secondary | ICD-10-CM | POA: Insufficient documentation

## 2023-05-22 LAB — POCT GLYCOSYLATED HEMOGLOBIN (HGB A1C): HbA1c, POC (controlled diabetic range): 6.4 % (ref 0.0–7.0)

## 2023-05-22 NOTE — Assessment & Plan Note (Signed)
A1C consistently in the pre-DM range Will change diagnosis to prediabetes

## 2023-05-22 NOTE — Assessment & Plan Note (Signed)
BP looks great on her current regimen No med dose adjustment is needed

## 2023-05-22 NOTE — Patient Instructions (Signed)
Calorie Counting for Weight Loss Calories are units of energy. Your body needs a certain number of calories from food to keep going throughout the day. When you eat or drink more calories than your body needs, your body stores the extra calories mostly as fat. When you eat or drink fewer calories than your body needs, your body burns fat to get the energy it needs. Calorie counting means keeping track of how many calories you eat and drink each day. Calorie counting can be helpful if you need to lose weight. If you eat fewer calories than your body needs, you should lose weight. Ask your health care provider what a healthy weight is for you. For calorie counting to work, you will need to eat the right number of calories each day to lose a healthy amount of weight per week. A dietitian can help you figure out how many calories you need in a day and will suggest ways to reach your calorie goal. A healthy amount of weight to lose each week is usually 1-2 lb (0.5-0.9 kg). This usually means that your daily calorie intake should be reduced by 500-750 calories. Eating 1,200-1,500 calories a day can help most women lose weight. Eating 1,500-1,800 calories a day can help most men lose weight. What do I need to know about calorie counting? Work with your health care provider or dietitian to determine how many calories you should get each day. To meet your daily calorie goal, you will need to: Find out how many calories are in each food that you would like to eat. Try to do this before you eat. Decide how much of the food you plan to eat. Keep a food log. Do this by writing down what you ate and how many calories it had. To successfully lose weight, it is important to balance calorie counting with a healthy lifestyle that includes regular activity. Where do I find calorie information?  The number of calories in a food can be found on a Nutrition Facts label. If a food does not have a Nutrition Facts label, try  to look up the calories online or ask your dietitian for help. Remember that calories are listed per serving. If you choose to have more than one serving of a food, you will have to multiply the calories per serving by the number of servings you plan to eat. For example, the label on a package of bread might say that a serving size is 1 slice and that there are 90 calories in a serving. If you eat 1 slice, you will have eaten 90 calories. If you eat 2 slices, you will have eaten 180 calories. How do I keep a food log? After each time that you eat, record the following in your food log as soon as possible: What you ate. Be sure to include toppings, sauces, and other extras on the food. How much you ate. This can be measured in cups, ounces, or number of items. How many calories were in each food and drink. The total number of calories in the food you ate. Keep your food log near you, such as in a pocket-sized notebook or on an app or website on your mobile phone. Some programs will calculate calories for you and show you how many calories you have left to meet your daily goal. What are some portion-control tips? Know how many calories are in a serving. This will help you know how many servings you can have of a certain   food. Use a measuring cup to measure serving sizes. You could also try weighing out portions on a kitchen scale. With time, you will be able to estimate serving sizes for some foods. Take time to put servings of different foods on your favorite plates or in your favorite bowls and cups so you know what a serving looks like. Try not to eat straight from a food's packaging, such as from a bag or box. Eating straight from the package makes it hard to see how much you are eating and can lead to overeating. Put the amount you would like to eat in a cup or on a plate to make sure you are eating the right portion. Use smaller plates, glasses, and bowls for smaller portions and to prevent  overeating. Try not to multitask. For example, avoid watching TV or using your computer while eating. If it is time to eat, sit down at a table and enjoy your food. This will help you recognize when you are full. It will also help you be more mindful of what and how much you are eating. What are tips for following this plan? Reading food labels Check the calorie count compared with the serving size. The serving size may be smaller than what you are used to eating. Check the source of the calories. Try to choose foods that are high in protein, fiber, and vitamins, and low in saturated fat, trans fat, and sodium. Shopping Read nutrition labels while you shop. This will help you make healthy decisions about which foods to buy. Pay attention to nutrition labels for low-fat or fat-free foods. These foods sometimes have the same number of calories or more calories than the full-fat versions. They also often have added sugar, starch, or salt to make up for flavor that was removed with the fat. Make a grocery list of lower-calorie foods and stick to it. Cooking Try to cook your favorite foods in a healthier way. For example, try baking instead of frying. Use low-fat dairy products. Meal planning Use more fruits and vegetables. One-half of your plate should be fruits and vegetables. Include lean proteins, such as chicken, turkey, and fish. Lifestyle Each week, aim to do one of the following: 150 minutes of moderate exercise, such as walking. 75 minutes of vigorous exercise, such as running. General information Know how many calories are in the foods you eat most often. This will help you calculate calorie counts faster. Find a way of tracking calories that works for you. Get creative. Try different apps or programs if writing down calories does not work for you. What foods should I eat?  Eat nutritious foods. It is better to have a nutritious, high-calorie food, such as an avocado, than a food with  few nutrients, such as a bag of potato chips. Use your calories on foods and drinks that will fill you up and will not leave you hungry soon after eating. Examples of foods that fill you up are nuts and nut butters, vegetables, lean proteins, and high-fiber foods such as whole grains. High-fiber foods are foods with more than 5 g of fiber per serving. Pay attention to calories in drinks. Low-calorie drinks include water and unsweetened drinks. The items listed above may not be a complete list of foods and beverages you can eat. Contact a dietitian for more information. What foods should I limit? Limit foods or drinks that are not good sources of vitamins, minerals, or protein or that are high in unhealthy fats. These   include: Candy. Other sweets. Sodas, specialty coffee drinks, alcohol, and juice. The items listed above may not be a complete list of foods and beverages you should avoid. Contact a dietitian for more information. How do I count calories when eating out? Pay attention to portions. Often, portions are much larger when eating out. Try these tips to keep portions smaller: Consider sharing a meal instead of getting your own. If you get your own meal, eat only half of it. Before you start eating, ask for a container and put half of your meal into it. When available, consider ordering smaller portions from the menu instead of full portions. Pay attention to your food and drink choices. Knowing the way food is cooked and what is included with the meal can help you eat fewer calories. If calories are listed on the menu, choose the lower-calorie options. Choose dishes that include vegetables, fruits, whole grains, low-fat dairy products, and lean proteins. Choose items that are boiled, broiled, grilled, or steamed. Avoid items that are buttered, battered, fried, or served with cream sauce. Items labeled as crispy are usually fried, unless stated otherwise. Choose water, low-fat milk,  unsweetened iced tea, or other drinks without added sugar. If you want an alcoholic beverage, choose a lower-calorie option, such as a glass of wine or light beer. Ask for dressings, sauces, and syrups on the side. These are usually high in calories, so you should limit the amount you eat. If you want a salad, choose a garden salad and ask for grilled meats. Avoid extra toppings such as bacon, cheese, or fried items. Ask for the dressing on the side, or ask for olive oil and vinegar or lemon to use as dressing. Estimate how many servings of a food you are given. Knowing serving sizes will help you be aware of how much food you are eating at restaurants. Where to find more information Centers for Disease Control and Prevention: www.cdc.gov U.[place]. Department of Agriculture: myplate.gov Summary Calorie counting means keeping track of how many calories you eat and drink each day. If you eat fewer calories than your body needs, you should lose weight. A healthy amount of weight to lose per week is usually 1-2 lb (0.5-0.9 kg). This usually means reducing your daily calorie intake by 500-750 calories. The number of calories in a food can be found on a Nutrition Facts label. If a food does not have a Nutrition Facts label, try to look up the calories online or ask your dietitian for help. Use smaller plates, glasses, and bowls for smaller portions and to prevent overeating. Use your calories on foods and drinks that will fill you up and not leave you hungry shortly after a meal. This information is not intended to replace advice given to you by your health care provider. Make sure you discuss any questions you have with your health care provider. Document Revised: 07/28/2019 Document Reviewed: 07/28/2019 Elsevier Patient Education  2023 Elsevier Inc.  

## 2023-05-22 NOTE — Assessment & Plan Note (Signed)
Right great and second toe paresthesia - intermittent Currently asymptomatic ?? Nerve irritation Since it is been only weeks with no neurologic deficit on exam, we will monitor for now Consider TSH, Vit B12 in the future with nerve stimulation testing if it worsens She agreed with the plan

## 2023-05-22 NOTE — Assessment & Plan Note (Signed)
Doing well with exercise  Working on improving her diet Will like to trial the GLP1 receptor agonist for weight loss I doubt her insurance will cover it I will reach out to our pharmacy tech about this She declined dietitian referral for now

## 2023-05-22 NOTE — Progress Notes (Signed)
    SUBJECTIVE:   CHIEF COMPLAINT / HPI:   HTN/DM2: Here for f/u. No new concern. She takes her Lisinopril 5 mg every day. Diet control for DM2.  Weight management:  She goes to the Gym 4 times weekly. She stated that she need to improve on her diet.  Toe tingling sensation: C/O tingling sensation of her right great and 2nd toes x  2 weeks. This occurs only when she moves her toes certain way. She denies any trauma or injury to her toes, no swelling or redness.   PERTINENT  PMH / PSH: PMHx reviewed  OBJECTIVE:   BP 138/80   Pulse 69   Ht 5\' 6"  (1.676 m)   Wt 232 lb 6.4 oz (105.4 kg)   SpO2 97%   BMI 37.51 kg/m   Physical Exam Vitals and nursing note reviewed.  Cardiovascular:     Rate and Rhythm: Normal rate and regular rhythm.     Pulses:          Dorsalis pedis pulses are 2+ on the right side and 2+ on the left side.       Posterior tibial pulses are 2+ on the right side and 2+ on the left side.     Heart sounds: Normal heart sounds. No murmur heard. Pulmonary:     Effort: Pulmonary effort is normal. No respiratory distress.     Breath sounds: Normal breath sounds. No wheezing.  Feet:     Right foot:     Skin integrity: Skin integrity normal.     Toenail Condition: Right toenails are normal.     Left foot:     Skin integrity: Skin integrity normal.     Toenail Condition: Left toenails are normal.     Comments: Normal right foot and toe sensation. No swelling or erythema     ASSESSMENT/PLAN:   HYPERTENSION, BENIGN SYSTEMIC BP looks great on her current regimen No med dose adjustment is needed  Diabetes mellitus (HCC) A1C consistently in the pre-DM range Will change diagnosis to prediabetes  BMI 37.0-37.9, adult Doing well with exercise  Working on improving her diet Will like to trial the GLP1 receptor agonist for weight loss I doubt her insurance will cover it I will reach out to our pharmacy tech about this She declined dietitian referral for  now  Paresthesia Right great and second toe paresthesia - intermittent Currently asymptomatic ?? Nerve irritation Since it is been only weeks with no neurologic deficit on exam, we will monitor for now Consider TSH, Vit B12 in the future with nerve stimulation testing if it worsens She agreed with the plan     Janit Pagan, MD [facility]

## 2023-05-27 ENCOUNTER — Other Ambulatory Visit: Payer: Self-pay | Admitting: Family Medicine

## 2023-05-27 ENCOUNTER — Other Ambulatory Visit (HOSPITAL_COMMUNITY): Payer: Self-pay

## 2023-05-27 MED ORDER — SEMAGLUTIDE-WEIGHT MANAGEMENT 0.25 MG/0.5ML ~~LOC~~ SOAJ: 0.2500 mg | SUBCUTANEOUS | 0 refills | Status: DC

## 2023-06-03 ENCOUNTER — Encounter: Payer: Self-pay | Admitting: Family Medicine

## 2023-06-03 ENCOUNTER — Other Ambulatory Visit: Payer: Self-pay | Admitting: Family Medicine

## 2023-06-04 ENCOUNTER — Encounter: Payer: Self-pay | Admitting: Family Medicine

## 2023-06-04 ENCOUNTER — Telehealth: Payer: Self-pay

## 2023-06-04 ENCOUNTER — Other Ambulatory Visit (HOSPITAL_COMMUNITY): Payer: Self-pay

## 2023-06-04 NOTE — Telephone Encounter (Signed)
Pharmacy Patient Advocate Encounter   Received notification from RX Request Messages that prior authorization for [facility] is required/requested.   The patient is insured through [facility] .   PA required; PA submitted to above mentioned insurance via CoverMyMeds Key/confirmation #/EOC [id number] Status is pending

## 2023-06-04 NOTE — Telephone Encounter (Signed)
Pharmacy Patient Advocate Encounter  Received notification from [facility] that Prior Authorization for [facility] has been DENIED.  Full denial letter will be uploaded to the media tab. See denial reason below.  Medication not covered by plan

## 2023-06-08 ENCOUNTER — Other Ambulatory Visit: Payer: Self-pay | Admitting: Family Medicine

## 2023-06-08 DIAGNOSIS — R0982 Postnasal drip: Secondary | ICD-10-CM

## 2023-06-09 DIAGNOSIS — M654 Radial styloid tenosynovitis [de Quervain]: Secondary | ICD-10-CM | POA: Diagnosis not present

## 2023-06-11 ENCOUNTER — Other Ambulatory Visit: Payer: Self-pay | Admitting: Family Medicine

## 2023-06-11 MED ORDER — LISINOPRIL 5 MG PO TABS: 5.0000 mg | ORAL_TABLET | Freq: Every day | ORAL | 1 refills | Status: DC

## 2023-06-18 ENCOUNTER — Encounter: Payer: Self-pay | Admitting: Family Medicine

## 2023-07-15 ENCOUNTER — Encounter: Payer: Self-pay | Admitting: Family Medicine

## 2023-07-16 ENCOUNTER — Other Ambulatory Visit: Payer: Self-pay | Admitting: Family Medicine

## 2023-07-23 ENCOUNTER — Ambulatory Visit: Payer: BC Managed Care – PPO | Admitting: Nurse Practitioner

## 2023-07-23 ENCOUNTER — Encounter: Payer: Self-pay | Admitting: Nurse Practitioner

## 2023-07-23 VITALS — BP 124/86 | HR 77 | Ht 66.0 in | Wt 242.1 lb

## 2023-07-23 DIAGNOSIS — R143 Flatulence: Secondary | ICD-10-CM

## 2023-07-23 DIAGNOSIS — K5909 Other constipation: Secondary | ICD-10-CM

## 2023-07-23 DIAGNOSIS — Z860101 Personal history of adenomatous and serrated colon polyps: Secondary | ICD-10-CM | POA: Diagnosis not present

## 2023-07-23 MED ORDER — LUBIPROSTONE 8 MCG PO CAPS: 8.0000 ug | ORAL_CAPSULE | Freq: Two times a day (BID) | ORAL | 5 refills | Status: AC

## 2023-07-23 NOTE — Progress Notes (Signed)
____________________________________________________________  Attending physician addendum:  Thank you for sending this case to me. I have reviewed the entire note and agree with the plan.   Malyssa Maris Danis, MD  ____________________________________________________________  

## 2023-07-23 NOTE — Progress Notes (Signed)
ASSESSMENT    Brief Narrative:  58 y.o.  female known to Dr. Myrtie Neither  with a past medical history not limited to diverticulosis, colon polyps, chronic constipation  Chronic constipation Failed Linzess.  Trulance worked but caused prohibitive.  Constipation improved but not resolved after starting protein shakes.   Excessive flatus, malodorous.  This is affecting her quality of life. No improvement with empiric flagyl.  No known dietary triggers.  She avoidss dairy and limits cruciferous veggies. Despite lack of improvement with flagyl she could possibly still have SIBO but be methane predominant which could also be contributing to constipation   History of colon polyps (tubular adenomas and sessile serrated) October 2022. Five 5-year surveillance colonoscopy recommended  See [facility] for any additional medical & surgical history   PLAN   --Trial of Amitiza 8 mcg twice daily #60 with 5 refills --If flatulence doesn't improve with treatment of constipation consider testing for SIBO or treating empirically as a methane producer with Xifaxan plus Neomycin.   HPI   Chief complaint : Excessive gas  Brief GI History Cynthia Wilson has a history of chronic, severe constipation.  Previously took Linzess which worked for a while but then lost efficacy .  She was last seen here 04/22/2023 .  At the time of that visit she complained of intermittent abdominal pain bloating , gas and ongoing constipation.  SIBO felt unlikely but she was treated empirically with 10 days of metronidazole.  Following the course of antibiotics plan was for her to start Trulance 3 mg daily and samples were given.  She had no improvement in symptoms with a course of metronidazole.  Trulance worked great for constipation but didn't help the excessive gas at all and she cannot afford the Rx.  Not currently taking anything for constipation. Started a protein shake 2 weeks ago and since then having 2 BMs a day though BMs small and she  has sensation of incomplete evacuation.   The excessive flatus is life altering for her. Difficult to go in public. Flatulence often smells like eggs. She doesn't eat beans, brocolli or cabbage very often. She has tried gas-x , Rolaids and Tums but those don't help.    GI History / Pertinent GI Studies   **All endoscopic studies may not be included here    October 2022 colonoscopy - One 5 mm polyp in the proximal ascending colon, removed piecemeal using a cold biopsy forceps. Resected and retrieved. - One 5 mm polyp in the proximal transverse colon, removed with a cold snare. Resected and retrieved. - Diverticulosis in the left colon. - The examination was otherwise normal on direct and retroflexion views. Diagnosis 1. Surgical [P], colon, transverse and ascending, polyp (2) - SESSILE SERRATED POLYP (2 OF 3 FRAGMENTS) - BENIGN COLONIC MUCOSA (1 OF 3 FRAGMENTS) - NO HIGH-GRADE DYSPLASIA OR MALIGNANCY IDENTIFIED 2. Surgical [P], colon, transverse, polyp (1) - TUBULAR ADENOMA (1 OF 1 FRAGMENTS) - NO HIGH-GRADE DYSPLASIA OR MALIGNANCY IDENTIFIED       Latest Ref Rng & Units 08/16/2021   10:39 AM 07/31/2020   10:12 AM 09/12/2014    3:23 PM  Hepatic Function  Total Protein 6.0 - 8.5 g/dL 6.4  6.6  6.4   Albumin 3.8 - 4.9 g/dL 4.0  4.2  3.8   AST 0 - 40 IU/L 15  29  18    ALT 0 - 32 IU/L 10  18  13    Alk Phosphatase 44 - 121 IU/L 77  72  58   Total Bilirubin 0.0 - 1.2 mg/dL 0.5  0.3  0.4        Latest Ref Rng & Units 06/09/2016    6:03 PM 09/12/2014    3:23 PM 10/21/2013    7:42 AM  CBC  WBC 4.0 - 10.5 K/uL 8.7  5.5    Hemoglobin 12.0 - 15.0 g/dL 91.4  78.2  95.6   Hematocrit 36.0 - 46.0 % 36.8  36.8    Platelets 150 - 400 K/uL 346  383       Past Medical History:  Diagnosis Date   Allergy    Arthritis    GERD (gastroesophageal reflux disease)    Heart murmur    Heart murmur 02/01/2013   History of osteoarthritis    left knee   Hypertension    under control with med., has  been on med. x 5 yr.   Immature cataract 2021   removed bilaterally   KNEE PAIN, LEFT, CHRONIC 07/30/2009   Seen by [facility] 03/31/12,DJD,  steroid injection    Medial meniscus tear 09/2013   right knee   Non-insulin dependent type 2 diabetes mellitus (HCC)    off metformin- diet controlled per pt    Pelvic peritoneal adhesions, female 04/21/2013   Recurrent tonsillitis 12/01/2016   Sinus headache 10/30/2015   Vertigo 02/07/2011   Wears partial dentures    upper    Past Surgical History:  Procedure Laterality Date   CATARACT EXTRACTION Right 01/2020   CATARACT EXTRACTION Left 11/2019   COLONOSCOPY  2009   normal    CORNEAL TRANSPLANT Right 2022   CYST EXCISION  03/25/2007   resection peritoneal inclusion cyst   HERNIA REPAIR     JOINT REPLACEMENT Left    knee   KNEE ARTHROSCOPY Left 2011   KNEE ARTHROSCOPY Right 10/21/2013   Procedure: ARTHROSCOPY RIGHT KNEE;  Surgeon: Harvie Junior, MD;  Location: [facility];  Service: Orthopedics;  Laterality: Right;  medial , lateral and patella femoral chondromalsia and medial plica   LAPAROSCOPY N/A 04/21/2013   Procedure: LAPAROSCOPY DIAGNOSTIC;  Surgeon: Willodean Rosenthal, MD;  Location: [facility];  Service: Gynecology;  Laterality: N/A;   LAPAROTOMY N/A 04/21/2013   Procedure: LAPAROTOMY;  Surgeon: Willodean Rosenthal, MD;  Location: [facility];  Service: Gynecology;  Laterality: N/A;   LYSIS OF ADHESION  08/28/2005; 03/25/2007; 04/21/2013   POLYPECTOMY     SALPINGOOPHORECTOMY Left 04/21/2013   Procedure: SALPINGO OOPHORECTOMY;  Surgeon: Willodean Rosenthal, MD;  Location: [facility];  Service: Gynecology;  Laterality: Left;   TOTAL ABDOMINAL HYSTERECTOMY  08/28/2005   TOTAL KNEE ARTHROPLASTY Left 02/11/2013   Procedure: LEFT TOTAL KNEE ARTHROPLASTY;  Surgeon: Harvie Junior, MD;  Location: [facility];  Service: Orthopedics;  Laterality: Left;  NO COMPUTER   UMBILICAL HERNIA REPAIR  1984   UNILATERAL SALPINGECTOMY Right  08/28/2005    Family History  Problem Relation Age of Onset   Diabetes Mother    Hypertension Mother    Hyperlipidemia Mother    Colon polyps Mother    Emphysema Father    Breast cancer Cousin 71   Colon polyps Sister    Esophageal cancer Brother    Colon cancer Neg Hx    Rectal cancer Neg Hx    Stomach cancer Neg Hx     Current Medications, Allergies, Family History and Social History were reviewed in [employer] electronic medical record.     Current Outpatient Medications  Medication Sig Dispense Refill  acetaminophen (TYLENOL) 500 MG tablet Take 500 mg by mouth every 6 (six) hours as needed.     cetirizine (ZYRTEC) 10 MG tablet Take 10 mg by mouth daily.     diclofenac sodium (VOLTAREN) 1 % GEL Apply 4 g to your back four times daily as needed for pain 100 g 0   esomeprazole (NEXIUM) 20 MG capsule Take 1 capsule (20 mg total) by mouth daily. (Patient taking differently: Take 20 mg by mouth as needed.) 90 capsule 1   guaiFENesin (MUCINEX) 600 MG 12 hr tablet Take 600 mg by mouth 2 (two) times daily as needed.     lisinopril (ZESTRIL) 5 MG tablet Take 1 tablet (5 mg total) by mouth daily. 90 tablet 1   meclizine (ANTIVERT) 25 MG tablet TAKE 1 TABLET (25 MG TOTAL) BY MOUTH 2 (TWO) TIMES DAILY AS NEEDED (VERTIGO). 30 tablet 0   meloxicam (MOBIC) 15 MG tablet Take 15 mg by mouth.     Multiple Vitamin (MULTIVITAMIN) tablet Take 1 tablet by mouth daily. Womens Ultra Mega Vitamin     Azelastine-Fluticasone 137-50 MCG/ACT SUSP PLACE 1 DOSE INTO THE NOSE 2 (TWO) TIMES DAILY. (Patient not taking: Reported on 07/23/2023) 23 g 1   fluticasone (FLONASE) 50 MCG/ACT nasal spray Place 2 sprays into both nostrils daily.     ibuprofen (ADVIL) 400 MG tablet Take 1 tablet (400 mg total) by mouth every 8 (eight) hours as needed. (Patient not taking: Reported on 07/23/2023) 60 tablet 1   prednisoLONE acetate (PRED FORTE) 1 % ophthalmic suspension Place 1 drop into both eyes daily.      Semaglutide-Weight Management 0.25 MG/0.5ML SOAJ Inject 0.25 mg into the skin once a week. (Patient not taking: Reported on 07/23/2023) 2 mL 0   Zinc 50 MG TABS Take 50 mg by mouth daily. (Patient not taking: Reported on 07/23/2023)     No current facility-administered medications for this visit.    Review of Systems: No chest pain. No shortness of breath. No urinary complaints.    Physical Exam  There were no vitals filed for this visit. Wt Readings from Last 3 Encounters:  05/22/23 232 lb 6.4 oz (105.4 kg)  04/22/23 224 lb (101.6 kg)  03/20/23 212 lb (96.2 kg)    BP 124/86 (BP Location: Left Arm, Patient Position: Sitting, Cuff Size: Normal)   Pulse 77   Ht 5\' 6"  (1.676 m)   Wt 242 lb 2 oz (109.8 kg)   SpO2 95%   BMI 39.08 kg/m  Constitutional:  Pleasant, generally well appearing female in no acute distress. Psychiatric: Normal mood and affect. Behavior is normal. EENT: Pupils normal.  Conjunctivae are normal. No scleral icterus. Neck supple.  Cardiovascular: Normal rate, regular rhythm. Soft murmur Pulmonary/chest: Effort normal and breath sounds normal. No wheezing, rales or rhonchi. Abdominal: Soft, nondistended, nontender. Bowel sounds active throughout. There are no masses palpable. No hepatomegaly. Neurological: Alert and oriented to person place and time.    Willette Cluster, NP  07/23/2023, 3:20 PM  Cc:  Doreene Eland, MD

## 2023-07-23 NOTE — Patient Instructions (Signed)
We have sent the following medications to your pharmacy for you to pick up at your convenience: Amitiza   _______________________________________________________  If your blood pressure at your visit was 140/90 or greater, please contact your primary care physician to follow up on this.  _______________________________________________________  If you are age 60 or older, your body mass index should be between 23-30. Your Body mass index is 39.08 kg/m. If this is out of the aforementioned range listed, please consider follow up with your Primary Care Provider.  If you are age 15 or younger, your body mass index should be between 19-25. Your Body mass index is 39.08 kg/m. If this is out of the aformentioned range listed, please consider follow up with your Primary Care Provider.   ________________________________________________________  The [facility] providers would like to encourage you to use [facility] to communicate with providers for non-urgent requests or questions.  Due to long hold times on the telephone, sending your provider a message by [facility] may be a faster and more efficient way to get a response.  Please allow 48 business hours for a response.  Please remember that this is for non-urgent requests.  _______________________________________________________  Thank you for choosing me and [facility].  Willette Cluster, NP

## 2023-07-28 DIAGNOSIS — M654 Radial styloid tenosynovitis [de Quervain]: Secondary | ICD-10-CM | POA: Diagnosis not present

## 2023-08-01 ENCOUNTER — Other Ambulatory Visit: Payer: Self-pay | Admitting: Family Medicine

## 2023-08-06 DIAGNOSIS — H40013 Open angle with borderline findings, low risk, bilateral: Secondary | ICD-10-CM | POA: Diagnosis not present

## 2023-08-06 LAB — HM DIABETES EYE EXAM

## 2023-08-07 ENCOUNTER — Encounter: Payer: Self-pay | Admitting: Family Medicine

## 2023-10-07 ENCOUNTER — Ambulatory Visit: Payer: BC Managed Care – PPO | Admitting: Internal Medicine

## 2023-10-07 ENCOUNTER — Encounter: Payer: Self-pay | Admitting: Internal Medicine

## 2023-10-07 VITALS — BP 138/80 | HR 70 | Ht 66.0 in | Wt 236.8 lb

## 2023-10-07 DIAGNOSIS — E0510 Thyrotoxicosis with toxic single thyroid nodule without thyrotoxic crisis or storm: Secondary | ICD-10-CM | POA: Diagnosis not present

## 2023-10-07 DIAGNOSIS — E0590 Thyrotoxicosis, unspecified without thyrotoxic crisis or storm: Secondary | ICD-10-CM

## 2023-10-07 DIAGNOSIS — E042 Nontoxic multinodular goiter: Secondary | ICD-10-CM

## 2023-10-07 NOTE — Patient Instructions (Signed)
 Please stop at the lab.  Please come back for a follow-up appointment in 1 year.

## 2023-10-07 NOTE — Progress Notes (Signed)
 Patient ID: Cynthia Wilson, female   DOB: 1966-02-27, 58 y.o.   MRN: [id number]  HPI  Cynthia Wilson is a 58 y.o.-year-old female, referred by her PCP, Dr. Lum Babe, in consultation for subclinical thyrotoxicosis and goiter.  Last visit 1 year ago.  Interim history: She denies tremors, palpitations, weight loss, anxiety. She does have hot flushes - chronic. Also, hair loss. Her constipation improved - Prev. tried Linzess >> did not work. She had a colonoscopy - had diverticulosis.  Now on Amitiza. She was on semaglutide intercepted diet in the past.  However, constipation started before starting these. She gained ~50 lbs since last OV, after stopping Mounjaro 2/2 insurance stopping coverage last summer. Now back on Ozempic - she started to lose weight again.  Reviewed and addended history: She was diagnosed with a multinodular goiter in 2004.  I reviewed her TFTs: Lab Results  Component Value Date   TSH 0.18 (L) 10/07/2022   TSH 0.28 (L) 04/08/2022   TSH 0.44 10/09/2021   TSH 0.14 (L) 04/08/2021   TSH 0.16 (L) 09/26/2020   TSH 0.192 (L) 07/31/2020   TSH 0.496 04/28/2018   TSH 0.22 (L) 07/25/2016   TSH 0.69 07/25/2014   TSH 0.268 (L) 07/07/2014   FREET4 0.73 10/07/2022   FREET4 0.84 04/08/2022   FREET4 0.81 10/09/2021   FREET4 0.76 04/08/2021   FREET4 0.81 09/26/2020   FREET4 0.9 07/25/2016   FREET4 0.77 07/25/2014   FREET4 1.00 07/07/2014   Lab Results  Component Value Date   T3FREE 3.2 10/07/2022   T3FREE 3.5 04/08/2022   T3FREE 3.3 10/09/2021   T3FREE 3.3 04/08/2021   T3FREE 2.9 09/26/2020   T3FREE 3.0 07/25/2016   T3FREE 3.2 07/25/2014   T3FREE 3.4 07/07/2014  TPO antibodies were normal.  Graves' antibodies were not elevated: Lab Results  Component Value Date   TSI <89 09/26/2020   Previous investigation: She had a Thyroid scan in 2004: 1.  NORMAL RIGHT LOBE OF THE THYROID. 2.  THREE COLD NODULES OF THE LEFT LOBE.    She had a thyroid U/S  (04/08/2003): 1. DOMINANT 3.3 CM IN DIAMETER COMPLEX MASS CONTAINING CYSTIC AND SOLID COMPONENTS INVOLVING THE MAJORITY OF THE MID AND LOWER POLE OF THE LEFT LOBE OF THYROID CORRESPONDING TO THE COLD NODULE SEEN IN THIS REGION ON NUCLEAR MEDICINE STUDY.   2. THERE IS A SECOND 1.6 CM SIZE COMPLEX NODULE ASSOCIATED IN THE LOWER POLE OF THE LEFT LOBE OF THYROID.  3. SMALLER (6 AND 8 MM IN SIZE) NODULES WITHIN THE MID RIGHT LOBE OF THE THYROID.  L Thyroid nodule FNA (05/10/2003): The morphologic features are most consistent with a colloid nodule.   Thyroid U/S at [facility] (05/29/2014):  Several R nodules:1.4 cm (heterogeneous) and 1.1 cm (hypoechoic), vascular, nodules and 3 subcm nodules 3 L nodules: 2.1 cm (hypervascular), the rest smaller: 1.5 and 0.7 cm hypoechoic nodules.  Thyroid U/S (08/07/2016): Parenchymal Echotexture: Mildly heterogenous Isthmus: Measures 0.3 cm in the AP dimension. Right lobe: 5.3 x 1.6 x 1.7 cm Left lobe: 5.5 x 1.6 x 2.1 cm _________________________________________________________    Nodule # 1: Location: Right; Mid, pregnancy was increased from a maximum URI Maximum size: 1.3 cm; Other 2 dimensions: 0.8 x 0.9 cm Composition: solid/almost completely solid (2)  Echogenicity: hypoechoic (2) ACR TI-RADS total points: 4. *Given size (>/= 1 - 1.4 cm) and appearance, a follow-up ultrasound in 1 year should be considered based on TI-RADS criteria.   Nodule # 2:  Location: Right; Mid Maximum size: 1.5 cm; Other 2 dimensions: 0.8 x 1.0 cm Composition: solid/almost completely solid (2)  Echogenicity: hypoechoic (2) Echogenic foci: punctate echogenic foci (3) ACR TI-RADS total points: 7.  **Given size (>/= 1.0 cm) and appearance, fine needle aspiration of this highly suspicious nodule should be considered based on TI-RADS criteria.   Multiple additional small nodules throughout the right thyroid lobe. There are 2 cysts in the right inferior thyroid,  largest measuring up to 0.7 cm. ______________________________________________________________   Dominant mildly hypoechoic solid nodule in the mid left thyroid lobe that measures 2.3 x 1.3 x 1.6 cm. By report, this previously measured 3.3 x 1.5 x 2.1 cm. This appears to represent the previously biopsied left thyroid nodule. Few small areas of cystic degeneration within this nodule. There are questionable echogenic foci throughout this nodule.   Nodule # 3: Location: Left; Inferior Maximum size: 1.5 cm; Other 2 dimensions: 0.7 x 1.1 cm. By report, there was a nodule in the inferior left thyroid lobe that measured 1.6 x 0.9 x 1.0 cm. Composition: solid/almost completely solid (2) Echogenicity: hypoechoic (2) Shape: not taller-than-wide (0) Margins: lobulated/irregular (2) Echogenic foci: macrocalcifications (1) ACR TI-RADS total points: 7.  **Given size (>/= 1.0 cm) and appearance, fine needle aspiration of this highly suspicious nodule should be considered based on TI-RADS criteria.   IMPRESSION: Multiple thyroid nodules.   The dominant nodule in left thyroid lobe measures up to 2.3 cm and appears to represent the previously biopsied nodule.   Nodule in the inferior left thyroid lobe (Nodule #3) and a nodule in the right mid thyroid lobe (Nodule #2) both meet criteria for biopsy.   Nodule #1 in the mid right thyroid lobe meets criteria for 1 year follow-up.    FNA of the dominant nodules (09/10/2016): Adequacy Reason UNSATISFACTORY for evaluation due to extremely scant cellularity. The specimen is processed and examined microscopically, but is found to be unsatisfactory for evaluation of an epithelial abnormality. Repeat study recommended. Diagnosis THYROID, FINE NEEDLE ASPIRATION LEFT LOWER POLE (SPECIMEN 1 OF 2 COLLECTED 09/10/2016) BLOOD. NO FOLLICULAR CELLULARITY PRESENT. Jimmy Picket MD Pathologist, Electronic Signature (Case signed 09/11/2016) Specimen Clinical  Information Previous Biopsy today and 05/11/2003), Nodule 3: Left Inferior 1.5 cm; Other 2 dimensions: 0.7 x 1.1 cm., By report there was nodule in the inferior left thyroid lobe that measured 1.6 x 0.9 x 1.0 cm., Hypoechoic, ACR TI- RADS total points:7, Highly suspicious nodule  Adequacy Reason Satisfactory For Evaluation. Diagnosis THYROID, FINE NEEDLE ASPIRATION, RMP (SPECIMEN 2 OF 2, COLLECTED 09/10/16): CONSISTENT WITH BENIGN FOLLICULAR NODULE (BETHESDA CATEGORY II). Jimmy Picket MD Pathologist, Electronic Signature (Case signed 09/11/2016) Specimen Clinical Information Today and11/05/2003, Nodule 2: Right Mid 1.5 cm; Other 2 dimensions: 0.8 x 1.0cm solid /almost completely solid , Hypoechoic, ACR TI - RADS total points: 7, Highly suspicious nodule Source Thyroid, Fine Needle Aspiration, Rt Lobe RMP, (Specimen 2of 2, collected on 09/10/16 )  Thyroid uptake and scan (10/25/2020): Low uptake in left hot nodule versus toxic multinodular goiter: Multiple bilateral nodules. Gland normal size. One warm nodule in the upper pole of the LEFT lobe. 4 hour I-123 uptake = 4.4% (normal 5-20%) 24 hour I-123 uptake = 13.0% (normal 10-30%)   IMPRESSION: 1. Multinodular gland. 2. Low I 131 uptake. 3. With depressed TSH, findings most suggestive of toxic multinodular goiter versus versus autonomous nodule in the upper pole LEFT thyroid gland.  She opted not to go ahead with RAI treatment.  Pt denies: - feeling  nodules in neck - hoarseness - dysphagia - choking  No FH of thyroid disease or of thyroid cancer. No h/o radiation tx to head or neck. No herbal supplements. No Biotin use. No recent steroids use.   She also has a history of HTN, GERD, DM2.  She has mild prediabetes.  Review latest HbA1c levels: Lab Results  Component Value Date   HGBA1C 6.4 05/22/2023   HGBA1C 5.6 01/16/2023   HGBA1C 5.8 12/20/2021   HGBA1C 5.7 08/16/2021   HGBA1C 5.8 03/26/2021   HGBA1C 5.8 (A) 07/31/2020    HGBA1C 6.2 02/14/2020   HGBA1C 5.9 04/26/2019   HGBA1C 6.4 11/17/2018   HGBA1C 6.1 04/06/2018  Was previously on semaglutide.  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  ROS: + see HPI  I reviewed pt's medications, allergies, PMH, social hx, family hx:  Past Medical History:  Diagnosis Date   Allergy    Arthritis    GERD (gastroesophageal reflux disease)    Heart murmur    Heart murmur 02/01/2013   History of osteoarthritis    left knee   Hypertension    under control with med., has been on med. x 5 yr.   Immature cataract 2021   removed bilaterally   KNEE PAIN, LEFT, CHRONIC 07/30/2009   Seen by [facility] 03/31/12,DJD,  steroid injection    Medial meniscus tear 09/2013   right knee   Non-insulin dependent type 2 diabetes mellitus (HCC)    off metformin- diet controlled per pt    Pelvic peritoneal adhesions, female 04/21/2013   Recurrent tonsillitis 12/01/2016   Sinus headache 10/30/2015   Vertigo 02/07/2011   Wears partial dentures    upper   Past Surgical History:  Procedure Laterality Date   CATARACT EXTRACTION Right 01/2020   CATARACT EXTRACTION Left 11/2019   COLONOSCOPY  2009   normal    CORNEAL TRANSPLANT Right 2022   CYST EXCISION  03/25/2007   resection peritoneal inclusion cyst   HERNIA REPAIR     JOINT REPLACEMENT Left    knee   KNEE ARTHROSCOPY Left 2011   KNEE ARTHROSCOPY Right 10/21/2013   Procedure: ARTHROSCOPY RIGHT KNEE;  Surgeon: Harvie Junior, MD;  Location: [facility];  Service: Orthopedics;  Laterality: Right;  medial , lateral and patella femoral chondromalsia and medial plica   LAPAROSCOPY N/A 04/21/2013   Procedure: LAPAROSCOPY DIAGNOSTIC;  Surgeon: Willodean Rosenthal, MD;  Location: [facility];  Service: Gynecology;  Laterality: N/A;   LAPAROTOMY N/A 04/21/2013   Procedure: LAPAROTOMY;  Surgeon: Willodean Rosenthal, MD;  Location: [facility];  Service: Gynecology;  Laterality: N/A;   LYSIS OF  ADHESION  08/28/2005; 03/25/2007; 04/21/2013   POLYPECTOMY     SALPINGOOPHORECTOMY Left 04/21/2013   Procedure: SALPINGO OOPHORECTOMY;  Surgeon: Willodean Rosenthal, MD;  Location: [facility];  Service: Gynecology;  Laterality: Left;   TOTAL ABDOMINAL HYSTERECTOMY  08/28/2005   TOTAL KNEE ARTHROPLASTY Left 02/11/2013   Procedure: LEFT TOTAL KNEE ARTHROPLASTY;  Surgeon: Harvie Junior, MD;  Location: [facility];  Service: Orthopedics;  Laterality: Left;  NO COMPUTER   UMBILICAL HERNIA REPAIR  1984   UNILATERAL SALPINGECTOMY Right 08/28/2005   History   Social History   Marital Status: Married    Spouse Name: N/A    Number of Children: 4   Occupational History    Nutritional therapist    Social History Main Topics   Smoking status: Never Smoker    Smokeless tobacco: Never  Used   Alcohol Use: No   Drug Use: No   Sexual Activity: Yes    Birth Control/ Protection: Surgical   Social History Narrative   Lives with husband and 79 year old son.     Current Outpatient Medications on File Prior to Visit  Medication Sig Dispense Refill   acetaminophen (TYLENOL) 500 MG tablet Take 500 mg by mouth every 6 (six) hours as needed.     Azelastine-Fluticasone 137-50 MCG/ACT SUSP PLACE 1 DOSE INTO THE NOSE 2 (TWO) TIMES DAILY. (Patient not taking: Reported on 07/23/2023) 23 g 1   cetirizine (ZYRTEC) 10 MG tablet Take 10 mg by mouth daily.     diclofenac sodium (VOLTAREN) 1 % GEL Apply 4 g to your back four times daily as needed for pain 100 g 0   esomeprazole (NEXIUM) 20 MG capsule Take 1 capsule (20 mg total) by mouth daily. (Patient taking differently: Take 20 mg by mouth as needed.) 90 capsule 1   fluticasone (FLONASE) 50 MCG/ACT nasal spray Place 2 sprays into both nostrils daily.     guaiFENesin (MUCINEX) 600 MG 12 hr tablet Take 600 mg by mouth 2 (two) times daily as needed.     ibuprofen (ADVIL) 400 MG tablet Take 1 tablet (400 mg total) by mouth every 8 (eight) hours as needed. (Patient not taking:  Reported on 07/23/2023) 60 tablet 1   lisinopril (ZESTRIL) 5 MG tablet Take 1 tablet (5 mg total) by mouth daily. 90 tablet 1   lubiprostone (AMITIZA) 8 MCG capsule Take 1 capsule (8 mcg total) by mouth 2 (two) times daily with a meal. 60 capsule 5   meclizine (ANTIVERT) 25 MG tablet TAKE 1 TABLET (25 MG TOTAL) BY MOUTH 2 (TWO) TIMES DAILY AS NEEDED (VERTIGO). 30 tablet 0   meloxicam (MOBIC) 15 MG tablet Take 15 mg by mouth.     Multiple Vitamin (MULTIVITAMIN) tablet Take 1 tablet by mouth daily. Womens Ultra Mega Vitamin     prednisoLONE acetate (PRED FORTE) 1 % ophthalmic suspension Place 1 drop into both eyes daily.     Semaglutide-Weight Management 0.25 MG/0.5ML SOAJ Inject 0.25 mg into the skin once a week. (Patient not taking: Reported on 07/23/2023) 2 mL 0   Zinc 50 MG TABS Take 50 mg by mouth daily. (Patient not taking: Reported on 07/23/2023)     No current facility-administered medications on file prior to visit.   Allergies  Allergen Reactions   Iohexol Hives         Ivp Dye [Iodinated Contrast Media] Hives    Denies airway involvement   Other Rash    COBAN   Family History  Problem Relation Age of Onset   Diabetes Mother    Hypertension Mother    Hyperlipidemia Mother    Colon polyps Mother    Emphysema Father    Breast cancer Cousin 72   Colon polyps Sister    Esophageal cancer Brother    Colon cancer Neg Hx    Rectal cancer Neg Hx    Stomach cancer Neg Hx    PE: BP 138/80   Pulse 70   Ht 5\' 6"  (1.676 m)   Wt 236 lb 12.8 oz (107.4 kg)   SpO2 99%   BMI 38.22 kg/m    Wt Readings from Last 15 Encounters:  10/07/23 236 lb 12.8 oz (107.4 kg)  07/23/23 242 lb 2 oz (109.8 kg)  05/22/23 232 lb 6.4 oz (105.4 kg)  04/22/23 224 lb (101.6 kg)  03/20/23 212 lb (96.2 kg)  01/16/23 197 lb 6.4 oz (89.5 kg)  10/10/22 180 lb (81.6 kg)  10/07/22 183 lb 6.4 oz (83.2 kg)  06/27/22 178 lb 6 oz (80.9 kg)  03/21/22 194 lb 3.2 oz (88.1 kg)  02/18/22 197 lb 3.2 oz (89.4 kg)   12/20/21 206 lb 9.6 oz (93.7 kg)  10/09/21 216 lb 9.6 oz (98.2 kg)  08/16/21 215 lb 6 oz (97.7 kg)  05/14/21 216 lb 2 oz (98 kg)   Constitutional: overweight, in NAD Eyes: EOMI, no exophthalmos ENT: + very mild thyroid fullness, no cervical lymphadenopathy Cardiovascular: RRR, No MRG Respiratory: CTA B Musculoskeletal: no deformities Skin: no rashes Neurological: no tremor with outstretched hands  ASSESSMENT: 1.  Subclinical thyrotoxicosis  2.  Multinodular goiter - 2004 FNA of the left dominant nodule: benign - 2018 FNA of them left lung dominant nodule: Benign - 2018 FNA of the right dominant nodule: Inconclusive due to unsatisfactory sample  PLAN:  1. 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  His antibodies were undetectable.  Thyroid uptake and scan performed in 09/2020 showed low uptake with a warm left nodule versus toxic multinodular goiter.  I ordered the RAI treatment but she did not proceed with this.  She did not want to proceed with thyroidectomy, either.  At later visit, we discussed about methimazole treatment but I again recommended RAI ablation as definitive treatment.  She was reticent to go ahead with this due to lack of symptoms from the mild thyrotoxicosis.  Therefore, since her thyrotoxicosis was mild, we decided to just continue to follow the tests, unless they started worsening. - We checked her TFTs at last visit and the TSH was slightly low, with normal free thyroid hormones - At last visit she had no signs or symptoms of thyrotoxicosis except for hot flashes possibly related to menopause and also hair loss. - Will recheck her TFTs now and may need methimazole depending on these results. She agrees with this. -I will see her back in 1 year  2. Multinodular goiter -Patient with long history of thyroid nodule, with a thyroid scan in 2004 that  showed 3 cold left thyroid nodules.  She had an FNA of the dominant nodule, which at that time measured 3.3 cm and was benign.  The nodule subsequently decreased in size and on the ultrasound from 2019, it measured 2.3 cm in the largest dimension.  On this ultrasound, she had another, smaller, nodule, in the left lobe, which was hypoechoic and had microcalcifications.  Biopsy of this nodule in 2018 was benign.  In the right lobe, the 2019 ultrasound showed 2 nodules: 1 was hypoechoic and low risk so the recommendation was to just follow-up without intervention, but 1 nodule was hypoechoic, with microcalcifications and the recommendation was for biopsy.We biopsied this nodule in 2018 but the biopsy was inconclusive due to scant material.  At that time, I suggested repeat biopsy in 6 months but she was lost for follow-up.  She returned after 3 years and we decided to proceed with another thyroid ultrasound after the results of the thyroid uptake and scan were back.  Since the nodule is more warm/hot on the scan, I did not suggest any biopsy at that time since overproducing thyroid nodules are usually benign -She has no neck compression symptoms and I do not feel masses on palpation of her neck today.  She does have very mild  thyroid fullness, which is not new -We will repeat another thyroid ultrasound now  Orders Placed This Encounter  Procedures   US THYROID   TSH   T4, free   T3, free   Carlus Pavlov, MD PhD [facility]

## 2023-10-08 ENCOUNTER — Encounter: Payer: Self-pay | Admitting: Internal Medicine

## 2023-10-08 LAB — T3, FREE: T3, Free: 3.7 pg/mL (ref 2.3–4.2)

## 2023-10-08 LAB — T4, FREE: Free T4: 1.1 ng/dL (ref 0.8–1.8)

## 2023-10-08 LAB — TSH: TSH: 0.02 m[IU]/L — ABNORMAL LOW (ref 0.40–4.50)

## 2023-10-08 MED ORDER — METHIMAZOLE 5 MG PO TABS: 5.0000 mg | ORAL_TABLET | Freq: Three times a day (TID) | ORAL | 3 refills | Status: DC

## 2023-10-08 NOTE — Addendum Note (Signed)
 Addended by: Carlus Pavlov on: 10/08/2023 04:53 PM   Modules accepted: Orders

## 2023-10-13 ENCOUNTER — Ambulatory Visit
Admission: RE | Admit: 2023-10-13 | Discharge: 2023-10-13 | Disposition: A | Discharge status: Home / Self Care | Source: Ambulatory Visit | Attending: Internal Medicine | Admitting: Internal Medicine

## 2023-10-13 DIAGNOSIS — E042 Nontoxic multinodular goiter: Secondary | ICD-10-CM

## 2023-10-20 ENCOUNTER — Encounter: Payer: Self-pay | Admitting: Internal Medicine

## 2023-11-09 ENCOUNTER — Other Ambulatory Visit: Payer: Self-pay | Admitting: Family Medicine

## 2023-11-17 ENCOUNTER — Ambulatory Visit

## 2023-12-11 ENCOUNTER — Encounter: Payer: Self-pay | Admitting: Family Medicine

## 2023-12-11 ENCOUNTER — Ambulatory Visit (INDEPENDENT_AMBULATORY_CARE_PROVIDER_SITE_OTHER): Admitting: Family Medicine

## 2023-12-11 VITALS — BP 155/81 | HR 63 | Ht 66.0 in | Wt 242.2 lb

## 2023-12-11 DIAGNOSIS — E119 Type 2 diabetes mellitus without complications: Secondary | ICD-10-CM | POA: Diagnosis not present

## 2023-12-11 DIAGNOSIS — I10 Essential (primary) hypertension: Secondary | ICD-10-CM | POA: Diagnosis not present

## 2023-12-11 DIAGNOSIS — E785 Hyperlipidemia, unspecified: Secondary | ICD-10-CM | POA: Diagnosis not present

## 2023-12-11 DIAGNOSIS — Z23 Encounter for immunization: Secondary | ICD-10-CM

## 2023-12-11 LAB — POCT GLYCOSYLATED HEMOGLOBIN (HGB A1C): HbA1c, POC (controlled diabetic range): 6.2 % (ref 0.0–7.0)

## 2023-12-11 NOTE — Assessment & Plan Note (Addendum)
 Diet control FLP checked today

## 2023-12-11 NOTE — Progress Notes (Signed)
    SUBJECTIVE:   CHIEF COMPLAINT / HPI:   Hx of DM2: Here for follow-up.  HTN: Compliant with Lisinopril  5 mg QD. Does not check BP at home. No other concerns related to her BP today.  HLD: Off meds. She is here for follow-up.  Feet swelling: On and off, ongoing for a while. No pain  PERTINENT  PMH / PSH: PMHx reviewed  OBJECTIVE:   BP (!) 144/84   Pulse 63   Ht 5' 6 (1.676 m)   Wt 242 lb 3.2 oz (109.9 kg)   SpO2 100%   BMI 39.09 kg/m   Physical Exam Vitals and nursing note reviewed.   Cardiovascular:     Rate and Rhythm: Normal rate and regular rhythm.     Heart sounds: Normal heart sounds. No murmur heard. Pulmonary:     Effort: Pulmonary effort is normal. No respiratory distress.     Breath sounds: Normal breath sounds. No wheezing.  Abdominal:     General: There is no distension.     Palpations: Abdomen is soft. There is no mass.     Tenderness: There is no abdominal tenderness.   Musculoskeletal:     Right lower leg: No edema.     Left lower leg: No edema.     Comments: Sensory exam of the foot is normal, tested with the monofilament. Good pulses, no lesions or ulcers, good peripheral pulses.       ASSESSMENT/PLAN:   Assessment & Plan Type 2 diabetes mellitus without complication, without long-term current use of insulin  (HCC) A1C now in the prediabetic range Monitor closely of meds Bmet checked Unable to give urine for uACR - will return for testing Encounter for immunization PCV20 and tdap given HYPERTENSION, BENIGN SYSTEMIC Poorly controlled on Lisinopril  5 mg Recommend home BP monitoring and check Return in 2 weeks for reassessment Consider medication dose adjustment if no improvement She agreed with the plan Hyperlipidemia, unspecified hyperlipidemia type Diet control FLP checked today     Penni Bowman, MD [facility]

## 2023-12-11 NOTE — Patient Instructions (Signed)
Pneumococcal Conjugate Vaccine: What You Need to Know Many vaccine information statements are available in Spanish and other languages. See PromoAge.com.br. 1. Why get vaccinated? Pneumococcal conjugate vaccine can prevent pneumococcal disease. Pneumococcal disease refers to any illness caused by pneumococcal bacteria. These bacteria can cause many types of illnesses, including pneumonia, which is an infection of the lungs. Pneumococcal bacteria are one of the most common causes of pneumonia. Besides pneumonia, pneumococcal bacteria can also cause: Ear infections Sinus infections Meningitis (infection of the tissue covering the brain and spinal cord) Bacteremia (infection of the blood) Anyone can get pneumococcal disease, but children under 13 years old, people with certain medical conditions or other risk factors, and adults 65 years or older are at the highest risk. Most pneumococcal infections are mild. However, some can result in long-term problems, such as brain damage or hearing loss. Meningitis, bacteremia, and pneumonia caused by pneumococcal disease can be fatal. 2. Pneumococcal conjugate vaccine Pneumococcal conjugate vaccine helps protect against bacteria that cause pneumococcal disease. There are three pneumococcal conjugate vaccines (PCV13, PCV15, and PCV20). The different vaccines are recommended for different people based on age and medical status. Your health care provider can help you determine which type of pneumococcal conjugate vaccine, and how many doses, you should receive. Infants and young children usually need 4 doses of pneumococcal conjugate vaccine. These doses are recommended at 2, 4, 6, and 58-29 months of age. Older children and adolescents might need pneumococcal conjugate vaccine depending on their age and medical conditions or other risk factors if they did not receive the recommended doses as infants or young children. Adults 19 through 11 years old with certain  medical conditions or other risk factors who have not already received pneumococcal conjugate vaccine should receive pneumococcal conjugate vaccine. Adults 65 years or older who have not previously received pneumococcal conjugate vaccine should receive pneumococcal conjugate vaccine. Some people with certain medical conditions are also recommended to receive pneumococcal polysaccharide vaccine (a different type of pneumococcal vaccine known as PPSV23). Some adults who have previously received a pneumococcal conjugate vaccine may be recommended to receive another pneumococcal conjugate vaccine. 3. Talk with your health care provider Tell your vaccination provider if the person getting the vaccine: Has had an allergic reaction after a previous dose of any type of pneumococcal conjugate vaccine (PCV13, PCV15, PCV20, or an earlier pneumococcal conjugate vaccine known as PCV7), or to any vaccine containing diphtheria toxoid (for example, DTaP), or has any severe, life-threatening allergies In some cases, your health care provider may decide to postpone pneumococcal conjugate vaccination until a future visit. People with minor illnesses, such as a cold, may be vaccinated. People who are moderately or severely ill should usually wait until they recover. Your health care provider can give you more information. 4. Risks of a vaccine reaction Redness, swelling, pain, or tenderness where the shot is given, and fever, loss of appetite, fussiness (irritability), feeling tired, headache, muscle aches, joint pain, and chills can happen after pneumococcal conjugate vaccination. Young children may be at increased risk for seizures caused by fever after a pneumococcal conjugate vaccine if it is administered at the same time as inactivated influenza vaccine. Ask your health care provider for more information. People sometimes faint after medical procedures, including vaccination. Tell your provider if you feel dizzy or  have vision changes or ringing in the ears. As with any medicine, there is a very remote chance of a vaccine causing a severe allergic reaction, other serious injury, or death. 5.  What if there is a serious problem? An allergic reaction could occur after the vaccinated person leaves the clinic. If you see signs of a severe allergic reaction (hives, swelling of the face and throat, difficulty breathing, a fast heartbeat, dizziness, or weakness), call 9-1-1 and get the person to the nearest hospital. For other signs that concern you, call your health care provider. Adverse reactions should be reported to the Vaccine Adverse Event Reporting System (VAERS). Your health care provider will usually file this report, or you can do it yourself. Visit the VAERS website at www.vaers.LAgents.no or call 913-439-3438. VAERS is only for reporting reactions, and VAERS staff members do not give medical advice. 6. The National Vaccine Injury Compensation Program The [employer] Vaccine Injury Compensation Program (VICP) is a federal program that was created to compensate people who may have been injured by certain vaccines. Claims regarding alleged injury or death due to vaccination have a time limit for filing, which may be as short as two years. Visit the VICP website at SpiritualWord.at or call 860 253 0829 to learn about the program and about filing a claim. 7. How can I learn more? Ask your health care provider. Call your local or state health department. Visit the website of the Food and Drug Administration (FDA) for vaccine package inserts and additional information at FinderList.no. Contact the Centers for Disease Control and Prevention (CDC): Call 4055360835 (1-800-CDC-INFO) or Visit CDC's website at PicCapture.uy. Source: CDC Vaccine Information Statement (Interim) Pneumococcal Conjugate Vaccine (11/08/2021) This same material is available at  FootballExhibition.com.br for no charge. This information is not intended to replace advice given to you by your health care provider. Make sure you discuss any questions you have with your health care provider. Document Revised: 10/01/2022 Document Reviewed: 07/07/2022 Elsevier Patient Education  2024 [employer].

## 2023-12-11 NOTE — Assessment & Plan Note (Addendum)
 Poorly controlled on Lisinopril  5 mg Recommend home BP monitoring and check Return in 2 weeks for reassessment Consider medication dose adjustment if no improvement She agreed with the plan

## 2023-12-14 ENCOUNTER — Other Ambulatory Visit

## 2023-12-14 ENCOUNTER — Other Ambulatory Visit: Payer: Self-pay | Admitting: Family Medicine

## 2023-12-14 DIAGNOSIS — I159 Secondary hypertension, unspecified: Secondary | ICD-10-CM

## 2023-12-14 DIAGNOSIS — E119 Type 2 diabetes mellitus without complications: Secondary | ICD-10-CM | POA: Diagnosis not present

## 2023-12-15 ENCOUNTER — Ambulatory Visit: Payer: Self-pay | Admitting: Family Medicine

## 2023-12-15 DIAGNOSIS — E785 Hyperlipidemia, unspecified: Secondary | ICD-10-CM

## 2023-12-15 LAB — LIPID PANEL
Chol/HDL Ratio: 3.3 ratio (ref 0.0–4.4)
Cholesterol, Total: 164 mg/dL (ref 100–199)
HDL: 49 mg/dL (ref >39)
LDL Chol Calc (NIH): 94 mg/dL (ref 0–99)
Triglycerides: 120 mg/dL (ref 0–149)
VLDL Cholesterol Cal: 21 mg/dL (ref 5–40)

## 2023-12-15 LAB — BASIC METABOLIC PANEL WITH GFR
BUN/Creatinine Ratio: 16 (ref 12–28)
BUN: 15 mg/dL (ref 8–27)
CO2: 23 mmol/L (ref 20–29)
Calcium: 8.9 mg/dL (ref 8.7–10.3)
Chloride: 106 mmol/L (ref 96–106)
Creatinine, Ser: 0.95 mg/dL (ref 0.57–1.00)
Glucose: 123 mg/dL — ABNORMAL HIGH (ref 70–99)
Potassium: 3.8 mmol/L (ref 3.5–5.2)
Sodium: 143 mmol/L (ref 134–144)
eGFR: 69 mL/min/{1.73_m2} (ref >59)

## 2023-12-15 MED ORDER — ROSUVASTATIN CALCIUM 5 MG PO TABS: 5.0000 mg | ORAL_TABLET | Freq: Every day | ORAL | 1 refills | Status: DC

## 2023-12-15 NOTE — Telephone Encounter (Signed)
 Test result discussed. LDL > 70 Goal is < 70, given hx of DM/PreDM, HTN, and age > 80 We will resume Crestor  5 mg QD Recheck FLP in 6-12 Continue lifestyle modification.

## 2023-12-16 ENCOUNTER — Ambulatory Visit: Payer: Self-pay | Admitting: Family Medicine

## 2023-12-16 ENCOUNTER — Other Ambulatory Visit: Payer: Self-pay | Admitting: Family Medicine

## 2023-12-16 LAB — LIPID PANEL

## 2023-12-16 LAB — BASIC METABOLIC PANEL WITH GFR

## 2023-12-16 LAB — MICROALBUMIN / CREATININE URINE RATIO
Creatinine, Urine: 322.4 mg/dL
Microalb/Creat Ratio: 8 mg/g{creat} (ref 0–29)
Microalbumin, Urine: 26.8 ug/mL

## 2023-12-24 ENCOUNTER — Telehealth: Payer: Self-pay | Admitting: Family Medicine

## 2023-12-24 ENCOUNTER — Other Ambulatory Visit: Payer: Self-pay | Admitting: Family Medicine

## 2023-12-24 NOTE — Telephone Encounter (Signed)
 HIPAA compliant callback message left.   Note I received a refill request for her Lisinopril  5 mg today. She sees me tomorrow for BP management. I want to check if she has enough medication to last her till tomorrow before refills in case, we need to adjust her dose tomorrow. Will hold off on approving her refill for now.   Dr. Anders

## 2023-12-24 NOTE — Telephone Encounter (Signed)
 Patient returns call to nurse line.   Patient advised of PCP recommendations.   She reports she will see her tomorrow.

## 2023-12-25 ENCOUNTER — Encounter: Payer: Self-pay | Admitting: Family Medicine

## 2023-12-25 ENCOUNTER — Ambulatory Visit (INDEPENDENT_AMBULATORY_CARE_PROVIDER_SITE_OTHER): Payer: Self-pay | Admitting: Family Medicine

## 2023-12-25 VITALS — BP 146/91 | HR 67 | Ht 66.0 in | Wt 243.2 lb

## 2023-12-25 DIAGNOSIS — I10 Essential (primary) hypertension: Secondary | ICD-10-CM

## 2023-12-25 DIAGNOSIS — F439 Reaction to severe stress, unspecified: Secondary | ICD-10-CM

## 2023-12-25 DIAGNOSIS — Z6839 Body mass index (BMI) 39.0-39.9, adult: Secondary | ICD-10-CM

## 2023-12-25 MED ORDER — PHENTERMINE HCL 15 MG PO CAPS: 15.0000 mg | ORAL_CAPSULE | ORAL | 1 refills | Status: DC

## 2023-12-25 MED ORDER — LISINOPRIL 10 MG PO TABS: 10.0000 mg | ORAL_TABLET | Freq: Every day | ORAL | Status: DC

## 2023-12-25 NOTE — Assessment & Plan Note (Addendum)
 Poorly controlled Increase Lisinopril  to 10 mg every day F/U in 2-4 weeks for reassessment She agreed with the plan.

## 2023-12-25 NOTE — Patient Instructions (Addendum)
 It was nice seeing you today. We increased your Lisinopril  to 10 mg daily. You can double up on your 5 mg tablets. We also started your on Phentermine  for weight loss. Please see me in 4 weeks for reassessment.

## 2023-12-25 NOTE — Assessment & Plan Note (Addendum)
 Compliant with diet and exercise regimen Will trial Phentermine  She is familiar with this medication Increase dose as tolerated

## 2023-12-25 NOTE — Progress Notes (Signed)
    SUBJECTIVE:   CHIEF COMPLAINT / HPI:   HTN: She is compliant with Lisinopril  5 mg QD  Weight Management: The patient is compliant with the exercise and diet regimen and is frustrated as she is unable to lose weight despite her efforts. She had done well on Phentermine  in the past and would like to get back on this medication.  Stress: She is experiencing stress caring for her mother and her husband. She destresses mostly by going to the Gym to exercise. This might have contributed to her poorly controlled BP.  PERTINENT  PMH / PSH: PMHx reviewed  OBJECTIVE:   BP (!) 146/91   Pulse 67   Ht 5' 6 (1.676 m)   Wt 243 lb 3.2 oz (110.3 kg)   SpO2 97%   BMI 39.25 kg/m   Physical Exam Vitals and nursing note reviewed.   Cardiovascular:     Rate and Rhythm: Normal rate and regular rhythm.     Heart sounds: Normal heart sounds. No murmur heard. Pulmonary:     Effort: Pulmonary effort is normal. No respiratory distress.     Breath sounds: Normal breath sounds. No wheezing.      ASSESSMENT/PLAN:   Assessment & Plan HYPERTENSION, BENIGN SYSTEMIC Poorly controlled Increase Lisinopril  to 10 mg every day F/U in 2-4 weeks for reassessment She agreed with the plan. BMI 39.0-39.9,adult Compliant with diet and exercise regimen Will trial Phentermine  She is familiar with this medication Increase dose as tolerated Stress No signs of depression No SI or HI Seems to be coping well Monitor closely for now     Otto Fairly, MD [facility]

## 2023-12-28 ENCOUNTER — Encounter: Payer: Self-pay | Admitting: Family Medicine

## 2024-01-21 ENCOUNTER — Ambulatory Visit: Payer: Self-pay | Admitting: Family Medicine

## 2024-01-21 ENCOUNTER — Encounter: Payer: Self-pay | Admitting: Family Medicine

## 2024-01-21 VITALS — BP 183/96 | HR 66 | Ht 66.0 in | Wt 245.4 lb

## 2024-01-21 DIAGNOSIS — R252 Cramp and spasm: Secondary | ICD-10-CM

## 2024-01-21 DIAGNOSIS — R609 Edema, unspecified: Secondary | ICD-10-CM

## 2024-01-21 DIAGNOSIS — Z6839 Body mass index (BMI) 39.0-39.9, adult: Secondary | ICD-10-CM

## 2024-01-21 DIAGNOSIS — I10 Essential (primary) hypertension: Secondary | ICD-10-CM

## 2024-01-21 MED ORDER — PHENTERMINE HCL 30 MG PO CAPS: 30.0000 mg | ORAL_CAPSULE | ORAL | 1 refills | Status: DC

## 2024-01-21 MED ORDER — BACLOFEN 10 MG PO TABS: 10.0000 mg | ORAL_TABLET | Freq: Every evening | ORAL | 0 refills | Status: DC | PRN

## 2024-01-21 NOTE — Assessment & Plan Note (Addendum)
 Uncontrolled BP No cardiopulm or neurologic symptoms ?? related to Pherntermine. However, she feels this is related to stress Medication adjustment discussed She prefers not to have additional medication to her Norvasc She will cut back on salt intake and continue lifestyle modification Monitor B closely at home F/U in 4 weeks for reassessment

## 2024-01-21 NOTE — Progress Notes (Addendum)
    SUBJECTIVE:   CHIEF COMPLAINT / HPI:    HTN: Compliant with Lisinopril  10 mg QD. She attributed high BP to stress at home. Eats a lot of salt daily.  Weight management: Compliant with Phentermine  15 mg QD. This has not helped curb her hunger. She feels she has gained weight since her last visit. She exercises regularly and tries to control her diet.  Muscle cramp: C/O muscle cramps at night, which she attributed to exercising. Symptoms improve when she eats salt.   PERTINENT  PMH / PSH: PMHx reviewed  OBJECTIVE:   BP (!) 183/96   Pulse 66   Ht 5' 6 (1.676 m)   Wt 245 lb 6 oz (111.3 kg)   SpO2 100%   BMI 39.60 kg/m   Physical Exam Vitals and nursing note reviewed.  Cardiovascular:     Rate and Rhythm: Normal rate and regular rhythm.     Heart sounds: Normal heart sounds. No murmur heard. Pulmonary:     Effort: Pulmonary effort is normal. No respiratory distress.     Breath sounds: Normal breath sounds. No wheezing.  Musculoskeletal:     Comments: Trace B/L ankle edema and a few inches above the ankle B/L No LL swelling or erythema      ASSESSMENT/PLAN:   Assessment & Plan HYPERTENSION, BENIGN SYSTEMIC Uncontrolled BP No cardiopulm or neurologic symptoms ?? related to Pherntermine. However, she feels this is related to stress Medication adjustment discussed She prefers not to have additional medication to her Norvasc She will cut back on salt intake and continue lifestyle modification Monitor B closely at home F/U in 4 weeks for reassessment BMI 39.0-39.9,adult Gained 2 lbs on Phentermine  15 mg every day Increased to 30 mg F/U in 4 weeks for reassessment Cramps, extremity ?? Due to excessive exercise vs dehydration Normal C, Na and K+ level Consider Magnesium  check in the future Reduce salt intake Baclofen  escribed prn muscle cramps Trace edema ?? Related to salt intake Reduce salt intake as discussed Monitor for improvement     Otto Fairly, MD [facility]

## 2024-01-21 NOTE — Assessment & Plan Note (Addendum)
??   Due to excessive exercise vs dehydration Normal C, Na and K+ level Consider Magnesium  check in the future Reduce salt intake Baclofen  escribed prn muscle cramps

## 2024-01-21 NOTE — Assessment & Plan Note (Addendum)
 Gained 2 lbs on Phentermine  15 mg every day Increased to 30 mg F/U in 4 weeks for reassessment

## 2024-01-21 NOTE — Patient Instructions (Signed)
Phentermine Capsules or Tablets What is this medication? PHENTERMINE (FEN ter meen) promotes weight loss. It works by decreasing appetite. It is often used for a short period of time. Changes to diet and exercise are often combined with this medication. This medicine may be used for other purposes; ask your health care provider or pharmacist if you have questions. COMMON BRAND NAME(S): Adipex-P, Atti-Plex P, Atti-Plex P Spansule, Fastin, Lomaira, Pro-Fast, Pro-Fast HS, Pro-Fast SA, Tara-8 What should I tell my care team before I take this medication? They need to know if you have any of these conditions: Agitation or nervousness Diabetes Glaucoma Heart disease High blood pressure History of substance use disorder History of stroke Kidney disease Lung disease called Primary Pulmonary Hypertension (PPH) Taken an MAOI, such as Carbex, Eldepryl, Marplan, Nardil, or Parnate in last 14 days Taking stimulant medications for attention disorders, weight loss, or to stay awake Thyroid disease An unusual or allergic reaction to phentermine, other medications, foods, dyes, or preservatives Pregnant or trying to get pregnant Breastfeeding How should I use this medication? Take this medication by mouth with a glass of water. Follow the directions on the prescription label. Take your medication at regular intervals. Do not take it more often than directed. Do not stop taking except on your care team's advice. Talk to your care team about the use of this medication in children. While this medication may be prescribed for children 17 years or older for selected conditions, precautions do apply. Overdosage: If you think you have taken too much of this medicine contact a poison control center or emergency room at once. NOTE: This medicine is only for you. Do not share this medicine with others. What if I miss a dose? If you miss a dose, take it as soon as you can. If it is almost time for your next dose,  take only that dose. Do not take double or extra doses. What may interact with this medication? Do not take this medication with any of the following: MAOIs, such as Carbex, Eldepryl, Marplan, Nardil, and Parnate This medication may also interact with the following: Alcohol Certain medications for depression, anxiety, or other mental health conditions Certain medications for blood pressure Linezolid Medications for colds or breathing difficulties, such as pseudoephedrine or phenylephrine Medications for diabetes Sibutramine Stimulant medications for ADHD, weight loss, or staying awake This list may not describe all possible interactions. Give your health care provider a list of all the medicines, herbs, non-prescription drugs, or dietary supplements you use. Also tell them if you smoke, drink alcohol, or use illegal drugs. Some items may interact with your medicine. What should I watch for while using this medication? Visit your care team for regular checks on your progress. Do not stop taking except on your care team's advice. You may develop a severe reaction. Your care team will tell you how much medication to take. Do not take this medication close to bedtime. It may prevent you from sleeping. This medication may affect your coordination, reaction time, or judgment. Do not drive or operate machinery until you know how this medication affects you. Sit up or stand slowly to reduce the risk of dizzy or fainting spells. Drinking alcohol with this medication can increase the risk of these side effects. This medication may affect blood sugar levels. Ask your care team if changes in diet or medications are needed if you have diabetes. Inform your care team if you wish to become pregnant or think you might be pregnant. Losing   weight while pregnant is not advised and may cause harm to the unborn child. Talk to your care team for more information. What side effects may I notice from receiving this  medication? Side effects that you should report to your care team as soon as possible: Allergic reactions--skin rash, itching, hives, swelling of the face, lips, tongue, or throat Heart valve disease--shortness of breath, chest pain, unusual weakness or fatigue, dizziness, feeling faint or lightheaded, fever, sudden weight gain, fast or irregular heartbeat Pulmonary hypertension--shortness of breath, chest pain, fast or irregular heartbeat, feeling faint or lightheaded, fatigue, swelling of the ankles or feet Side effects that usually do not require medical attention (report to your care team if they continue or are bothersome): Change in taste Diarrhea Dizziness Dry mouth Restlessness Trouble sleeping This list may not describe all possible side effects. Call your doctor for medical advice about side effects. You may report side effects to FDA at 1-800-FDA-1088. Where should I keep my medication? Keep out of the reach of children. This medication can be abused. Keep your medication in a safe place to protect it from theft. Do not share this medication with anyone. Selling or giving away this medication is dangerous and against the law. This medication may cause harm and death if it is taken by other adults, children, or pets. Return medication that has not been used to an official disposal site. Contact the DEA at 1-800-882-9539 or your city/county government to find a site. If you cannot return the medication, mix any unused medication with a substance like cat litter or coffee grounds. Then throw the medication away in a sealed container like a sealed bag or coffee can with a lid. Do not use the medication after the expiration date. Store at room temperature between 20 and 25 degrees C (68 and 77 degrees F). Keep container tightly closed. NOTE: This sheet is a summary. It may not cover all possible information. If you have questions about this medicine, talk to your doctor, pharmacist, or health  care provider.  2024 Elsevier/Gold Standard (2021-12-25 00:00:00)  

## 2024-01-21 NOTE — Assessment & Plan Note (Addendum)
??   Related to salt intake Reduce salt intake as discussed Monitor for improvement

## 2024-02-12 ENCOUNTER — Other Ambulatory Visit: Payer: Self-pay | Admitting: Family Medicine

## 2024-02-16 ENCOUNTER — Telehealth: Payer: Self-pay | Admitting: Family Medicine

## 2024-02-16 ENCOUNTER — Encounter: Payer: Self-pay | Admitting: Family Medicine

## 2024-02-16 VITALS — BP 170/80

## 2024-02-16 DIAGNOSIS — I10 Essential (primary) hypertension: Secondary | ICD-10-CM | POA: Diagnosis not present

## 2024-02-16 DIAGNOSIS — Z6839 Body mass index (BMI) 39.0-39.9, adult: Secondary | ICD-10-CM | POA: Diagnosis not present

## 2024-02-16 DIAGNOSIS — E0590 Thyrotoxicosis, unspecified without thyrotoxic crisis or storm: Secondary | ICD-10-CM | POA: Diagnosis not present

## 2024-02-16 NOTE — Assessment & Plan Note (Signed)
 Phentermine  30 mg daily ineffective for weight loss. Relative contraindicated due to potential increase in blood pressure and heart rate, especially with subclinical hyperthyroidism. - Taper phentermine  to 30 mg every other day. - Prescribe phentermine  15 mg every other day after 30 mg tablets. - Discontinue phentermine  after 15 mg tablets taper is completed. - Discuss alternative weight loss options next appointment.

## 2024-02-16 NOTE — Assessment & Plan Note (Signed)
 Managed with methimazole  5 mg daily. No full-blown symptoms or typical lab findings. Phentermine  use relatively contraindicated. Taper off phentermine  She agreed with the plan - Continue methimazole  5 mg daily.

## 2024-02-16 NOTE — Assessment & Plan Note (Signed)
 Hypertension not well-controlled. Phentermine  may contribute to elevated blood pressure. - Continue lisinopril  10 mg daily. - Check blood pressure twice weekly, send log via MyChart. - Consider increasing lisinopril  to 20 mg if blood pressure remains elevated after tapering off phentermine . - Re-evaluate blood pressure control in September.

## 2024-02-16 NOTE — Progress Notes (Signed)
 [facility] Telemedicine Visit  Patient consented to have virtual visit and was identified by name and date of birth. Method of visit: Video   [employer] AI documentation used with the patient's permission.  Encounter participants: Patient: Cynthia Wilson - located at Home Provider: Otto Fairly - located at [facility] Others (if applicable): N/A  Chief Complaint: HTN/Weight management  HPI:  Cynthia Wilson is a 58 year old female with hypertension and subclinical hypothyroidism who presents for medication review and blood pressure management.  She is currently taking lisinopril  10 mg daily for hypertension, with her last dose taken this morning. She has not checked her blood pressure at home recently, but recalls it being 'one seventy something' over a week ago, indicating it was high at that time. She was previously on a 20 mg dose of lisinopril  many years ago.  She is on methimazole  5 mg for subclinical hypothyroidism.  For weight management, she is taking phentermine  30 mg daily, with the last dose taken yesterday. She reports no noticeable weight loss while on this medication and has about six tablets remaining.  Her current medication regimen also includes Tylenol , Dymista  nasal spray, and baclofen , all taken as needed, and Crestor  5 mg daily. Her last A1c was checked in June and was 6.2. She is due for a recheck in September.  ROS: per HPI  Pertinent PMHx: PMHx reviewed  Exam:  BP (!) 170/80   Physical Exam Vitals reviewed.  Constitutional:      Appearance: Normal appearance. She is not ill-appearing.  Pulmonary:     Effort: Pulmonary effort is normal. No respiratory distress.      Results LABS   A1c: 6.2% (11/2023)  Assessment/Plan:  Hypertension Hypertension not well-controlled. Phentermine  may contribute to elevated blood pressure. - Continue lisinopril  10 mg daily. - Check blood pressure twice weekly, send log via MyChart. -  Consider increasing lisinopril  to 20 mg if blood pressure remains elevated after tapering off phentermine . - Re-evaluate blood pressure control in September.  Obesity Phentermine  30 mg daily ineffective for weight loss. Relative contraindicated due to potential increase in blood pressure and heart rate, especially with subclinical hyperthyroidism. - Taper phentermine  to 30 mg every other day. - Prescribe phentermine  15 mg every other day after 30 mg tablets. - Discontinue phentermine  after 15 mg tablets taper is completed. - Discuss alternative weight loss options next appointment.  Subclinical hyperthyroidism Managed with methimazole  5 mg daily. No full-blown symptoms or typical lab findings. Phentermine  use relatively contraindicated. Taper off phentermine  She agreed with the plan - Continue methimazole  5 mg daily.    Time spent during visit with patient: 10 minutes

## 2024-02-17 MED ORDER — PHENTERMINE HCL 15 MG PO CAPS
ORAL_CAPSULE | ORAL | 0 refills | Status: DC
Start: 2024-02-17 — End: 2024-03-31

## 2024-02-17 NOTE — Addendum Note (Signed)
 Addended by: ANDERS CUMINS T on: 02/17/2024 07:20 AM   Modules accepted: Orders

## 2024-02-19 ENCOUNTER — Other Ambulatory Visit: Payer: Self-pay | Admitting: Family Medicine

## 2024-02-25 ENCOUNTER — Encounter: Payer: Self-pay | Admitting: Family Medicine

## 2024-02-26 ENCOUNTER — Other Ambulatory Visit: Payer: Self-pay | Admitting: Family Medicine

## 2024-02-26 DIAGNOSIS — M47816 Spondylosis without myelopathy or radiculopathy, lumbar region: Secondary | ICD-10-CM

## 2024-02-26 DIAGNOSIS — M179 Osteoarthritis of knee, unspecified: Secondary | ICD-10-CM

## 2024-03-01 ENCOUNTER — Other Ambulatory Visit: Payer: Self-pay | Admitting: Family Medicine

## 2024-03-01 MED ORDER — LISINOPRIL 10 MG PO TABS: 10.0000 mg | ORAL_TABLET | Freq: Every day | ORAL | 0 refills | Status: DC

## 2024-03-04 ENCOUNTER — Encounter: Payer: Self-pay | Admitting: Family Medicine

## 2024-03-09 ENCOUNTER — Encounter: Payer: Self-pay | Admitting: Family Medicine

## 2024-03-11 ENCOUNTER — Ambulatory Visit (INDEPENDENT_AMBULATORY_CARE_PROVIDER_SITE_OTHER): Payer: Self-pay | Admitting: Family Medicine

## 2024-03-11 VITALS — BP 135/80 | HR 74 | Temp 99.0°F | Ht 66.0 in | Wt 249.8 lb

## 2024-03-11 DIAGNOSIS — J069 Acute upper respiratory infection, unspecified: Secondary | ICD-10-CM

## 2024-03-11 DIAGNOSIS — H65191 Other acute nonsuppurative otitis media, right ear: Secondary | ICD-10-CM | POA: Diagnosis not present

## 2024-03-11 MED ORDER — FLUTICASONE PROPIONATE 50 MCG/ACT NA SUSP: 2.0000 | Freq: Every day | NASAL | 2 refills | Status: AC

## 2024-03-11 NOTE — Patient Instructions (Addendum)
General Recommendations:    Please drink plenty of fluids.  Get plenty of rest   Sleep in humidified air  Use saline nasal sprays  Netti pot   OTC Medications:  Decongestants - helps relieve congestion   Flonase (generic fluticasone) or Nasacort (generic triamcinolone) - please make sure to use the "cross-over" technique at a 45 degree angle towards the opposite eye as opposed to straight up the nasal passageway.   Sudafed (generic pseudoephedrine - Note this is the one that is available behind the pharmacy counter); Products with phenylephrine (-PE) may also be used but is often not as effective as pseudoephedrine.   If you have HIGH BLOOD PRESSURE - Coricidin HBP; AVOID any product that is -D as this contains pseudoephedrine which may increase your blood pressure.  Afrin (oxymetazoline) every 6-8 hours for up to 3 days.   Allergies - helps relieve runny nose, itchy eyes and sneezing   Claritin (generic loratidine), Allegra (fexofenidine), or Zyrtec (generic cyrterizine) for runny nose. These medications should not cause drowsiness.  Note - Benadryl (generic diphenhydramine) may be used however may cause drowsiness  Cough -   Delsym or Robitussin (generic dextromethorphan)  Expectorants - helps loosen mucus to ease removal   Mucinex (generic guaifenesin) as directed on the package.  Headaches / General Aches   Tylenol (generic acetaminophen) - DO NOT EXCEED 3 grams (3,000 mg) in a 24 hour time period  Advil/Motrin (generic ibuprofen)   Sore Throat -   Salt water gargle   Chloraseptic (generic benzocaine) spray or lozenges / Sucrets (generic dyclonine)    Sinusitis Sinusitis is redness, soreness, and inflammation of the paranasal sinuses. Paranasal sinuses are air pockets within the bones of your face (beneath the eyes, the middle of the forehead, or above the eyes). In healthy paranasal sinuses, mucus is able to drain out, and air is able to circulate  through them by way of your nose. However, when your paranasal sinuses are inflamed, mucus and air can become trapped. This can allow bacteria and other germs to grow and cause infection. Sinusitis can develop quickly and last only a short time (acute) or continue over a long period (chronic). Sinusitis that lasts for more than 12 weeks is considered chronic.  CAUSES  Causes of sinusitis include:  Allergies.  Structural abnormalities, such as displacement of the cartilage that separates your nostrils (deviated septum), which can decrease the air flow through your nose and sinuses and affect sinus drainage.  Functional abnormalities, such as when the small hairs (cilia) that line your sinuses and help remove mucus do not work properly or are not present. SIGNS AND SYMPTOMS  Symptoms of acute and chronic sinusitis are the same. The primary symptoms are pain and pressure around the affected sinuses. Other symptoms include:  Upper toothache.  Earache.  Headache.  Bad breath.  Decreased sense of smell and taste.  A cough, which worsens when you are lying flat.  Fatigue.  Fever.  Thick drainage from your nose, which often is green and may contain pus (purulent).  Swelling and warmth over the affected sinuses. DIAGNOSIS  Your health care provider will perform a physical exam. During the exam, your health care provider may:  Look in your nose for signs of abnormal growths in your nostrils (nasal polyps).  Tap over the affected sinus to check for signs of infection.  View the inside of your sinuses (endoscopy) using an imaging device that has a light attached (endoscope). If your health care provider   suspects that you have chronic sinusitis, one or more of the following tests may be recommended:  Allergy tests.  Nasal culture. A sample of mucus is taken from your nose, sent to a lab, and screened for bacteria.  Nasal cytology. A sample of mucus is taken from your nose and  examined by your health care provider to determine if your sinusitis is related to an allergy. TREATMENT  Most cases of acute sinusitis are related to a viral infection and will resolve on their own within 10 days. Sometimes medicines are prescribed to help relieve symptoms (pain medicine, decongestants, nasal steroid sprays, or saline sprays).  However, for sinusitis related to a bacterial infection, your health care provider will prescribe antibiotic medicines. These are medicines that will help kill the bacteria causing the infection.  Rarely, sinusitis is caused by a fungal infection. In theses cases, your health care provider will prescribe antifungal medicine. For some cases of chronic sinusitis, surgery is needed. Generally, these are cases in which sinusitis recurs more than 3 times per year, despite other treatments. HOME CARE INSTRUCTIONS   Drink plenty of water. Water helps thin the mucus so your sinuses can drain more easily.  Use a humidifier.  Inhale steam 3 to 4 times a day (for example, sit in the bathroom with the shower running).  Apply a warm, moist washcloth to your face 3 to 4 times a day, or as directed by your health care provider.  Use saline nasal sprays to help moisten and clean your sinuses.  Take medicines only as directed by your health care provider.  If you were prescribed either an antibiotic or antifungal medicine, finish it all even if you start to feel better. SEEK IMMEDIATE MEDICAL CARE IF:  You have increasing pain or severe headaches.  You have nausea, vomiting, or drowsiness.  You have swelling around your face.  You have vision problems.  You have a stiff neck.  You have difficulty breathing. MAKE SURE YOU:   Understand these instructions.  Will watch your condition.  Will get help right away if you are not doing well or get worse. Document Released: 06/16/2005 Document Revised: 10/31/2013 Document Reviewed: 07/01/2011 ExitCare  Patient Information 2015 [employer]. This information is not intended to replace advice given to you by your health care provider. Make sure you discuss any questions you have with your health care provider.   

## 2024-03-11 NOTE — Progress Notes (Signed)
   SUBJECTIVE:   CHIEF COMPLAINT / HPI:  Cynthia Wilson is a 58 y.o. female with a pertinent past medical history of electrolytes, corneal dystrophy s/p bilateral corneal replacements, and prediabetes presenting to the clinic for ear pain and sore throat.  Right ear pain, sore throat Location: Right ear with associated right-sided throat soreness Symptoms began 3 days ago. Pain is: Dull and constant Recent ear trauma: No Prior ear surgeries: No History of diabetes: No  Associated symptoms include: Postnasal drainage, mild headache. Denies Ear discharge, Fever, Ear discharge, Pain with chewing, Ringing in ears, Dizziness, Hearing loss, Rashes or blisters around ear, and Weight loss. Patient notes that a clothes hanger fell in her right eye few days ago, but denies excessive tearing, sensation of foreign body object, inability to open eye, change in vision, blurring of vision.    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   OBJECTIVE:   BP 135/80   Pulse 74   Temp 99 F (37.2 C)   Ht 5' 6 (1.676 m)   Wt 249 lb 12.8 oz (113.3 kg)   SpO2 100%   BMI 40.32 kg/m   General: Age-appropriate, resting comfortably in chair, NAD, alert and at baseline. HEENT:  Head: Normocephalic, atraumatic. No tenderness to percussion over sinuses. Eyes: PERRLA.  Mild erythema of right eye, no scleral injections. Ears: TMs non-bulging and non-erythematous bilaterally. Small R ear effusion. No erythema of external ear canal. No cerumen impaction. Nose: Swollen erythematous turbinates. No rhinorrhea. Mouth/Oral: Mildly erythematous oropharynx, no tonsillar exudate. MMM. Neck: Supple. No LAD. Cardiovascular: Regular rate and rhythm. Normal S1/S2. No murmurs, rubs, or gallops appreciated. 2+ radial  pulses. Pulmonary: Clear bilaterally to ascultation. No wheezes, crackles, or rhonchi. Normal WOB on room air. No accessory muscle use. Abdominal: No tenderness to deep or light palpation. No rebound or guarding. No HSM. Skin: Warm and dry. Extremities: No peripheral edema bilaterally. Capillary refill <2 seconds.   ASSESSMENT/PLAN:   Assessment & Plan Upper respiratory tract infection, unspecified type Acute effusion of right ear Patient is reassuringly well-hydrated on exam and has no respiratory distress. TMs nonbulging and no evidence of acute otitis media favor right ear effusion secondary to congestion. No evidence of pneumonia on lung exam and no fevers. No history of COPD, asthma or other high risk lung condition. Throat is mildly erythematous but no unusual findings and patient is s/p tonsillectomy. - Recommended supportive care, emphasized hydration, steam, and moisturizing nares for decongestion - Refilled fluticasone  spray - Return precautions if fevers present, symptoms not improving in 3-4 days, or unable to tolerate oral intake  Return if symptoms worsen or fail to improve.  Cynthia Vinje Toma, MD [facility]

## 2024-03-14 ENCOUNTER — Ambulatory Visit: Payer: Self-pay | Admitting: Orthopedic Surgery

## 2024-03-21 ENCOUNTER — Ambulatory Visit (INDEPENDENT_AMBULATORY_CARE_PROVIDER_SITE_OTHER): Payer: Self-pay | Admitting: Orthopedic Surgery

## 2024-03-21 ENCOUNTER — Other Ambulatory Visit: Payer: Self-pay

## 2024-03-21 DIAGNOSIS — M654 Radial styloid tenosynovitis [de Quervain]: Secondary | ICD-10-CM | POA: Diagnosis not present

## 2024-03-21 DIAGNOSIS — M25531 Pain in right wrist: Secondary | ICD-10-CM

## 2024-03-21 DIAGNOSIS — M25532 Pain in left wrist: Secondary | ICD-10-CM | POA: Diagnosis not present

## 2024-03-21 DIAGNOSIS — M180 Bilateral primary osteoarthritis of first carpometacarpal joints: Secondary | ICD-10-CM | POA: Diagnosis not present

## 2024-03-21 NOTE — Progress Notes (Signed)
 Cynthia Wilson - 58 y.o. female MRN [id number]  Date of birth: 1965-12-11  Office Visit Note: Visit Date: 03/21/2024 PCP: Cynthia Otto DASEN, MD Referred by: Cynthia Otto DASEN, MD  Subjective: No chief complaint on file.  HPI: Cynthia Wilson is a pleasant 58 y.o. female who presents today for evaluation of bilateral thumb basal joint pain with associated radial sided wrist pain has been present now for greater than 6 months, worsening in nature.  Has trialed extensive nonoperative measures including activity modification, nonsteroidal anti-inflammatories both topical and oral, bracing and previous injections without lasting relief.  States that the pain on the left side is greater than the right.  At this juncture she would like to discuss potential surgical intervention.  She is here today for specific hand surgical consultation.  Pertinent ROS were reviewed with the patient and found to be negative unless otherwise specified above in HPI.   Visit Reason: bilateral thumb basilar joint pain with associated radial sided wrist pain Duration of symptoms: 6+ months Hand dominance: right Occupation: Caregiver Diabetic: No Smoking: No Heart/Lung History: none Blood Thinners: none  Prior Testing/EMG: none Injections (Date): yes; unsure when Treatments: injection Prior Surgery: none  Assessment & Plan: Visit Diagnoses:  1. Primary osteoarthritis of both first carpometacarpal joints   2. Bilateral wrist pain   3. De Quervain's tenosynovitis, bilateral     Plan: Extensive discussion was had with the patient today regarding her ongoing bilateral thumb CMC osteoarthritis with associated de Quervain's tenosynovitis.  X-rays were reviewed in detail today which do show significant degenerative change at the bilateral thumb [facility] articulation.  We discussed treatment modalities ranging from conservative to surgical.  From a conservative standpoint we discussed bracing, injections,  anti-inflammatory medications and activity modification.  From a surgical standpoint we discussed [facility] arthroplasty, risks and benefits as well as the postoperative protocol.  At this juncture, given the severity of symptoms that have remained refractory to conservative care, patient would like to move forward with surgical intervention.  Given the significance of the degenerative change on x-ray which clinically correlates with examination, we can move forward with surgical scheduling of bilateral, staged thumb CMC arthroplasty at the next available date.  She would like to begin with the left side.  Will also perform first extensor compartment release in conjunction with the thumb CMC arthroplasty.  Risks and benefits of the procedure were discussed, risks including but not limited to infection, bleeding, scarring, stiffness, nerve injury, tendon injury, vascular injury, hardware complication, recurrence of symptoms and need for subsequent operation.  We also discussed the specifics of the postoperative protocol and the appropriate timeline.  Patient expressed understanding.   Follow-up: No follow-ups on file.   Meds & Orders: No orders of the defined types were placed in this encounter.   Orders Placed This Encounter  Procedures   XR Wrist Complete Left   XR Wrist Complete Right     Procedures: No procedures performed      Clinical History: No specialty comments available.  She reports that she has never smoked. She has been exposed to tobacco smoke. She has never used smokeless tobacco.  Recent Labs    05/22/23 0905 12/11/23 1448  HGBA1C 6.4 6.2    Objective:   Vital Signs: There were no vitals taken for this visit.  Physical Exam  Gen: Well-appearing, in no acute distress; non-toxic CV: Regular Rate. Well-perfused. Warm.  Resp: Breathing unlabored on room air; no wheezing. Psych: Fluid speech in  conversation; appropriate affect; normal thought process  Ortho  Exam General: Patient is well appearing and in no distress.   Skin and Muscle: No skin changes are apparent to upper extremities.  Muscle bulk and contour normal, no signs of atrophy.      Range of Motion and Palpation Tests: Mobility is full about the elbows with flexion and extension.  Forearm supination and pronation are 85/85 bilaterally.  Wrist flexion/extension is 75/65 bilaterally.  Digital flexion and extension are full.  Thumb opposition is full to the base of the small fingers bilaterally.     No cords or nodules are palpated.  No triggering is observed.     Significant tenderness over the bilateral thumb CMC articulation is observed, positive grind for pain, positive crepitus.  MP hyperextension negative.    Finklestein test positive bilateral   Neurologic, Vascular, Motor: Sensation is intact to light touch in the median/radial/ulnar distributions.  Tinel's testing negative at wrist level. Phalen's negative, Derkan's compression negative.  Fingers pink and well perfused.  Capillary refill is brisk.     Imaging: No results found.  Past Medical/Family/Surgical/Social History: Medications & Allergies reviewed per EMR, new medications updated. Patient Active Problem List   Diagnosis Date Noted   Trace edema 01/21/2024   Hyperlipidemia 12/11/2023   Prediabetes 05/22/2023   Paresthesia 05/22/2023   Rhinosinusitis 03/20/2023   BMI 39.0-39.9,adult 03/20/2023   Fuchs' corneal dystrophy of both eyes 11/05/2020   Constipation 08/21/2020   H/O total hysterectomy 11/16/2018   Multinodular goiter 07/25/2016   Subclinical hyperthyroidism 07/26/2015   DJD (degenerative joint disease), lumbar 12/29/2014   Adnexal mass 04/21/2013   Osteoarthritis of left knee 02/11/2013   Cramps, extremity 03/09/2012   Vertigo 02/07/2011   GERD 01/17/2009   HYPERTENSION, BENIGN SYSTEMIC 08/27/2006   Allergic rhinitis 08/27/2006   Past Medical History:  Diagnosis Date   Allergy     Arthritis    GERD (gastroesophageal reflux disease)    Heart murmur    Heart murmur 02/01/2013   History of osteoarthritis    left knee   Hypertension    under control with med., has been on med. x 5 yr.   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   Recurrent tonsillitis 12/01/2016   Sinus headache 10/30/2015   Vertigo 02/07/2011   Wears partial dentures    upper   Family History  Problem Relation Age of Onset   Diabetes Mother    Hypertension Mother    Hyperlipidemia Mother    Colon polyps Mother    Emphysema Father    Breast cancer Cousin 82   Colon polyps Sister    Esophageal cancer Brother    Colon cancer Neg Hx    Rectal cancer Neg Hx    Stomach cancer Neg Hx    Past Surgical History:  Procedure Laterality Date   CATARACT EXTRACTION Right 01/2020   CATARACT EXTRACTION Left 11/2019   COLONOSCOPY  2009   normal    CORNEAL TRANSPLANT Right 2022   CYST EXCISION  03/25/2007   resection peritoneal inclusion cyst   HERNIA REPAIR     JOINT REPLACEMENT Left    knee   KNEE ARTHROSCOPY Left  2011   KNEE ARTHROSCOPY Right 10/21/2013   Procedure: ARTHROSCOPY RIGHT KNEE;  Surgeon: Norleen LITTIE Gavel, MD;  Location: [facility];  Service: Orthopedics;  Laterality: Right;  medial , lateral and patella femoral chondromalsia and medial plica   LAPAROSCOPY N/A 04/21/2013   Procedure: LAPAROSCOPY DIAGNOSTIC;  Surgeon: Elveria Mungo, MD;  Location: [facility];  Service: Gynecology;  Laterality: N/A;   LAPAROTOMY N/A 04/21/2013   Procedure: LAPAROTOMY;  Surgeon: Elveria Mungo, MD;  Location: [facility];  Service: Gynecology;  Laterality: N/A;   LYSIS OF ADHESION  08/28/2005;  03/25/2007; 04/21/2013   POLYPECTOMY     SALPINGOOPHORECTOMY Left 04/21/2013   Procedure: SALPINGO OOPHORECTOMY;  Surgeon: Elveria Mungo, MD;  Location: [facility];  Service: Gynecology;  Laterality: Left;   TOTAL ABDOMINAL HYSTERECTOMY  08/28/2005   TOTAL KNEE ARTHROPLASTY Left 02/11/2013   Procedure: LEFT TOTAL KNEE ARTHROPLASTY;  Surgeon: Norleen LITTIE Gavel, MD;  Location: [facility];  Service: Orthopedics;  Laterality: Left;  NO COMPUTER   UMBILICAL HERNIA REPAIR  1984   UNILATERAL SALPINGECTOMY Right 08/28/2005   Social History   Occupational History   Occupation: Arts development officer  Tobacco Use   Smoking status: Never    Passive exposure: Past (as a child, father, then 19 yrs at work)   Smokeless tobacco: Never  Vaping Use   Vaping status: Never Used  Substance and Sexual Activity   Alcohol use: Never   Drug use: Never   Sexual activity: Yes    Birth control/protection: Surgical    Nagee Goates Estela) Arlinda, M.D. [facility], Hand Surgery

## 2024-03-29 ENCOUNTER — Ambulatory Visit: Payer: Self-pay | Admitting: Family Medicine

## 2024-03-31 ENCOUNTER — Other Ambulatory Visit: Payer: Self-pay | Admitting: Family Medicine

## 2024-04-01 ENCOUNTER — Encounter: Payer: Self-pay | Admitting: Family Medicine

## 2024-04-01 ENCOUNTER — Ambulatory Visit (INDEPENDENT_AMBULATORY_CARE_PROVIDER_SITE_OTHER): Payer: Self-pay | Admitting: Family Medicine

## 2024-04-01 VITALS — BP 157/93 | HR 68 | Wt 256.4 lb

## 2024-04-01 DIAGNOSIS — Z0000 Encounter for general adult medical examination without abnormal findings: Secondary | ICD-10-CM

## 2024-04-01 DIAGNOSIS — Z6841 Body Mass Index (BMI) 40.0 and over, adult: Secondary | ICD-10-CM | POA: Diagnosis not present

## 2024-04-01 DIAGNOSIS — I10 Essential (primary) hypertension: Secondary | ICD-10-CM

## 2024-04-01 DIAGNOSIS — R10A1 Flank pain, right side: Secondary | ICD-10-CM

## 2024-04-01 DIAGNOSIS — R7303 Prediabetes: Secondary | ICD-10-CM

## 2024-04-01 LAB — POCT GLYCOSYLATED HEMOGLOBIN (HGB A1C): HbA1c, POC (prediabetic range): 6.4 % (ref 5.7–6.4)

## 2024-04-01 NOTE — Progress Notes (Signed)
    SUBJECTIVE:   CHIEF COMPLAINT / HPI:   Discussed the use of AI scribe software for clinical note transcription with the patient, who gave verbal consent to proceed.  History of Present Illness   Cynthia Wilson is a 57 year old female with hyperthyroidism who presents with right flank pain.  She has been experiencing right flank pain since Tuesday, rating it as a 4 to 5 out of 10, which varies with movement. The pain was initially intense but has been improving. She thinks she might have pulled a muscle, possibly from weight lifting. She has been managing the pain with Tylenol  and ibuprofen , taking 800 mg of ibuprofen  this morning, which provided relief. Baclofen , a muscle relaxant, is available at home but has not been used yet. No trauma or injury is associated with the flank pain.  She has a history of hyperthyroidism and was previously taking phentermine  for weight management, which she discontinued three to four weeks ago due to concerns about its impact on her blood pressure and heart rate. She has not been checking her blood pressure at home regularly but recalls it was good during her last visit.  She experiences stress related to caring for her mother and her husband's illness, which she manages through regular exercise. She exercises four times a week for about an hour to an hour and a half each session, which she finds helpful for stress relief. She reports soreness after workouts, which is unusual for her, as she typically does not experience soreness despite regular exercise.  She has a history of weight management challenges and has previously used metformin , which she found ineffective for weight loss. She has not worked with a dietitian recently and last did so several years ago.       PERTINENT  PMH / PSH: PMHx reviewed  OBJECTIVE:   BP (!) 157/93   Pulse 68   Wt 256 lb 6.4 oz (116.3 kg)   SpO2 99%   BMI 41.38 kg/m   Physical Exam   VITALS: BP-  157/93 CHEST: Lungs clear to auscultation, no wheezing. CARDIOVASCULAR: Heart regular rate and rhythm, no murmurs. ABDOMEN: No costovertebral angle tenderness. Lumbar ROM intact NEURO: No deficit       ASSESSMENT/PLAN:   Assessment & Plan BMI 40.0-44.9, adult (HCC) Off phentermine  due to potential increase in blood pressure and heart rate. Insurance coverage issues with Wegovy  and Mounjaro . Previously used metformin  without significant weight loss. Olistat discussed as less likely to interact with hyperthyroidism. - Provide information on Olistat for review. - Send referral to dietitian for nutritional support. - Check insurance coverage for Mounjaro  and Ozempic . - Consider Olistat if she decides to proceed.  HYPERTENSION, BENIGN SYSTEMIC Blood pressure elevated at 157/93 mmHg. Declined increase in lisinopril  dosage from 10 mg to 20 mg at this time. - Recheck blood pressure in 4 weeks. - Encourage dietary modifications including increased fruits and vegetables. - Continue current exercise regimen. Prediabetes A1c is 6.4%, indicating prediabetes. Discussed importance of preventing progression to diabetes. - Monitor A1c levels. - Encourage lifestyle modifications to prevent progression to diabetes.  Right flank pain   Right flank pain Likely musculoskeletal due to possible muscle strain. Pain improves with ibuprofen . - Use baclofen  as needed for muscle relaxation. - Use ibuprofen  400 mg as needed for pain management.   Otto Fairly, MD [facility] [place]

## 2024-04-01 NOTE — Assessment & Plan Note (Addendum)
 Blood pressure elevated at 157/93 mmHg. Declined increase in lisinopril  dosage from 10 mg to 20 mg at this time. - Recheck blood pressure in 4 weeks. - Encourage dietary modifications including increased fruits and vegetables. - Continue current exercise regimen.

## 2024-04-01 NOTE — Patient Instructions (Signed)
 Orlistat Capsules What is this medication? ORLISTAT (OR li stat) promotes weight loss. It may also be used to maintain weight loss. It works by decreasing the amount of fat your body absorbs from food. Changes to diet and exercise are often combined with this medication. This medicine may be used for other purposes; ask your health care provider or pharmacist if you have questions. COMMON BRAND NAME(S): alli, Xenical What should I tell my care team before I take this medication? They need to know if you have any of these conditions: An eating disorder, such as anorexia or bulimia Diabetes Gallbladder disease History of irregular heart beat HIV or AIDS Kidney stones Liver disease Organ transplant Pancreatic disease Problems absorbing food Seizures Stomach or intestine problems Take medications that treat or prevent blood clots Thyroid  disease An unusual or allergic reaction to orlistat, other medications, foods, dyes, or preservatives Pregnant or trying to get pregnant Breast-feeding How should I use this medication? Take this medication by mouth with a glass of water. Follow the directions on the prescription or product label. Take this medication with each main meal that contains about 30 percent of the calories from fat or within 1 hour after each meal. Do not take your medication more often than directed. If you occasionally miss a meal or have a meal without fat, you can skip that dose of this medication. Talk to your care team about the use of this medication in children. Special care may be needed. While this medication may be prescribed for children as young as 12 years for selected conditions, precautions do apply. Use of this medication without a prescription is not approved in children less than 18 years. Overdosage: If you think you have taken too much of this medicine contact a poison control center or emergency room at once. NOTE: This medicine is only for you. Do not share  this medicine with others. What if I miss a dose? If you miss a dose, take it within 1 hour following the meal that contains fat. If it is almost time for your next dose, take only that dose. Do not take double or extra doses. If you occasionally miss a meal or have a meal without fat, you can skip that dose of this medication. What may interact with this medication? Amiodarone Certain antivirals for HIV or hepatitis Certain medications that treat or prevent blood clots like warfarin, enoxaparin, dalteparin, apixaban, dabigatran, edoxaban, and rivaroxaban Cyclosporine Medications for diabetes Medications for seizures Other medications or herbal and dietary supplements for weight loss Supplements like vitamins A, beta-carotene, D, E and K Thyroid  hormones Warfarin This list may not describe all possible interactions. Give your health care provider a list of all the medicines, herbs, non-prescription drugs, or dietary supplements you use. Also tell them if you smoke, drink alcohol, or use illegal drugs. Some items may interact with your medicine. What should I watch for while using this medication? Visit your care team for regular checks on your progress. This medication can cause decreased absorption of some vitamins. You should take a daily multivitamin that contains normal amounts of vitamins D, E, K and beta-carotene or vitamin A. Take the multivitamin once per day at bedtime unless otherwise directed by your care team. Discuss the foods you eat and the vitamins you take with your care team. You should use this medication with a reduced-calorie diet that contains no more than about 30 percent of the calories from fat. Divide your daily intake of fat, carbohydrates, and  protein evenly over your 3 main meals. Follow a well-balanced, reduced-calorie, low fat diet. Try starting this diet before taking this medication. Following a low fat diet can help reduce the possible side effects from this  medication. Tell your care team if you wish to become pregnant or think you might be pregnant. Losing weight while pregnant is not advised and may cause harm to the unborn child. Talk to your care team for more information. Do not use this medication if you have had an organ transplant. This medication interferes with some medications used to prevent transplant rejection. What side effects may I notice from receiving this medication? Side effects that you should report to your care team as soon as possible: Allergic reactions--skin rash, itching, hives, swelling of the face, lips, tongue, or throat Gallbladder problems--severe stomach pain, nausea, vomiting, fever Kidney injury--decrease in the amount of urine, swelling of the ankles, hands, or feet Kidney stones--blood in the urine, pain or trouble passing urine, pain in the lower back or sides Liver injury--right upper belly pain, loss of appetite, nausea, light-colored stool, dark yellow or brown urine, yellowing skin or eyes, unusual weakness or fatigue Side effects that usually do not require medical attention (report to your care team if they continue or are bothersome): Diarrhea Gas Oily rectal discharge or stool This list may not describe all possible side effects. Call your doctor for medical advice about side effects. You may report side effects to FDA at 1-800-FDA-1088. Where should I keep my medication? Keep out of the reach of children and pets. Storage at room temperature between 20 and 25 degrees C (68 and 77 degrees F). Keep container tightly closed. Throw away any unused medication after the expiration date. NOTE: This sheet is a summary. It may not cover all possible information. If you have questions about this medicine, talk to your doctor, pharmacist, or health care provider.  2024 Elsevier/Gold Standard (2021-08-14 00:00:00)

## 2024-04-01 NOTE — Assessment & Plan Note (Addendum)
 A1c is 6.4%, indicating prediabetes. Discussed importance of preventing progression to diabetes. - Monitor A1c levels. - Encourage lifestyle modifications to prevent progression to diabetes.

## 2024-04-05 ENCOUNTER — Encounter: Payer: Self-pay | Admitting: Family Medicine

## 2024-04-05 ENCOUNTER — Encounter: Payer: Self-pay | Admitting: Orthopedic Surgery

## 2024-04-06 MED ORDER — WEGOVY 0.25 MG/0.5ML ~~LOC~~ SOAJ: 0.2500 mg | SUBCUTANEOUS | 0 refills | Status: AC

## 2024-04-07 ENCOUNTER — Other Ambulatory Visit (HOSPITAL_COMMUNITY): Payer: Self-pay

## 2024-04-09 ENCOUNTER — Other Ambulatory Visit: Payer: Self-pay | Admitting: Family Medicine

## 2024-04-11 ENCOUNTER — Other Ambulatory Visit: Payer: Self-pay | Admitting: Family Medicine

## 2024-04-11 MED ORDER — LISINOPRIL 10 MG PO TABS: 10.0000 mg | ORAL_TABLET | Freq: Every day | ORAL | 1 refills | Status: AC

## 2024-05-02 ENCOUNTER — Encounter: Payer: Self-pay | Admitting: Radiology

## 2024-05-11 ENCOUNTER — Encounter: Payer: Self-pay | Admitting: Family Medicine

## 2024-05-12 NOTE — Telephone Encounter (Signed)
 Patient is scheduled with Dr.Mabe on 11/20 @210 

## 2024-05-18 NOTE — Progress Notes (Signed)
 Medical Nutrition Therapy  Appointment Start time:  1400  Appointment End time:  1505  Primary concerns today: healthy eating   Referral diagnosis: Morbid (severe) obesity due to excess calories  Preferred learning style:  no preference indicated Learning readiness: ready-change in progress  NUTRITION ASSESSMENT   Clinical Medical Hx:  Past Medical History:  Diagnosis Date   Allergy    Arthritis    GERD (gastroesophageal reflux disease)    Heart murmur    Heart murmur 02/01/2013   History of osteoarthritis    left knee   Hypertension    under control with med., has been on med. x 5 yr.   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   Recurrent tonsillitis 12/01/2016   Sinus headache 10/30/2015   Vertigo 02/07/2011   Wears partial dentures    upper    Medications:  Current Outpatient Medications:    acetaminophen  (TYLENOL ) 500 MG tablet, Take 500 mg by mouth every 6 (six) hours as needed., Disp: , Rfl:    baclofen  (LIORESAL ) 10 MG tablet, TAKE 1 TABLET BY MOUTH AT BEDTIME AS NEEDED FOR MUSCLE SPASMS (Patient not taking: Reported on 04/01/2024), Disp: 90 tablet, Rfl: 1   cetirizine (ZYRTEC) 10 MG tablet, Take 10 mg by mouth daily., Disp: , Rfl:    diclofenac  sodium (VOLTAREN ) 1 % GEL, Apply 4 g to your back four times daily as needed for pain, Disp: 100 g, Rfl: 0   esomeprazole  (NEXIUM ) 20 MG capsule, Take 1 capsule (20 mg total) by mouth daily. (Patient not taking: Reported on 03/11/2024), Disp: 90 capsule, Rfl: 1   fluticasone  (FLONASE ) 50 MCG/ACT nasal spray, Place 2 sprays into both nostrils daily., Disp: 15.8 mL, Rfl: 2   guaiFENesin  (MUCINEX ) 600 MG 12 hr tablet, Take 600 mg by mouth 2  (two) times daily as needed., Disp: , Rfl:    ibuprofen  (ADVIL ) 400 MG tablet, Take 1 tablet (400 mg total) by mouth every 8 (eight) hours as needed., Disp: 60 tablet, Rfl: 1   lisinopril  (ZESTRIL ) 10 MG tablet, Take 1 tablet (10 mg total) by mouth daily., Disp: 90 tablet, Rfl: 1   lubiprostone  (AMITIZA ) 8 MCG capsule, Take 1 capsule (8 mcg total) by mouth 2 (two) times daily with a meal. (Patient not taking: Reported on 03/11/2024), Disp: 60 capsule, Rfl: 5   meclizine  (ANTIVERT ) 25 MG tablet, TAKE 1 TABLET (25 MG TOTAL) BY MOUTH 2 (TWO) TIMES DAILY AS NEEDED (VERTIGO)., Disp: 30 tablet, Rfl: 0   methimazole  (TAPAZOLE ) 5 MG tablet, Take 1 tablet (5 mg total) by mouth 3 (three) times daily., Disp: 60 tablet, Rfl: 3   Multiple Vitamin (MULTIVITAMIN) tablet, Take 1 tablet by mouth daily. Womens Ultra Mega Vitamin, Disp: , Rfl:    prednisoLONE acetate (PRED FORTE) 1 % ophthalmic suspension, Place 1 drop into both eyes daily., Disp: , Rfl:    rosuvastatin  (CRESTOR ) 5 MG tablet, Take 1 tablet (5 mg total) by mouth daily., Disp: 90 tablet, Rfl: 1   semaglutide -weight management (WEGOVY ) 0.25 MG/0.5ML SOAJ SQ injection, Inject 0.25 mg into the skin once a week. (Patient not taking: Reported on 05/25/2024), Disp: 2 mL, Rfl: 0   Zinc  50 MG TABS, Take 50 mg by mouth daily. (Patient not taking: Reported on 05/25/2024), Disp: , Rfl:   Labs:  Lab Results  Component Value Date   HGBA1C 6.4 04/01/2024   BP Readings from Last 3 Encounters:  05/19/24 136/82  04/01/24 (!) 157/93  03/11/24 135/80   Notable Signs/Symptoms:  Wt Readings from Last 3 Encounters:  05/19/24 247 lb 3.2 oz (112.1 kg)  04/01/24 256 lb 6.4 oz (116.3 kg)  03/11/24 249 lb 12.8 oz (113.3 kg)   Lifestyle & Dietary Hx Pt presents today alone for initial assessment. Pt would like to learn more about portions of healthy. 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  Pt reports she does the  cooking and shopping. Pt reports eating out twice weekly. Pt reports her appetite is well controlled stating she is not hungry at times. Pt reports she is taking ozempic  weekly and got this medication from a friend and is unsure how long she will have access. Pt reports she was meal planning in the past and found this helpful.  Pt reports aiming for  Pt reports  All Pt's questions were answered during this encounter.   Estimated daily fluid intake: not enough 50+ oz Supplements: MVI, B complex with C, oxy shred, BCAA Sleep: good 6-7 average hours nightly  Stress / self-care: 5 out of 10 / self care includes: gym,time alone  Current average weekly physical activity: 3-4 days weekly, weights, elliptical, cardio for 1 hour   24-Hr Dietary Recall First Meal: skips 1-2 d/w or bacon, eggs, coffee with flavored creamer or premier protein, 2 teaspoons sugar, water Snack: none Second Meal: skips 5/d/w or ~12:30 pm: Stouffers frozen meal or peanut butter, jelly, 2 slices of white bread, water or flavored water Snack: ~ 1 pm: nabs or flavored yogurt, 20 almonds, 2 tbsp granola Third Meal: 1/2 pack oodles of noodles, crackers, water or 3 slices of white bread, bologna, water or ghassans grilled chicken greek salad, crackers or chicken or pork chops, green beans, rice or corn, flavored water Snack: 2 handfuls chips or cheese doodles  Beverages: water or flavored water, coffee with flavored creamer, 2 teaspoons sugar   NUTRITION DIAGNOSIS  NB-1.1 Food and nutrition-related knowledge deficit As related to no prior nutrition related education.  As evidenced by Pt reports and dietary recall.  NUTRITION INTERVENTION  Nutrition education (E-1) on the following topics:  Fruits & Vegetables: Aim to fill half your plate with a variety of fruits and vegetables. They are rich in vitamins, minerals, and fiber, and can help reduce the risk of chronic diseases. Choose a colorful assortment of fruits and vegetables  to ensure you get a wide range of nutrients. Grains and Starches: Make at least half of your grain choices whole grains, such as brown rice, whole wheat bread, and oats. Whole grains provide fiber, which aids in digestion and healthy cholesterol levels. Aim for whole forms of starchy vegetables such as potatoes, sweet potatoes, beans, peas, and corn, which are fiber rich and provide many vitamins and minerals.  Protein: Incorporate lean sources of protein, such as poultry, fish, beans, nuts, and seeds, into your meals. Protein is essential for building and repairing tissues, staying full, balancing blood sugar, as well as supporting immune function. Dairy: Include low-fat or fat-free dairy products like milk, yogurt, and cheese in your diet. Dairy foods are excellent sources of calcium  and vitamin D , which are crucial for bone health.  Physical Activity: Aim for 60 minutes of physical activity daily. Regular physical activity promotes overall health-including helping to reduce risk for heart disease and diabetes, promoting mental health, and helping us  sleep better.   Handouts Provided Include  Plate Planner- [employer] Move Your Way-[facility] Carbohydrate controlled snacks ideas  Learning Style & Readiness for Change Teaching method utilized: Visual & Auditory  Demonstrated degree of understanding via: Teach Back  Barriers to learning/adherence to lifestyle change: time management  Goals Established by Pt Great job with physical activity! Increase non starchy vegetables on half your 9 inch plate Aim to avoid skipping meals   MONITORING & EVALUATION Dietary intake, weekly physical activity  Next Steps  Patient is to return PRN.

## 2024-05-19 ENCOUNTER — Ambulatory Visit (INDEPENDENT_AMBULATORY_CARE_PROVIDER_SITE_OTHER): Payer: Self-pay | Admitting: Family Medicine

## 2024-05-19 ENCOUNTER — Encounter: Payer: Self-pay | Admitting: Family Medicine

## 2024-05-19 VITALS — BP 136/82 | HR 68 | Ht 66.0 in | Wt 247.2 lb

## 2024-05-19 DIAGNOSIS — S39012A Strain of muscle, fascia and tendon of lower back, initial encounter: Secondary | ICD-10-CM | POA: Diagnosis not present

## 2024-05-19 DIAGNOSIS — R252 Cramp and spasm: Secondary | ICD-10-CM | POA: Diagnosis not present

## 2024-05-19 NOTE — Progress Notes (Signed)
   SUBJECTIVE:   CHIEF COMPLAINT / HPI:  Discussed the use of AI scribe software for clinical note transcription with the patient, who gave verbal consent to proceed.  History of Present Illness Cynthia Wilson is a 58 year old female who presents with leg cramps.  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 - Muscle relaxer taken for relief  Exercise and hydration - Consistent exercise routine - Heavy sweating during workouts - Cramps initially did not coincide with exercise but have become more frequent and consecutive - Electrolyte and BCAA drinks used during workouts, reducing cramp frequency to one or two days in the past week - Occasional stretching after workouts, but not always  Self-management and concerns - Previously tried mustard, Gatorade, and salt for cramp relief but discontinued due to blood pressure concerns - Concerned about low potassium levels and dehydration   OBJECTIVE:  BP 136/82   Pulse 68   Ht 5' 6 (1.676 m)   Wt 247 lb 3.2 oz (112.1 kg)   SpO2 99%   BMI 39.90 kg/m   Physical Exam GENERAL: Alert, cooperative, well developed, no acute distress EXTREMITIES: No cyanosis or edema, no abnormal findings of the bilateral legs without TTP NEUROLOGICAL: Cranial nerves grossly intact, moves all extremities without gross motor or sensory deficit, normal gait  ASSESSMENT/PLAN:   Assessment & Plan Leg cramps Intermittent cramps likely due to electrolyte imbalance, improved with supplementation and hydration. - Ordered metabolic panel for electrolytes, including potassium and magnesium . - Ordered iron and vitamin D  as  these can also play a role in cramps - Advised electrolyte supplementation to no more than 2-3 times weekly to avoid renal dysfunction, emphasized hydration. - Provided stretching exercises for pre- and post-exercise. - Consider evaluation for PAD if symptoms persist - Instructed to update via MyChart if symptoms persist. Back strain, initial encounter Likely cause of mild, though improved, MSK pain on the lateral left back. - Continue muscle relaxer prn  Stuart Redo, MD [facility]

## 2024-05-19 NOTE — Patient Instructions (Addendum)
 We will collect labs today to examine causes of your leg.   I have added stretches for your legs here: [email or web address]. Be sure to NOT do these if you are having worsening pain or if you feel off balance.  Come back if these continue.

## 2024-05-20 ENCOUNTER — Ambulatory Visit: Payer: Self-pay | Admitting: Family Medicine

## 2024-05-20 LAB — IRON,TIBC AND FERRITIN PANEL
Ferritin: 277 ng/mL — ABNORMAL HIGH (ref 15–150)
Iron Saturation: 17 % (ref 15–55)
Iron: 47 ug/dL (ref 27–159)
Total Iron Binding Capacity: 272 ug/dL (ref 250–450)
UIBC: 225 ug/dL (ref 131–425)

## 2024-05-20 LAB — COMPREHENSIVE METABOLIC PANEL WITH GFR
ALT: 15 IU/L (ref 0–32)
AST: 25 IU/L (ref 0–40)
Albumin: 4 g/dL (ref 3.8–4.9)
Alkaline Phosphatase: 80 IU/L (ref 49–135)
BUN/Creatinine Ratio: 11 — ABNORMAL LOW (ref 12–28)
BUN: 12 mg/dL (ref 8–27)
Bilirubin Total: 0.3 mg/dL (ref 0.0–1.2)
CO2: 24 mmol/L (ref 20–29)
Calcium: 9.3 mg/dL (ref 8.7–10.3)
Chloride: 106 mmol/L (ref 96–106)
Creatinine, Ser: 1.06 mg/dL — ABNORMAL HIGH (ref 0.57–1.00)
Globulin, Total: 2.4 g/dL (ref 1.5–4.5)
Glucose: 85 mg/dL (ref 70–99)
Potassium: 4.2 mmol/L (ref 3.5–5.2)
Sodium: 144 mmol/L (ref 134–144)
Total Protein: 6.4 g/dL (ref 6.0–8.5)
eGFR: 60 mL/min/1.73 (ref >59)

## 2024-05-20 LAB — MAGNESIUM: Magnesium: 2.1 mg/dL (ref 1.6–2.3)

## 2024-05-20 LAB — VITAMIN D 25 HYDROXY (VIT D DEFICIENCY, FRACTURES): Vit D, 25-Hydroxy: 38.2 ng/mL (ref 30.0–100.0)

## 2024-05-25 ENCOUNTER — Encounter: Payer: Self-pay | Attending: Family Medicine | Admitting: Dietician

## 2024-05-31 ENCOUNTER — Encounter: Payer: Self-pay | Admitting: Family Medicine

## 2024-05-31 NOTE — Telephone Encounter (Signed)
 Patient is coming in to be seen on 12/5 @230 

## 2024-06-03 ENCOUNTER — Encounter: Payer: Self-pay | Admitting: Family Medicine

## 2024-06-03 ENCOUNTER — Ambulatory Visit: Admitting: Family Medicine

## 2024-06-03 VITALS — BP 151/93 | HR 76 | Ht 66.0 in | Wt 246.4 lb

## 2024-06-03 DIAGNOSIS — Z23 Encounter for immunization: Secondary | ICD-10-CM | POA: Diagnosis not present

## 2024-06-03 DIAGNOSIS — I10 Essential (primary) hypertension: Secondary | ICD-10-CM | POA: Diagnosis not present

## 2024-06-03 DIAGNOSIS — M549 Dorsalgia, unspecified: Secondary | ICD-10-CM

## 2024-06-03 MED ORDER — IBUPROFEN 400 MG PO TABS: 400.0000 mg | ORAL_TABLET | Freq: Two times a day (BID) | ORAL | 1 refills | Status: AC | PRN

## 2024-06-03 NOTE — Assessment & Plan Note (Addendum)
 Elevated blood pressure readings. No recent home monitoring. - BP repeated multiple times by me during this visit improved - BP during last visit was 136/83 - Encouraged home blood pressure monitoring. - Scheduled follow-up in four weeks for blood pressure management and potential medication adjustment.

## 2024-06-03 NOTE — Patient Instructions (Signed)
 Back Exercises These exercises help to make your trunk and back strong. They also help to keep the lower back flexible. Doing these exercises can help to prevent or lessen pain in your lower back. If you have back pain, try to do these exercises 2-3 times each day or as told by your doctor. As you get better, do the exercises once each day. Repeat the exercises more often as told by your doctor. To stop back pain from coming back, do the exercises once each day, or as told by your doctor. Do exercises exactly as told by your doctor. Stop right away if you feel sudden pain or your pain gets worse. Exercises Single knee to chest Do these steps 3-5 times in a row for each leg: Lie on your back on a firm bed or the floor with your legs stretched out. Bring one knee to your chest. Grab your knee or thigh with both hands and hold it in place. Pull on your knee until you feel a gentle stretch in your lower back or butt. Keep doing the stretch for 10-30 seconds. Slowly let go of your leg and straighten it. Pelvic tilt Do these steps 5-10 times in a row: Lie on your back on a firm bed or the floor with your legs stretched out. Bend your knees so they point up to the ceiling. Your feet should be flat on the floor. Tighten your lower belly (abdomen) muscles to press your lower back against the floor. This will make your tailbone point up to the ceiling instead of pointing down to your feet or the floor. Stay in this position for 5-10 seconds while you gently tighten your muscles and breathe evenly. Cat-cow Do these steps until your lower back bends more easily: Get on your hands and knees on a firm bed or the floor. Keep your hands under your shoulders, and keep your knees under your hips. You may put padding under your knees. Let your head hang down toward your chest. Tighten (contract) the muscles in your belly. Point your tailbone toward the floor so your lower back becomes rounded like the back of a  cat. Stay in this position for 5 seconds. Slowly lift your head. Let the muscles of your belly relax. Point your tailbone up toward the ceiling so your back forms a sagging arch like the back of a cow. Stay in this position for 5 seconds.  Press-ups Do these steps 5-10 times in a row: Lie on your belly (face-down) on a firm bed or the floor. Place your hands near your head, about shoulder-width apart. While you keep your back relaxed and keep your hips on the floor, slowly straighten your arms to raise the top half of your body and lift your shoulders. Do not use your back muscles. You may change where you place your hands to make yourself more comfortable. Stay in this position for 5 seconds. Keep your back relaxed. Slowly return to lying flat on the floor.  Bridges Do these steps 10 times in a row: Lie on your back on a firm bed or the floor. Bend your knees so they point up to the ceiling. Your feet should be flat on the floor. Your arms should be flat at your sides, next to your body. Tighten your butt muscles and lift your butt off the floor until your waist is almost as high as your knees. If you do not feel the muscles working in your butt and the back of  your thighs, slide your feet 1-2 inches (2.5-5 cm) farther away from your butt. Stay in this position for 3-5 seconds. Slowly lower your butt to the floor, and let your butt muscles relax. If this exercise is too easy, try doing it with your arms crossed over your chest. Belly crunches Do these steps 5-10 times in a row: Lie on your back on a firm bed or the floor with your legs stretched out. Bend your knees so they point up to the ceiling. Your feet should be flat on the floor. Cross your arms over your chest. Tip your chin a little bit toward your chest, but do not bend your neck. Tighten your belly muscles and slowly raise your chest just enough to lift your shoulder blades a tiny bit off the floor. Avoid raising your body  higher than that because it can put too much stress on your lower back. Slowly lower your chest and your head to the floor. Back lifts Do these steps 5-10 times in a row: Lie on your belly (face-down) with your arms at your sides, and rest your forehead on the floor. Tighten the muscles in your legs and your butt. Slowly lift your chest off the floor while you keep your hips on the floor. Keep the back of your head in line with the curve in your back. Look at the floor while you do this. Stay in this position for 3-5 seconds. Slowly lower your chest and your face to the floor. Contact a doctor if: Your back pain gets a lot worse when you do an exercise. Your back pain does not get better within 2 hours after you exercise. If you have any of these problems, stop doing the exercises. Do not do them again unless your doctor says it is okay. Get help right away if: You have sudden, very bad back pain. If this happens, stop doing the exercises. Do not do them again unless your doctor says it is okay. This information is not intended to replace advice given to you by your health care provider. Make sure you discuss any questions you have with your health care provider. Document Revised: 08/29/2020 Document Reviewed: 08/29/2020 Elsevier Patient Education  2024 [employer].

## 2024-06-03 NOTE — Progress Notes (Signed)
    SUBJECTIVE:   CHIEF COMPLAINT / HPI:   Discussed the use of AI scribe software for clinical note transcription with the patient, who gave verbal consent to proceed.  History of Present Illness   Cynthia Wilson is a 58 year old female with hypertension who presents with back pain.  She has been experiencing back pain since last Saturday/6 days ago, localized to the left shoulder blade area with occasional radiation to the back. The pain is intermittent, worsening in the evening and particularly bothersome at night, causing sleep disturbances. It is somewhat relieved by a lidocaine  patch, but muscle relaxants and Tylenol  have not provided significant relief. The pain occasionally affects her breathing, making deep inhalation painful. No recent trauma or injury.  Her current medications include lisinopril  10 mg daily for hypertension, baclofen  as needed for muscle relaxation, Tylenol  as needed, esomeprazole  as needed for acid reflux, and Crestor  5 mg daily for hyperlipidemia. She also uses prednisolone eye drops and has been taking Ozempic  0.25 mg weekly. She reports a weight loss of about three pounds since September.  Her son has experienced similar back pain, which he attributes to muscle relaxation at night. She has not checked her blood pressure at home recently but recalls previous elevated readings during past visits.       PERTINENT  PMH / PSH: PMHx reviewed  OBJECTIVE:   BP (!) 151/93   Pulse 76   Ht 5' 6 (1.676 m)   Wt 246 lb 6.4 oz (111.8 kg)   SpO2 100%   BMI 39.77 kg/m   Physical Exam   VITALS: BP- 158/93 MEASUREMENTS: Weight- 246. CHEST: Lungs clear to auscultation bilaterally. CARDIOVASCULAR: Heart regular rate and rhythm, no murmurs. MUSCULOSKELETAL: Tenderness over left trapezius muscle with tightness. Left shoulder & elbow normal range of motion, non-tender. Right shoulder and elbow normal range of motion, non-tender.     Results   LABS   Renal  function: elevated (04/2024) Mildly - repeat recommended in 4 weeks. Hydration encouraged and she agreed with the plan       ASSESSMENT/PLAN:   Assessment & Plan Upper back pain on left side Intermittent pain in left upper back and shoulder, worsens at night, tenderness over trapezius, no recent trauma. Pain improved with lidocaine  patch. Differential includes muscle strain. - Prescribed ibuprofen  400 mg every 12 hours as needed for pain. - Continue lidocaine  patch for relief. - Use muscle relaxant as needed, avoid when driving or operating machinery. - Apply Voltaren  gel as needed. - Provided home back exercises. - Consider x-ray if pain persists or worsens.  HYPERTENSION, BENIGN SYSTEMIC Elevated blood pressure readings. No recent home monitoring. - BP repeated multiple times by me during this visit improved - BP during last visit was 136/83 - Encouraged home blood pressure monitoring. - Scheduled follow-up in four weeks for blood pressure management and potential medication adjustment.    General Health Maintenance Discussed flu vaccination timing and administration. - Administered flu shot today. - Non-prescribed Ozempic  (counselled her and strongly suggested not using medications when not prescribed or monitored by PCP. She verbalized understanding). Otto Fairly, MD [facility]

## 2024-06-09 ENCOUNTER — Other Ambulatory Visit: Payer: Self-pay | Admitting: Internal Medicine

## 2024-06-21 ENCOUNTER — Encounter: Payer: Self-pay | Admitting: Family Medicine

## 2024-07-05 ENCOUNTER — Other Ambulatory Visit: Payer: Self-pay | Admitting: Family Medicine

## 2024-10-03 ENCOUNTER — Ambulatory Visit: Admitting: Internal Medicine
